# Patient Record
Sex: Female | Born: 1995
Health system: Southern US, Community
[De-identification: ages and names within clinical notes are randomized; demographics above are authoritative.]

## PROBLEM LIST (undated history)

## (undated) ENCOUNTER — Emergency Department: Admission: EM | Payer: BLUE CROSS/BLUE SHIELD | Source: Home / Self Care

## (undated) ENCOUNTER — Inpatient Hospital Stay: Payer: Self-pay

## (undated) DIAGNOSIS — A749 Chlamydial infection, unspecified: Secondary | ICD-10-CM

## (undated) DIAGNOSIS — F53 Postpartum depression: Secondary | ICD-10-CM

## (undated) DIAGNOSIS — R0600 Dyspnea, unspecified: Secondary | ICD-10-CM

## (undated) DIAGNOSIS — O98819 Other maternal infectious and parasitic diseases complicating pregnancy, unspecified trimester: Secondary | ICD-10-CM

## (undated) DIAGNOSIS — F419 Anxiety disorder, unspecified: Secondary | ICD-10-CM

## (undated) DIAGNOSIS — O99345 Other mental disorders complicating the puerperium: Secondary | ICD-10-CM

## (undated) DIAGNOSIS — Z7289 Other problems related to lifestyle: Secondary | ICD-10-CM

## (undated) HISTORY — DX: Other maternal infectious and parasitic diseases complicating pregnancy, unspecified trimester: O98.819

## (undated) HISTORY — PX: TONSILLECTOMY: SUR1361

## (undated) HISTORY — DX: Postpartum depression: F53.0

## (undated) HISTORY — DX: Other mental disorders complicating the puerperium: O99.345

## (undated) HISTORY — DX: Chlamydial infection, unspecified: A74.9

## (undated) HISTORY — DX: Other problems related to lifestyle: Z72.89

## (undated) HISTORY — DX: Anxiety disorder, unspecified: F41.9

---
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
## 2015-08-13 ENCOUNTER — Emergency Department (HOSPITAL_COMMUNITY)
Admission: EM | Admit: 2015-08-13 | Discharge: 2015-08-13 | Disposition: A | Discharge status: Home / Self Care | Payer: BLUE CROSS/BLUE SHIELD | Attending: Emergency Medicine | Admitting: Emergency Medicine

## 2015-08-13 ENCOUNTER — Encounter (HOSPITAL_COMMUNITY): Payer: Self-pay | Admitting: *Deleted

## 2015-08-13 DIAGNOSIS — R1013 Epigastric pain: Secondary | ICD-10-CM | POA: Diagnosis not present

## 2015-08-13 LAB — URINALYSIS, ROUTINE W REFLEX MICROSCOPIC
Bilirubin Urine: NEGATIVE
Glucose, UA: NEGATIVE mg/dL
Hgb urine dipstick: NEGATIVE
KETONES UR: NEGATIVE mg/dL
LEUKOCYTES UA: NEGATIVE
NITRITE: NEGATIVE
PH: 6 (ref 5.0–8.0)
Protein, ur: NEGATIVE mg/dL
SPECIFIC GRAVITY, URINE: 1.025 (ref 1.005–1.030)

## 2015-08-13 LAB — PREGNANCY, URINE: PREG TEST UR: NEGATIVE

## 2015-08-13 MED ORDER — OMEPRAZOLE 20 MG PO CPDR: 20.0000 mg | DELAYED_RELEASE_CAPSULE | Freq: Every day | ORAL | Status: DC

## 2015-08-13 MED ORDER — PANTOPRAZOLE SODIUM 40 MG PO TBEC
40.0000 mg | DELAYED_RELEASE_TABLET | Freq: Every day | ORAL | Status: DC
  Administered 2015-08-13: 40 mg via ORAL
  Filled 2015-08-13: qty 1

## 2015-08-13 MED ORDER — PANTOPRAZOLE SODIUM 20 MG PO TBEC
20.0000 mg | DELAYED_RELEASE_TABLET | Freq: Every day | ORAL | Status: DC
  Filled 2015-08-13: qty 1

## 2015-08-13 MED ORDER — GI COCKTAIL ~~LOC~~
30.0000 mL | Freq: Once | ORAL | Status: AC
  Administered 2015-08-13: 30 mL via ORAL
  Filled 2015-08-13: qty 30

## 2015-08-13 NOTE — Discharge Instructions (Signed)
You were seen today for abdominal pain. Your exam is reassuring. You did not have any persistent pain while in the emergency room. Given where your pain is, he may have some mild gastritis or reflux. This also could be the beginning of a viral illness. Placed on an acid reducer. If you develop any new or worsening symptoms she needs to be reevaluated.  Abdominal Pain, Adult Many things can cause abdominal pain. Usually, abdominal pain is not caused by a disease and will improve without treatment. It can often be observed and treated at home. Your health care provider will do a physical exam and possibly order blood tests and X-rays to help determine the seriousness of your pain. However, in many cases, more time must pass before a clear cause of the pain can be found. Before that point, your health care provider may not know if you need more testing or further treatment. HOME CARE INSTRUCTIONS Monitor your abdominal pain for any changes. The following actions may help to alleviate any discomfort you are experiencing:  Only take over-the-counter or prescription medicines as directed by your health care provider.  Do not take laxatives unless directed to do so by your health care provider.  Try a clear liquid diet (broth, tea, or water) as directed by your health care provider. Slowly move to a bland diet as tolerated. SEEK MEDICAL CARE IF:  You have unexplained abdominal pain.  You have abdominal pain associated with nausea or diarrhea.  You have pain when you urinate or have a bowel movement.  You experience abdominal pain that wakes you in the night.  You have abdominal pain that is worsened or improved by eating food.  You have abdominal pain that is worsened with eating fatty foods.  You have a fever. SEEK IMMEDIATE MEDICAL CARE IF:  Your pain does not go away within 2 hours.  You keep throwing up (vomiting).  Your pain is felt only in portions of the abdomen, such as the right  side or the left lower portion of the abdomen.  You pass bloody or black tarry stools. MAKE SURE YOU:  Understand these instructions.  Will watch your condition.  Will get help right away if you are not doing well or get worse.   This information is not intended to replace advice given to you by your health care provider. Make sure you discuss any questions you have with your health care provider.   Document Released: 01/13/2005 Document Revised: 12/25/2014 Document Reviewed: 12/13/2012 Elsevier Interactive Patient Education [employer]2016 Elsevier Inc.

## 2015-08-13 NOTE — ED Provider Notes (Signed)
CSN: [id number]     Arrival date & time 08/13/15  0130 History   First MD Initiated Contact with Patient 08/13/15 0146     Chief Complaint  Patient presents with  . Abdominal Pain     (Consider location/radiation/quality/duration/timing/severity/associated sxs/prior Treatment) HPI  This is a 20 year old female who presents with epigastric pain. Patient reports onset of symptoms 2-3 hours prior to arrival. She reports crampy upper abdominal pain that is in the midline. It is not associated with eating. It comes and goes. It lasts minutes. She has not taken anything at home for pain. Currently she is without pain. She denies any nausea, vomiting, diarrhea. She denies any fevers. Denies similar symptoms in the past.  History reviewed. No pertinent past medical history. Past Surgical History  Procedure Laterality Date  . Tonsillectomy     History reviewed. No pertinent family history. Social History  Substance Use Topics  . Smoking status: Never Smoker   . Smokeless tobacco: None  . Alcohol Use: No   OB History    No data available     Review of Systems  Constitutional: Negative for fever.  Respiratory: Negative for shortness of breath.   Cardiovascular: Negative for chest pain.  Gastrointestinal: Positive for abdominal pain. Negative for nausea, vomiting and diarrhea.  Genitourinary: Negative for dysuria.  All other systems reviewed and are negative.     Allergies  Amoxicillin  Home Medications   Prior to Admission medications   Medication Sig Start Date End Date Taking? Authorizing Provider  omeprazole (PRILOSEC) 20 MG capsule Take 1 capsule (20 mg total) by mouth daily. 08/13/15   Shon Batonourtney F Lamaj Metoyer, MD   BP 104/64 mmHg  Pulse 69  Temp(Src) 97.7 F (36.5 C)  Resp 18  Ht 5\' 1"  (1.549 m)  Wt 200 lb (90.719 kg)  BMI 37.81 kg/m2  SpO2 99%  LMP 07/09/2015 Physical Exam  Constitutional: She is oriented to person, place, and time. No distress.  Overweight  HENT:   Head: Normocephalic and atraumatic.  Cardiovascular: Normal rate, regular rhythm and normal heart sounds.   No murmur heard. Pulmonary/Chest: Effort normal and breath sounds normal. No respiratory distress. She has no wheezes.  Abdominal: Soft. Bowel sounds are normal. There is no tenderness. There is no rebound and no guarding.  Neurological: She is alert and oriented to person, place, and time.  Skin: Skin is warm and dry.  Psychiatric: She has a normal mood and affect.  Nursing note and vitals reviewed.   ED Course  Procedures (including critical care time) Labs Review Labs Reviewed  URINALYSIS, ROUTINE W REFLEX MICROSCOPIC (NOT [facility])  PREGNANCY, URINE    Imaging Review No results found. I have personally reviewed and evaluated these images and lab results as part of my medical decision-making.   EKG Interpretation None      MDM   Final diagnoses:  Epigastric pain    Patient presents with crampy upper abdominal pain. She is nontoxic on exam. Exam is benign. No reproducible abdominal tenderness. Vital signs are reassuring. Considerations include gastritis, GERD, or less likely biliary colic or pancreatitis. Discussed with patient possible plans of workup including screening urinalysis and urine pregnancy and a by mouth challenge versus screening lab work. Patient would like to defer lab work at this time. Urinalysis without evidence of dehydration. Urine pregnancy is negative.  Patient was given protonic and a GI cocktail. She states that while she has been in the emergency department she has had one episode of  pain that came and went. She continues to have a nontender abdomen. Discussed with patient and her mother considerations. They were given follow-up information and strict return precautions.  After history, exam, and medical workup I feel the patient has been appropriately medically screened and is safe for discharge home. Pertinent diagnoses were discussed with the  patient. Patient was given return precautions.     Shon Baton, MD 08/13/15 [phone]

## 2015-08-13 NOTE — ED Notes (Signed)
Pt c/o epigastric abdominal pain that started a couple of hours ago; pt denies any n/v/d or fevers

## 2016-08-27 ENCOUNTER — Emergency Department: Payer: BLUE CROSS/BLUE SHIELD

## 2016-08-27 ENCOUNTER — Encounter: Payer: Self-pay | Admitting: Emergency Medicine

## 2016-08-27 ENCOUNTER — Emergency Department
Admission: EM | Admit: 2016-08-27 | Discharge: 2016-08-27 | Disposition: A | Discharge status: Home / Self Care | Payer: BLUE CROSS/BLUE SHIELD | Attending: Emergency Medicine | Admitting: Emergency Medicine

## 2016-08-27 DIAGNOSIS — R0789 Other chest pain: Secondary | ICD-10-CM

## 2016-08-27 DIAGNOSIS — F419 Anxiety disorder, unspecified: Secondary | ICD-10-CM

## 2016-08-27 DIAGNOSIS — R079 Chest pain, unspecified: Secondary | ICD-10-CM | POA: Diagnosis not present

## 2016-08-27 LAB — URINALYSIS, COMPLETE (UACMP) WITH MICROSCOPIC
BILIRUBIN URINE: NEGATIVE
Bacteria, UA: NONE SEEN
Glucose, UA: NEGATIVE mg/dL
HGB URINE DIPSTICK: NEGATIVE
KETONES UR: NEGATIVE mg/dL
Leukocytes, UA: NEGATIVE
Nitrite: NEGATIVE
PROTEIN: NEGATIVE mg/dL
RBC / HPF: NONE SEEN RBC/hpf (ref 0–5)
Specific Gravity, Urine: 1.016 (ref 1.005–1.030)
pH: 6 (ref 5.0–8.0)

## 2016-08-27 LAB — CBC
HEMATOCRIT: 43.4 % (ref 35.0–47.0)
HEMOGLOBIN: 15 g/dL (ref 12.0–16.0)
MCH: 30.4 pg (ref 26.0–34.0)
MCHC: 34.5 g/dL (ref 32.0–36.0)
MCV: 88 fL (ref 80.0–100.0)
Platelets: 259 10*3/uL (ref 150–440)
RBC: 4.93 MIL/uL (ref 3.80–5.20)
RDW: 12.9 % (ref 11.5–14.5)
WBC: 8.9 10*3/uL (ref 3.6–11.0)

## 2016-08-27 LAB — BASIC METABOLIC PANEL
ANION GAP: 5 (ref 5–15)
BUN: 11 mg/dL (ref 6–20)
CO2: 21 mmol/L — AB (ref 22–32)
Calcium: 8.6 mg/dL — ABNORMAL LOW (ref 8.9–10.3)
Chloride: 109 mmol/L (ref 101–111)
Creatinine, Ser: 0.71 mg/dL (ref 0.44–1.00)
GFR calc Af Amer: >60 mL/min (ref >60)
GLUCOSE: 91 mg/dL (ref 65–99)
POTASSIUM: 3.7 mmol/L (ref 3.5–5.1)
Sodium: 135 mmol/L (ref 135–145)

## 2016-08-27 LAB — TROPONIN I: Troponin I: <0.03 ng/mL (ref <0.03)

## 2016-08-27 LAB — POCT PREGNANCY, URINE: PREG TEST UR: NEGATIVE

## 2016-08-27 MED ORDER — ALUMINUM-MAGNESIUM-SIMETHICONE 200-200-20 MG/5ML PO SUSP: 30.0000 mL | Freq: Three times a day (TID) | ORAL | 0 refills | Status: DC

## 2016-08-27 MED ORDER — RANITIDINE HCL 150 MG PO CAPS: 150.0000 mg | ORAL_CAPSULE | Freq: Two times a day (BID) | ORAL | 0 refills | Status: DC

## 2016-08-27 NOTE — ED Triage Notes (Signed)
Pt states that she has had chest pain x2 days with dizziness since this AM. Pt also states that she has had episodes of dizziness x1 year. Pt denies LOC, N/V, or any other cardiac or neuro symptoms. Pt does report that she has been increasingly thirsty and having to urinate more frequently. Pt is ambulatory to triage with NAD at this time.

## 2016-08-27 NOTE — ED Provider Notes (Signed)
[facility] Emergency Department Provider Note  ____________________________________________  Time seen: Approximately 8:32 PM  I have reviewed the triage vital signs and the nursing notes.   HISTORY  Chief Complaint Chest Pain    HPI Casey PetrinKaitlyn M Goldston is a 21 y.o. female who complains of chest pain for the past 2 days. It is central, described as sharp, intermittent lasting a minute or 2 at a time. Also radiating to the right shoulder. No shortness of breath diaphoresis or vomiting. Not exertional nor pleuritic. Worse when she doesn't eat. No other aggravating or alleviating factors. Does feel very stressed related to work because of a conflict with her supervisor.     History reviewed. No pertinent past medical history.   There are no active problems to display for this patient.    Past Surgical History:  Procedure Laterality Date  . TONSILLECTOMY       Prior to Admission medications   Medication Sig Start Date End Date Taking? Authorizing Provider  aluminum-magnesium hydroxide-simethicone (MAALOX) 200-200-20 MG/5ML SUSP Take 30 mLs by mouth 4 (four) times daily -  before meals and at bedtime. 08/27/16   Sharman CheekStafford, Rosser Collington, MD  omeprazole (PRILOSEC) 20 MG capsule Take 1 capsule (20 mg total) by mouth daily. 08/13/15   Horton, Mayer Maskerourtney F, MD  ranitidine (ZANTAC) 150 MG capsule Take 1 capsule (150 mg total) by mouth 2 (two) times daily. 08/27/16   Sharman CheekStafford, Kimbley Sprague, MD     Allergies Amoxicillin   No family history on file.  Social History Social History  Substance Use Topics  . Smoking status: Never Smoker  . Smokeless tobacco: Never Used  . Alcohol use No    Review of Systems  Constitutional:   No fever or chills.  ENT:   No sore throat. No rhinorrhea. Lymphatic: No swollen glands, No extremity swelling Endocrine: No hot/cold flashes. No significant weight change. No neck swelling. Cardiovascular:   Positive as above for chest  pain. Respiratory:   No dyspnea or cough. Gastrointestinal:   Negative for abdominal pain, vomiting and diarrhea.  Genitourinary:   Negative for dysuria or difficulty urinating. Musculoskeletal:   Negative for focal pain or swelling Neurological:   Negative for headaches or weakness. All other systems reviewed and are negative except as documented above in ROS and HPI.  ____________________________________________   PHYSICAL EXAM:  VITAL SIGNS: ED Triage Vitals  Enc Vitals Group     BP 08/27/16 1908 (!) 99/58     Pulse Rate 08/27/16 1908 67     Resp 08/27/16 1908 18     Temp 08/27/16 1908 98.4 F (36.9 C)     Temp Source 08/27/16 1908 Oral     SpO2 08/27/16 1908 97 %     Weight 08/27/16 1912 200 lb (90.7 kg)     Height 08/27/16 1912 5\' 1"  (1.549 m)     Head Circumference --      Peak Flow --      Pain Score 08/27/16 1908 6     Pain Loc --      Pain Edu? --      Excl. in GC? --     Vital signs reviewed, nursing assessments reviewed.   Constitutional:   Alert and oriented. Well appearing and in no distress. Eyes:   No scleral icterus. No conjunctival pallor. PERRL. EOMI.  No nystagmus. ENT   Head:   Normocephalic and atraumatic.   Nose:   No congestion/rhinnorhea. No septal hematoma   Mouth/Throat:  MMM, no pharyngeal erythema. No peritonsillar mass.    Neck:   No stridor. No SubQ emphysema. No meningismus. Hematological/Lymphatic/Immunilogical:   No cervical lymphadenopathy. Cardiovascular:   RRR. Symmetric bilateral radial and DP pulses.  No murmurs.  Respiratory:   Normal respiratory effort without tachypnea nor retractions. Breath sounds are clear and equal bilaterally. No wheezes/rales/rhonchi.Chest wall nontender Gastrointestinal:   Soft and nontender. Non distended. There is no CVA tenderness.  No rebound, rigidity, or guarding. Genitourinary:   deferred Musculoskeletal:   Normal range of motion in all extremities. No joint effusions.  No lower  extremity tenderness.  No edema. Neurologic:   Normal speech and language.  CN 2-10 normal. Motor grossly intact. No gross focal neurologic deficits are appreciated.  Skin:    Skin is warm, dry and intact. No rash noted.  No petechiae, purpura, or bullae.  ____________________________________________    LABS (pertinent positives/negatives) (all labs ordered are listed, but only abnormal results are displayed) Labs Reviewed  BASIC METABOLIC PANEL - Abnormal; Notable for the following:       Result Value   CO2 21 (*)    Calcium 8.6 (*)    All other components within normal limits  URINALYSIS, COMPLETE (UACMP) WITH MICROSCOPIC - Abnormal; Notable for the following:    Color, Urine YELLOW (*)    APPearance CLEAR (*)    Squamous Epithelial / LPF 0-5 (*)    All other components within normal limits  CBC  TROPONIN I  POCT PREGNANCY, URINE   ____________________________________________   EKG  Interpreted by me Sinus rhythm rate of 70, normal axis and intervals. Normal QRS ST segments and T waves.  ____________________________________________    RADIOLOGY  Dg Chest 2 View  Result Date: 08/27/2016 CLINICAL DATA:  Chest pain EXAM: CHEST  2 VIEW COMPARISON:  None. FINDINGS: Normal heart size and mediastinal contours. No acute infiltrate or edema. No effusion or pneumothorax. No acute osseous findings. IMPRESSION: Negative chest. Electronically Signed   By: Marnee Spring M.D.   On: 08/27/2016 19:45    ____________________________________________   PROCEDURES Procedures  ____________________________________________   INITIAL IMPRESSION / ASSESSMENT AND PLAN / ED COURSE  Pertinent labs & imaging results that were available during my care of the patient were reviewed by me and considered in my medical decision making (see chart for details).  Patient well appearing no acute distress presents with noncardiac atypical chest pain.Considering the patient's symptoms, medical  history, and physical examination today, I have low suspicion for ACS, PE, TAD, pneumothorax, carditis, mediastinitis, pneumonia, CHF, or sepsis.  She does feel like she was anxious at the time, and it may be anxiety related versus acid reflux. Trial of antacids, follow up with primary care doctor.         ____________________________________________   FINAL CLINICAL IMPRESSION(S) / ED DIAGNOSES  Final diagnoses:  Atypical chest pain  Anxiety      New Prescriptions   ALUMINUM-MAGNESIUM HYDROXIDE-SIMETHICONE (MAALOX) 200-200-20 MG/5ML SUSP    Take 30 mLs by mouth 4 (four) times daily -  before meals and at bedtime.   RANITIDINE (ZANTAC) 150 MG CAPSULE    Take 1 capsule (150 mg total) by mouth 2 (two) times daily.     Portions of this note were generated with dragon dictation software. Dictation errors may occur despite best attempts at proofreading.    Sharman Cheek, MD 08/27/16 2034

## 2016-12-15 ENCOUNTER — Ambulatory Visit (INDEPENDENT_AMBULATORY_CARE_PROVIDER_SITE_OTHER): Payer: BLUE CROSS/BLUE SHIELD | Admitting: Obstetrics and Gynecology

## 2016-12-15 ENCOUNTER — Encounter: Payer: Self-pay | Admitting: Obstetrics and Gynecology

## 2016-12-15 VITALS — BP 98/64 | HR 72 | Ht 61.0 in | Wt 232.1 lb

## 2016-12-15 DIAGNOSIS — N912 Amenorrhea, unspecified: Secondary | ICD-10-CM | POA: Diagnosis not present

## 2016-12-15 LAB — POCT URINALYSIS DIPSTICK
BILIRUBIN UA: NEGATIVE
Glucose, UA: NEGATIVE
KETONES UA: NEGATIVE
Leukocytes, UA: NEGATIVE
NITRITE UA: NEGATIVE
PH UA: 5 (ref 5.0–8.0)
PROTEIN UA: NEGATIVE
RBC UA: NEGATIVE
Spec Grav, UA: 1.01 (ref 1.010–1.025)
Urobilinogen, UA: 0.2 E.U./dL

## 2016-12-15 LAB — POCT URINE PREGNANCY: PREG TEST UR: POSITIVE — AB

## 2016-12-15 NOTE — Progress Notes (Signed)
HPI:      Casey Fuentes is a 21 y.o. No obstetric history on file. who LMP was Patient's last menstrual period was 10/17/2016 (approximate).  Subjective:   She presents today After having a positive pregnant test this weekend. She complains of some deep pelvic cramping she denies vaginal bleeding. She is currently not taking prenatal vitamins. This was an unplanned pregnancy.    Hx: The following portions of the patient's history were reviewed and updated as appropriate:             She  has no past medical history on file. She  does not have a problem list on file. She  has a past surgical history that includes Tonsillectomy. Her family history includes Diabetes in her mother; Thyroid disease in her mother. She  reports that she has never smoked. She has never used smokeless tobacco. She reports that she does not drink alcohol or use drugs. She is allergic to amoxicillin.       Review of Systems:  Review of Systems  Constitutional: Denied constitutional symptoms, night sweats, recent illness, fatigue, fever, insomnia and weight loss.  Eyes: Denied eye symptoms, eye pain, photophobia, vision change and visual disturbance.  Ears/Nose/Throat/Neck: Denied ear, nose, throat or neck symptoms, hearing loss, nasal discharge, sinus congestion and sore throat.  Cardiovascular: Denied cardiovascular symptoms, arrhythmia, chest pain/pressure, edema, exercise intolerance, orthopnea and palpitations.  Respiratory: Denied pulmonary symptoms, asthma, pleuritic pain, productive sputum, cough, dyspnea and wheezing.  Gastrointestinal: Denied, gastro-esophageal reflux, melena, nausea and vomiting.  Genitourinary: Denied genitourinary symptoms including symptomatic vaginal discharge, pelvic relaxation issues, and urinary complaints.  Musculoskeletal: Denied musculoskeletal symptoms, stiffness, swelling, muscle weakness and myalgia.  Dermatologic: Denied dermatology symptoms, rash and scar.   Neurologic: Denied neurology symptoms, dizziness, headache, neck pain and syncope.  Psychiatric: Denied psychiatric symptoms, anxiety and depression.  Endocrine: Denied endocrine symptoms including hot flashes and night sweats.   Meds:   Current Outpatient Prescriptions on File Prior to Visit  Medication Sig Dispense Refill  . aluminum-magnesium hydroxide-simethicone (MAALOX) 200-200-20 MG/5ML SUSP Take 30 mLs by mouth 4 (four) times daily -  before meals and at bedtime. (Patient not taking: Reported on 12/15/2016) 355 mL 0  . omeprazole (PRILOSEC) 20 MG capsule Take 1 capsule (20 mg total) by mouth daily. (Patient not taking: Reported on 12/15/2016) 30 capsule 0  . ranitidine (ZANTAC) 150 MG capsule Take 1 capsule (150 mg total) by mouth 2 (two) times daily. (Patient not taking: Reported on 12/15/2016) 28 capsule 0   No current facility-administered medications on file prior to visit.     Objective:     Vitals:   12/15/16 1458  BP: 98/64  Pulse: 72              Urinary beta hCG positive.  Assessment:    No obstetric history on file. There are no active problems to display for this patient.    1. Amenorrhea     Patient is pregnant with unknown menstrual dating.  Some pelvic cramping without bleeding (mild) likely uterine enlargement.   Plan:            Prenatal Plan 1.  The patient was given prenatal literature. 2.  She was begun on prenatal vitamins. 3.  A prenatal lab panel was ordered or drawn. 4.  An ultrasound was ordered to better determine an EDC. 5.  A nurse visit was scheduled. 6.  Miscarriage warnings given.   Orders Orders Placed This Encounter  Procedures  . US OB Comp Less 14 Wks  . POCT urine pregnancy    No orders of the defined types were placed in this encounter.       F/U  Return in about 4 weeks (around 01/12/2017).  Elonda Husky, M.D. 12/15/2016 3:49 PM

## 2016-12-30 ENCOUNTER — Encounter: Payer: Self-pay | Admitting: Emergency Medicine

## 2016-12-30 ENCOUNTER — Emergency Department
Admission: EM | Admit: 2016-12-30 | Discharge: 2016-12-30 | Disposition: A | Discharge status: Home / Self Care | Payer: BLUE CROSS/BLUE SHIELD | Attending: Emergency Medicine | Admitting: Emergency Medicine

## 2016-12-30 DIAGNOSIS — O26891 Other specified pregnancy related conditions, first trimester: Secondary | ICD-10-CM | POA: Diagnosis not present

## 2016-12-30 DIAGNOSIS — Z3A10 10 weeks gestation of pregnancy: Secondary | ICD-10-CM | POA: Insufficient documentation

## 2016-12-30 DIAGNOSIS — R109 Unspecified abdominal pain: Secondary | ICD-10-CM | POA: Insufficient documentation

## 2016-12-30 LAB — URINALYSIS, COMPLETE (UACMP) WITH MICROSCOPIC
Bacteria, UA: NONE SEEN
Bilirubin Urine: NEGATIVE
Glucose, UA: NEGATIVE mg/dL
Hgb urine dipstick: NEGATIVE
Ketones, ur: NEGATIVE mg/dL
Leukocytes, UA: NEGATIVE
Nitrite: NEGATIVE
Protein, ur: NEGATIVE mg/dL
SPECIFIC GRAVITY, URINE: 1.016 (ref 1.005–1.030)
pH: 6 (ref 5.0–8.0)

## 2016-12-30 LAB — COMPREHENSIVE METABOLIC PANEL
ALT: 13 U/L — AB (ref 14–54)
AST: 17 U/L (ref 15–41)
Albumin: 3.4 g/dL — ABNORMAL LOW (ref 3.5–5.0)
Alkaline Phosphatase: 57 U/L (ref 38–126)
Anion gap: 6 (ref 5–15)
BUN: 9 mg/dL (ref 6–20)
CHLORIDE: 106 mmol/L (ref 101–111)
CO2: 22 mmol/L (ref 22–32)
Calcium: 8.7 mg/dL — ABNORMAL LOW (ref 8.9–10.3)
Creatinine, Ser: 0.47 mg/dL (ref 0.44–1.00)
GFR calc Af Amer: >60 mL/min (ref >60)
GFR calc non Af Amer: >60 mL/min (ref >60)
GLUCOSE: 89 mg/dL (ref 65–99)
Potassium: 3.6 mmol/L (ref 3.5–5.1)
Sodium: 134 mmol/L — ABNORMAL LOW (ref 135–145)
Total Bilirubin: 0.5 mg/dL (ref 0.3–1.2)
Total Protein: 7.9 g/dL (ref 6.5–8.1)

## 2016-12-30 LAB — CBC
HCT: 41.4 % (ref 35.0–47.0)
Hemoglobin: 14.6 g/dL (ref 12.0–16.0)
MCH: 30.5 pg (ref 26.0–34.0)
MCHC: 35.3 g/dL (ref 32.0–36.0)
MCV: 86.3 fL (ref 80.0–100.0)
PLATELETS: 251 10*3/uL (ref 150–440)
RBC: 4.79 MIL/uL (ref 3.80–5.20)
RDW: 13.6 % (ref 11.5–14.5)
WBC: 10.1 10*3/uL (ref 3.6–11.0)

## 2016-12-30 LAB — LIPASE, BLOOD: LIPASE: 19 U/L (ref 11–51)

## 2016-12-30 NOTE — ED Provider Notes (Signed)
[facility] Emergency Department Provider Note   ____________________________________________   I have reviewed the triage vital signs and the nursing notes.   HISTORY  Chief Complaint Abdominal Cramping   History limited by: Not Limited   HPI Casey Fuentes is a 21 y.o. female who presents to the emergency department today because of concerns for abdominal cramping. Patient states she is about [redacted] weeks pregnant by dates. The cramping has been going on for a little while though it is gotten worse past 3 days. It is located primarily on the left side. She describes it as a sensation of someone squeezing her on the inside. She denies any change with urination. She denies any vaginal bleeding or abnormal discharge. No vomiting or diarrhea. No fevers.    History reviewed. No pertinent past medical history.  There are no active problems to display for this patient.   Past Surgical History:  Procedure Laterality Date  . TONSILLECTOMY      Prior to Admission medications   Medication Sig Start Date End Date Taking? Authorizing Provider  aluminum-magnesium hydroxide-simethicone (MAALOX) 200-200-20 MG/5ML SUSP Take 30 mLs by mouth 4 (four) times daily -  before meals and at bedtime. Patient not taking: Reported on 12/15/2016 08/27/16   Sharman Cheek, MD  omeprazole (PRILOSEC) 20 MG capsule Take 1 capsule (20 mg total) by mouth daily. Patient not taking: Reported on 12/15/2016 08/13/15   Horton, Mayer Masker, MD  ranitidine (ZANTAC) 150 MG capsule Take 1 capsule (150 mg total) by mouth 2 (two) times daily. Patient not taking: Reported on 12/15/2016 08/27/16   Sharman Cheek, MD    Allergies Amoxicillin  Family History  Problem Relation Age of Onset  . Diabetes Mother   . Thyroid disease Mother     Social History Social History  Substance Use Topics  . Smoking status: Never Smoker  . Smokeless tobacco: Never Used  . Alcohol use No    Review of  Systems Constitutional: No fever/chills Eyes: No visual changes. ENT: No sore throat. Cardiovascular: Denies chest pain. Respiratory: Denies shortness of breath. Gastrointestinal: Positive for abdominal cramping.  Genitourinary: Negative for dysuria. Musculoskeletal: Negative for back pain. Skin: Negative for rash. Neurological: Negative for headaches, focal weakness or numbness.  ____________________________________________   PHYSICAL EXAM:  VITAL SIGNS: ED Triage Vitals  Enc Vitals Group     BP 12/30/16 1906 94/67     Pulse Rate 12/30/16 1906 75     Resp 12/30/16 1906 18     Temp 12/30/16 1906 98.8 F (37.1 C)     Temp Source 12/30/16 1906 Oral     SpO2 12/30/16 1906 100 %     Weight 12/30/16 1904 230 lb (104.3 kg)     Height 12/30/16 1904  (1.549 m)     Head Circumference --      Peak Flow --      Pain Score 12/30/16 1903 6   Constitutional: Alert and oriented. Well appearing and in no distress. Eyes: Conjunctivae are normal.  ENT   Head: Normocephalic and atraumatic.   Nose: No congestion/rhinnorhea.   Mouth/Throat: Mucous membranes are moist.   Neck: No stridor. Hematological/Lymphatic/Immunilogical: No cervical lymphadenopathy. Cardiovascular: Normal rate, regular rhythm.  No murmurs, rubs, or gallops. Respiratory: Normal respiratory effort without tachypnea nor retractions. Breath sounds are clear and equal bilaterally. No wheezes/rales/rhonchi. Gastrointestinal: Soft and non tender. No rebound. No guarding.  Genitourinary: Deferred Musculoskeletal: Normal range of motion in all extremities. No lower extremity edema. Neurologic:  Normal speech and language. No gross focal neurologic deficits are appreciated.  Skin:  Skin is warm, dry and intact. No rash noted. Psychiatric: Mood and affect are normal. Speech and behavior are normal. Patient exhibits appropriate insight and judgment.  ____________________________________________    LABS  (pertinent positives/negatives)  Labs Reviewed  COMPREHENSIVE METABOLIC PANEL - Abnormal; Notable for the following:       Result Value   Sodium 134 (*)    Calcium 8.7 (*)    Albumin 3.4 (*)    ALT 13 (*)    All other components within normal limits  URINALYSIS, COMPLETE (UACMP) WITH MICROSCOPIC - Abnormal; Notable for the following:    Color, Urine YELLOW (*)    APPearance CLEAR (*)    Squamous Epithelial / LPF 0-5 (*)    All other components within normal limits  LIPASE, BLOOD  CBC     ____________________________________________   EKG  None  ____________________________________________    RADIOLOGY  None  ____________________________________________   PROCEDURES  Procedures  ____________________________________________   INITIAL IMPRESSION / ASSESSMENT AND PLAN / ED COURSE  Pertinent labs & imaging results that were available during my care of the patient were reviewed by me and considered in my medical decision making (see chart for details).  patient presented to the emergency department today because of concerns for abdominal pain. Patient is roughly [redacted] weeks pregnant. Bedside ultrasound shows a live single intrauterine pregnancy. Blood work and urine without concerning findings. At this point I think benign etiology of the pain. Do not feel any further emergent workup is necessary. While patient follow-up with her OB/GYN doctor.  ____________________________________________   FINAL CLINICAL IMPRESSION(S) / ED DIAGNOSES  Final diagnoses:  Abdominal pain during pregnancy in first trimester     Note: This dictation was prepared with Dragon dictation. Any transcriptional errors that result from this process are unintentional     Phineas SemenGoodman, Paiden Cavell, MD 12/30/16 2055

## 2016-12-30 NOTE — ED Triage Notes (Signed)
Patient states that she is about [redacted] weeks pregnant. Patient states that she has had lower abdominal cramping throughout her pregnancy but that the cramping has become worse over the last 2 days. Patient denies any vaginal bleeding.

## 2016-12-30 NOTE — Discharge Instructions (Signed)
Please seek medical attention for any high fevers, chest pain, shortness of breath, change in behavior, persistent vomiting, bloody stool or any other new or concerning symptoms.  

## 2016-12-30 NOTE — ED Notes (Signed)
Pt reports recently found out she is pregnant will have first apt with OBGYN Alanson Aly([facility]) 9/28

## 2016-12-30 NOTE — ED Notes (Signed)
Pt verbalizes understanding of discharge instructions.

## 2016-12-30 NOTE — ED Notes (Signed)
MD Derrill KayGoodman and this RN at bedside

## 2016-12-30 NOTE — ED Notes (Signed)
Dr. Goodman at bedside.  

## 2017-01-03 ENCOUNTER — Other Ambulatory Visit: Payer: BLUE CROSS/BLUE SHIELD

## 2017-01-12 ENCOUNTER — Encounter: Payer: BLUE CROSS/BLUE SHIELD | Admitting: Obstetrics and Gynecology

## 2017-01-14 ENCOUNTER — Ambulatory Visit: Payer: BLUE CROSS/BLUE SHIELD

## 2017-01-14 ENCOUNTER — Ambulatory Visit (INDEPENDENT_AMBULATORY_CARE_PROVIDER_SITE_OTHER): Payer: BLUE CROSS/BLUE SHIELD | Admitting: Obstetrics and Gynecology

## 2017-01-14 VITALS — BP 101/68 | HR 72 | Ht 61.0 in | Wt 236.9 lb

## 2017-01-14 DIAGNOSIS — Z1389 Encounter for screening for other disorder: Secondary | ICD-10-CM | POA: Diagnosis not present

## 2017-01-14 DIAGNOSIS — Z113 Encounter for screening for infections with a predominantly sexual mode of transmission: Secondary | ICD-10-CM | POA: Diagnosis not present

## 2017-01-14 DIAGNOSIS — N912 Amenorrhea, unspecified: Secondary | ICD-10-CM

## 2017-01-14 DIAGNOSIS — Z3401 Encounter for supervision of normal first pregnancy, first trimester: Secondary | ICD-10-CM

## 2017-01-14 DIAGNOSIS — E669 Obesity, unspecified: Secondary | ICD-10-CM | POA: Diagnosis not present

## 2017-01-14 NOTE — Progress Notes (Signed)
Casey Fuentes presents for NOB nurse interview visit. Pregnancy confirmation done on 12/15/16, seen by Dr. Logan Bores, UPT: positive.  Pt had ED visit on 12/30/2016 due to abdominal cramping but has no complaints of abdominal cramping or vaginal bleeding today  And none since ED visit. Ultrasound at ED visit estimated 10wk live single intrauterine pregnancy. G-1.  P-0. Pregnancy education material explained and given. No cats in the home. NOB labs ordered. TSH/HbgA1c due to Increased BMI-37.8. HIV labs and Drug screen were explained optional and she did not decline. Drug screen ordered. PNV encouraged. Genetic screening options discussed. Genetic testing: Unsure. Pt to contact insurance for genetic test cost to pt.  Pt may discuss with provider further. Pt. To follow up with provider in 1 week with Dr. Logan Bores for NOB physical.  All questions answered. Ultrasound for dating and viability due to unsure of LMP today.   I agree with the above report.  Elonda Husky, M.D. 04/26/2017 10:25 AM

## 2017-01-14 NOTE — Patient Instructions (Signed)
Braxton Hicks Contractions Contractions of the uterus can occur throughout pregnancy, but they are not always a sign that you are in labor. You may have practice contractions called Braxton Hicks contractions. These false labor contractions are sometimes confused with true labor. What are Braxton Hicks contractions? Braxton Hicks contractions are tightening movements that occur in the muscles of the uterus before labor. Unlike true labor contractions, these contractions do not result in opening (dilation) and thinning of the cervix. Toward the end of pregnancy (32-34 weeks), Braxton Hicks contractions can happen more often and may become stronger. These contractions are sometimes difficult to tell apart from true labor because they can be very uncomfortable. You should not feel embarrassed if you go to the hospital with false labor. Sometimes, the only way to tell if you are in true labor is for your health care provider to look for changes in the cervix. The health care provider will do a physical exam and may monitor your contractions. If you are not in true labor, the exam should show that your cervix is not dilating and your water has not broken. If there are no prenatal problems or other health problems associated with your pregnancy, it is completely safe for you to be sent home with false labor. You may continue to have Braxton Hicks contractions until you go into true labor. How can I tell the difference between true labor and false labor?  Differences ? False labor ? Contractions last 30-70 seconds.: Contractions are usually shorter and not as strong as true labor contractions. ? Contractions become very regular.: Contractions are usually irregular. ? Discomfort is usually felt in the top of the uterus, and it spreads to the lower abdomen and low back.: Contractions are often felt in the front of the lower abdomen and in the groin. ? Contractions do not go away with walking.: Contractions may  go away when you walk around or change positions while lying down. ? Contractions usually become more intense and increase in frequency.: Contractions get weaker and are shorter-lasting as time goes on. ? The cervix dilates and gets thinner.: The cervix usually does not dilate or become thin. Follow these instructions at home:  Take over-the-counter and prescription medicines only as told by your health care provider.  Keep up with your usual exercises and follow other instructions from your health care provider.  Eat and drink lightly if you think you are going into labor.  If Braxton Hicks contractions are making you uncomfortable: ? Change your position from lying down or resting to walking, or change from walking to resting. ? Sit and rest in a tub of warm water. ? Drink enough fluid to keep your urine clear or pale yellow. Dehydration may cause these contractions. ? Do slow and deep breathing several times an hour.  Keep all follow-up prenatal visits as told by your health care provider. This is important. Contact a health care provider if:  You have a fever.  You have continuous pain in your abdomen. Get help right away if:  Your contractions become stronger, more regular, and closer together.  You have fluid leaking or gushing from your vagina.  You pass blood-tinged mucus (bloody show).  You have bleeding from your vagina.  You have low back pain that you never had before.  You feel your baby's head pushing down and causing pelvic pressure.  Your baby is not moving inside you as much as it used to. Summary  Contractions that occur before labor are   called Braxton Hicks contractions, false labor, or practice contractions.  Braxton Hicks contractions are usually shorter, weaker, farther apart, and less regular than true labor contractions. True labor contractions usually become progressively stronger and regular and they become more frequent.  Manage discomfort from  [facility] contractions by changing position, resting in a warm bath, drinking plenty of water, or practicing deep breathing. This information is not intended to replace advice given to you by your health care provider. Make sure you discuss any questions you have with your health care provider. Document Released: 04/05/2005 Document Revised: 02/23/2016 Document Reviewed: 02/23/2016 Elsevier Interactive Patient Education  2017 [employer] Pregnancy and Zika Virus Disease Zika virus disease, or Zika, is an illness that can spread to people from mosquitoes that carry the virus. It may also spread from person to person through infected body fluids. Zika first occurred in [place], but recently it has spread to new areas. The virus occurs in tropical climates. The location of Zika continues to change. Most people who become infected with Zika virus do not develop serious illness. However, Zika may cause birth defects in an unborn baby whose mother is infected with the virus. It may also increase the risk of miscarriage. What are the symptoms of Zika virus disease? In many cases, people who have been infected with Zika virus do not develop any symptoms. If symptoms appear, they usually start about a week after the person is infected. Symptoms are usually mild. They may include:  Fever.  Rash.  Red eyes.  Joint pain.  How does Zika virus disease spread? The main way that Zika virus spreads is through the bite of a certain type of mosquito. Unlike most types of mosquitos, which bite only at night, the type of mosquito that carries Zika virus bites both at night and during the day. Zika virus can also spread through sexual contact, through a blood transfusion, and from a mother to her baby before or during birth. Once you have had Zika virus disease, it is unlikely that you will get it again. Can I pass Zika to my baby during pregnancy? Yes, Zika can pass from a mother to her baby before or during  birth. What problems can Zika cause for my baby? A woman who is infected with Zika virus while pregnant is at risk of having her baby born with a condition in which the brain or head is smaller than expected (microcephaly). Babies who have microcephaly can have developmental delays, seizures, hearing problems, and vision problems. Having Zika virus disease during pregnancy can also increase the risk of miscarriage. How can Zika virus disease be prevented? There is no vaccine to prevent Zika. The best way to prevent the disease is to avoid infected mosquitoes and avoid exposure to body fluids that can spread the virus. Avoid any possible exposure to Zika by taking the following precautions. For women and their sex partners:  Avoid traveling to high-risk areas. The locations where [place] is being reported change often. To identify high-risk areas, check the CDC travel website: http://davidson-gomez.com/  If you or your sex partner must travel to a high-risk area, talk with a health care provider before and after traveling.  Take all precautions to avoid mosquito bites if you live in, or travel to, any of the high-risk areas. Insect repellents are safe to use during pregnancy.  Ask your health care provider when it is safe to have sexual contact.  For women:  If you are pregnant or trying to  become pregnant, avoid sexual contact with persons who may have been exposed to [place] virus, persons who have possible symptoms of Zika, or persons whose history you are unsure about. If you choose to have sexual contact with someone who may have been exposed to [place] virus, use condoms correctly during the entire duration of sexual activity, every time. Do not share sexual devices, as you may be exposed to body fluids.  Ask your health care provider about when it is safe to attempt pregnancy after a possible exposure to Zika virus.  What steps should I take to avoid mosquito bites? Take these steps to avoid  mosquito bites when you are in a high-risk area:  Wear loose clothing that covers your arms and legs.  Limit your outdoor activities.  Do not open windows unless they have window screens.  Sleep under mosquito nets.  Use insect repellent. The best insect repellents have:  DEET, picaridin, oil of lemon eucalyptus (OLE), or IR3535 in them.  Higher amounts of an active ingredient in them.  Remember that insect repellents are safe to use during pregnancy.  Do not use OLE on children who are younger than 63 years of age. Do not use insect repellent on babies who are younger than 24 months of age.  Cover your child's stroller with mosquito netting. Make sure the netting fits snugly and that any loose netting does not cover your child's mouth or nose. Do not use a blanket as a mosquito-protection cover.  Do not apply insect repellent underneath clothing.  If you are using sunscreen, apply the sunscreen before applying the insect repellent.  Treat clothing with permethrin. Do not apply permethrin directly to your skin. Follow label directions for safe use.  Get rid of standing water, where mosquitoes may reproduce. Standing water is often found in items such as buckets, bowls, animal food dishes, and flowerpots.  When you return from traveling to any high-risk area, continue taking actions to protect yourself against mosquito bites for 3 weeks, even if you show no signs of illness. This will prevent spreading Zika virus to uninfected mosquitoes. What should I know about the sexual transmission of Zika? People can spread Zika to their sexual partners during vaginal, anal, or oral sex, or by sharing sexual devices. Many people with [place] do not develop symptoms, so a person could spread the disease without knowing that they are infected. The greatest risk is to women who are pregnant or who may become pregnant. Zika virus can live longer in semen than it can live in blood. Couples can prevent  sexual transmission of the virus by:  Using condoms correctly during the entire duration of sexual activity, every time. This includes vaginal, anal, and oral sex.  Not sharing sexual devices. Sharing increases your risk of being exposed to body fluid from another person.  Avoiding all sexual activity until your health care provider says it is safe.  Should I be tested for Zika virus? A sample of your blood can be tested for Zika virus. A pregnant woman should be tested if she may have been exposed to the virus or if she has symptoms of Zika. She may also have additional tests done during her pregnancy, such ultrasound testing. Talk with your health care provider about which tests are recommended. This information is not intended to replace advice given to you by your health care provider. Make sure you discuss any questions you have with your health care provider. Document Released: 12/25/2014 Document Revised: 09/11/2015  Document Reviewed: 12/18/2014 Elsevier Interactive Patient Education  2018 [employer]. Hyperemesis Gravidarum Hyperemesis gravidarum is a severe form of nausea and vomiting that happens during pregnancy. Hyperemesis is worse than morning sickness. It may cause you to have nausea or vomiting all day for many days. It may keep you from eating and drinking enough food and liquids. Hyperemesis usually occurs during the first half (the first 20 weeks) of pregnancy. It often goes away once a woman is in her second half of pregnancy. However, sometimes hyperemesis continues through an entire pregnancy. What are the causes? The cause of this condition is not known. It may be related to changes in chemicals (hormones) in the body during pregnancy, such as the high level of pregnancy hormone (human chorionic gonadotropin) or the increase in the female sex hormone (estrogen). What are the signs or symptoms? Symptoms of this condition include:  Severe nausea and vomiting.  Nausea that  does not go away.  Vomiting that does not allow you to keep any food down.  Weight loss.  Body fluid loss (dehydration).  Having no desire to eat, or not liking food that you have previously enjoyed.  How is this diagnosed? This condition may be diagnosed based on:  A physical exam.  Your medical history.  Your symptoms.  Blood tests.  Urine tests.  How is this treated? This condition may be managed with medicine. If medicines to do not help relieve nausea and vomiting, you may need to receive fluids through an IV tube at the hospital. Follow these instructions at home:  Take over-the-counter and prescription medicines only as told by your health care provider.  Avoid iron pills and multivitamins that contain iron for the first 3-4 months of pregnancy. If you take prescription iron pills, do not stop taking them unless your health care provider approves.  Take the following actions to help prevent nausea and vomiting: ? In the morning, before getting out of bed, try eating a couple of dry crackers or a piece of toast. ? Avoid foods and smells that upset your stomach. Fatty and spicy foods may make nausea worse. ? Eat 5-6 small meals a day. ? Do not drink fluids while eating meals. Drink between meals. ? Eat or suck on things that have ginger in them. Ginger can help relieve nausea. ? Avoid food preparation. The smell of food can spoil your appetite or trigger nausea.  Follow instructions from your health care provider about eating or drinking restrictions.  For snacks, eat high-protein foods, such as cheese.  Keep all follow-up and pre-birth (prenatal) visits as told by your health care provider. This is important. Contact a health care provider if:  You have pain in your abdomen.  You have a severe headache.  You have vision problems.  You are losing weight. Get help right away if:  You cannot drink fluids without vomiting.  You vomit blood.  You have  constant nausea and vomiting.  You are very weak.  You are very thirsty.  You feel dizzy.  You faint.  You have a fever or other symptoms that last for more than 2-3 days.  You have a fever and your symptoms suddenly get worse. Summary  Hyperemesis gravidarum is a severe form of nausea and vomiting that happens during pregnancy.  Making some changes to your eating habits may help relieve nausea and vomiting.  This condition may be managed with medicine.  If medicines to do not help relieve nausea and vomiting, you may  need to receive fluids through an IV tube at the hospital. This information is not intended to replace advice given to you by your health care provider. Make sure you discuss any questions you have with your health care provider. Document Released: 04/05/2005 Document Revised: 12/03/2015 Document Reviewed: 12/03/2015 Elsevier Interactive Patient Education  2017 [employer]. Commonly Asked Questions During Pregnancy  Cats: A parasite can be excreted in cat feces.  To avoid exposure you need to have another person empty the little box.  If you must empty the litter box you will need to wear gloves.  Wash your hands after handling your cat.  This parasite can also be found in raw or undercooked meat so this should also be avoided.  Colds, Sore Throats, Flu: Please check your medication sheet to see what you can take for symptoms.  If your symptoms are unrelieved by these medications please call the office.  Dental Work: Most any dental work Agricultural consultant recommends is permitted.  X-rays should only be taken during the first trimester if absolutely necessary.  Your abdomen should be shielded with a lead apron during all x-rays.  Please notify your provider prior to receiving any x-rays.  Novocaine is fine; gas is not recommended.  If your dentist requires a note from [place] prior to dental work please call the office and we will provide one for you.  Exercise: Exercise is an  important part of staying healthy during your pregnancy.  You may continue most exercises you were accustomed to prior to pregnancy.  Later in your pregnancy you will most likely notice you have difficulty with activities requiring balance like riding a bicycle.  It is important that you listen to your body and avoid activities that put you at a higher risk of falling.  Adequate rest and staying well hydrated are a must!  If you have questions about the safety of specific activities ask your provider.    Exposure to Children with illness: Try to avoid obvious exposure; report any symptoms to [place] when noted,  If you have chicken pos, red measles or mumps, you should be immune to these diseases.   Please do not take any vaccines while pregnant unless you have checked with your OB provider.  Fetal Movement: After 28 weeks we recommend you do "kick counts" twice daily.  Lie or sit down in a calm quiet environment and count your baby movements "kicks".  You should feel your baby at least 10 times per hour.  If you have not felt 10 kicks within the first hour get up, walk around and have something sweet to eat or drink then repeat for an additional hour.  If count remains less than 10 per hour notify your provider.  Fumigating: Follow your pest control agent's advice as to how long to stay out of your home.  Ventilate the area well before re-entering.  Hemorrhoids:   Most over-the-counter preparations can be used during pregnancy.  Check your medication to see what is safe to use.  It is important to use a stool softener or fiber in your diet and to drink lots of liquids.  If hemorrhoids seem to be getting worse please call the office.   Hot Tubs:  Hot tubs Jacuzzis and saunas are not recommended while pregnant.  These increase your internal body temperature and should be avoided.  Intercourse:  Sexual intercourse is safe during pregnancy as long as you are comfortable, unless otherwise advised by your  provider.  Spotting  may occur after intercourse; report any bright red bleeding that is heavier than spotting.  Labor:  If you know that you are in labor, please go to the hospital.  If you are unsure, please call the office and let us help you decide what to do.  Lifting, straining, etc:  If your job requires heavy lifting or straining please check with your provider for any limitations.  Generally, you should not lift items heavier than that you can lift simply with your hands and arms (no back muscles)  Painting:  Paint fumes do not harm your pregnancy, but may make you ill and should be avoided if possible.  Latex or water based paints have less odor than oils.  Use adequate ventilation while painting.  Permanents & Hair Color:  Chemicals in hair dyes are not recommended as they cause increase hair dryness which can increase hair loss during pregnancy.  " Highlighting" and permanents are allowed.  Dye may be absorbed differently and permanents may not hold as well during pregnancy.  Sunbathing:  Use a sunscreen, as skin burns easily during pregnancy.  Drink plenty of fluids; avoid over heating.  Tanning Beds:  Because their possible side effects are still unknown, tanning beds are not recommended.  Ultrasound Scans:  Routine ultrasounds are performed at approximately 20 weeks.  You will be able to see your baby's general anatomy an if you would like to know the gender this can usually be determined as well.  If it is questionable when you conceived you may also receive an ultrasound early in your pregnancy for dating purposes.  Otherwise ultrasound exams are not routinely performed unless there is a medical necessity.  Although you can request a scan we ask that you pay for it when conducted because insurance does not cover " patient request" scans.  Work: If your pregnancy proceeds without complications you may work until your due date, unless your physician or employer advises  otherwise.  Round Ligament Pain/Pelvic Discomfort:  Sharp, shooting pains not associated with bleeding are fairly common, usually occurring in the second trimester of pregnancy.  They tend to be worse when standing up or when you remain standing for long periods of time.  These are the result of pressure of certain pelvic ligaments called "round ligaments".  Rest, Tylenol and heat seem to be the most effective relief.  As the womb and fetus grow, they rise out of the pelvis and the discomfort improves.  Please notify the office if your pain seems different than that described.  It may represent a more serious condition.

## 2017-01-15 LAB — HEMOGLOBIN A1C
Est. average glucose Bld gHb Est-mCnc: 82 mg/dL
Hgb A1c MFr Bld: 4.5 % — ABNORMAL LOW (ref 4.8–5.6)

## 2017-01-15 LAB — CBC WITH DIFFERENTIAL/PLATELET
BASOS: 0 %
Basophils Absolute: 0 10*3/uL (ref 0.0–0.2)
EOS (ABSOLUTE): 0.2 10*3/uL (ref 0.0–0.4)
EOS: 2 %
HEMATOCRIT: 40.9 % (ref 34.0–46.6)
Hemoglobin: 13.9 g/dL (ref 11.1–15.9)
IMMATURE GRANULOCYTES: 0 %
Immature Grans (Abs): 0 10*3/uL (ref 0.0–0.1)
LYMPHS ABS: 1.9 10*3/uL (ref 0.7–3.1)
Lymphs: 21 %
MCH: 30.2 pg (ref 26.6–33.0)
MCHC: 34 g/dL (ref 31.5–35.7)
MCV: 89 fL (ref 79–97)
MONOS ABS: 0.8 10*3/uL (ref 0.1–0.9)
Monocytes: 9 %
NEUTROS ABS: 6.1 10*3/uL (ref 1.4–7.0)
Neutrophils: 68 %
Platelets: 219 10*3/uL (ref 150–379)
RBC: 4.6 x10E6/uL (ref 3.77–5.28)
RDW: 13.9 % (ref 12.3–15.4)
WBC: 8.9 10*3/uL (ref 3.4–10.8)

## 2017-01-15 LAB — RPR: RPR: NONREACTIVE

## 2017-01-15 LAB — TSH: TSH: 1.53 u[IU]/mL (ref 0.450–4.500)

## 2017-01-15 LAB — RH TYPE: Rh Factor: POSITIVE

## 2017-01-15 LAB — HIV ANTIBODY (ROUTINE TESTING W REFLEX): HIV Screen 4th Generation wRfx: NONREACTIVE

## 2017-01-15 LAB — HEPATITIS B SURFACE ANTIGEN: HEP B S AG: NEGATIVE

## 2017-01-15 LAB — ABO

## 2017-01-15 LAB — RUBELLA SCREEN: RUBELLA: 1.53 {index} (ref >0.99)

## 2017-01-15 LAB — VARICELLA ZOSTER ANTIBODY, IGG: Varicella zoster IgG: 453 index (ref >165)

## 2017-01-15 LAB — ANTIBODY SCREEN: Antibody Screen: NEGATIVE

## 2017-01-17 LAB — URINALYSIS, ROUTINE W REFLEX MICROSCOPIC
BILIRUBIN UA: NEGATIVE
Glucose, UA: NEGATIVE
Ketones, UA: NEGATIVE
LEUKOCYTES UA: NEGATIVE
NITRITE UA: NEGATIVE
Protein, UA: NEGATIVE
RBC UA: NEGATIVE
Specific Gravity, UA: 1.023 (ref 1.005–1.030)
UUROB: 0.2 mg/dL (ref 0.2–1.0)
pH, UA: 5.5 (ref 5.0–7.5)

## 2017-01-17 LAB — NICOTINE SCREEN, URINE: COTININE UR QL SCN: NEGATIVE ng/mL

## 2017-01-17 LAB — URINE CULTURE, OB REFLEX

## 2017-01-17 LAB — MONITOR DRUG PROFILE 14(MW)
AMPHETAMINE SCREEN URINE: NEGATIVE ng/mL
BARBITURATE SCREEN URINE: NEGATIVE ng/mL
BENZODIAZEPINE SCREEN, URINE: NEGATIVE ng/mL
Buprenorphine, Urine: NEGATIVE ng/mL
CANNABINOIDS UR QL SCN: NEGATIVE ng/mL
Cocaine (Metab) Scrn, Ur: NEGATIVE ng/mL
Creatinine(Crt), U: 120.5 mg/dL (ref 20.0–300.0)
Fentanyl, Urine: NEGATIVE pg/mL
Meperidine Screen, Urine: NEGATIVE ng/mL
Methadone Screen, Urine: NEGATIVE ng/mL
OXYCODONE+OXYMORPHONE UR QL SCN: NEGATIVE ng/mL
Opiate Scrn, Ur: NEGATIVE ng/mL
PH UR, DRUG SCRN: 5.6 (ref 4.5–8.9)
PROPOXYPHENE SCREEN URINE: NEGATIVE ng/mL
Phencyclidine Qn, Ur: NEGATIVE ng/mL
SPECIFIC GRAVITY: 1.024
Tramadol Screen, Urine: NEGATIVE ng/mL

## 2017-01-17 LAB — CULTURE, OB URINE

## 2017-01-18 LAB — GC/CHLAMYDIA PROBE AMP
CHLAMYDIA, DNA PROBE: POSITIVE — AB
NEISSERIA GONORRHOEAE BY PCR: NEGATIVE

## 2017-01-20 ENCOUNTER — Ambulatory Visit (INDEPENDENT_AMBULATORY_CARE_PROVIDER_SITE_OTHER): Payer: BLUE CROSS/BLUE SHIELD | Admitting: Obstetrics and Gynecology

## 2017-01-20 VITALS — BP 106/69 | HR 78 | Wt 233.4 lb

## 2017-01-20 DIAGNOSIS — Z3401 Encounter for supervision of normal first pregnancy, first trimester: Secondary | ICD-10-CM

## 2017-01-20 LAB — POCT URINALYSIS DIPSTICK
Bilirubin, UA: NEGATIVE
Blood, UA: NEGATIVE
GLUCOSE UA: NEGATIVE
LEUKOCYTES UA: NEGATIVE
Nitrite, UA: NEGATIVE
PROTEIN UA: NEGATIVE
SPEC GRAV UA: 1.015 (ref 1.010–1.025)
UROBILINOGEN UA: 0.2 U/dL
pH, UA: 6.5 (ref 5.0–8.0)

## 2017-01-20 NOTE — Progress Notes (Signed)
NOB:  She presents today for her new OB physical. She has morning nausea and vomiting but eats lunch and dinner every day. Ketones in urine and weight loss noted. We have discussed strategies for this in detail.  We discussed genetic testing as well as quad screen testing. Patient will inform us of her decision at next prenatal visit.  Pap GC/CT performed today.  Physical examination General NAD, Conversant  HEENT Atraumatic; Op clear with mmm.  Normo-cephalic. Pupils reactive. Anicteric sclerae  Thyroid/Neck Smooth without nodularity or enlargement. Normal ROM.  Neck Supple.  Skin No rashes, lesions or ulceration. Normal palpated skin turgor. No nodularity.  Breasts: No masses or discharge.  Symmetric.  No axillary adenopathy.  Lungs: Clear to auscultation.No rales or wheezes. Normal Respiratory effort, no retractions.  Heart: NSR.  No murmurs or rubs appreciated. No periferal edema  Abdomen: Soft.  Non-tender.  No masses.  No HSM. No hernia POS Fhts  Extremities: Moves all appropriately.  Normal ROM for age. No lymphadenopathy.  Neuro: Oriented to PPT.  Normal mood. Normal affect.     Pelvic:   Vulva: Normal appearance.  No lesions.  Vagina: No lesions or abnormalities noted.  Support: Normal pelvic support.  Urethra No masses tenderness or scarring.  Meatus Normal size without lesions or prolapse.  Cervix: Normal appearance.  No lesions.  Anus: Normal exam.  No lesions.  Perineum: Normal exam.  No lesions.        Bimanual   Adnexae: No masses.  Non-tender to palpation.  Uterus: Enlarged. 13 wks  Non-tender.  Mobile.  AV.  Adnexae: No masses.  Non-tender to palpation.  Cul-de-sac: Negative for abnormality.  Adnexae: No masses.  Non-tender to palpation.         Pelvimetry   Diagonal: Reached.  Spines: Average.  Sacrum: Concave.  Pubic Arch: Normal.

## 2017-01-20 NOTE — Addendum Note (Signed)
Addended by: Brooke Dare on: 01/20/2017 04:59 PM   Modules accepted: Orders

## 2017-01-25 ENCOUNTER — Telehealth: Payer: Self-pay

## 2017-01-25 LAB — PAP IG, CT-NG, RFX HPV ASCU
CHLAMYDIA, NUC. ACID AMP: POSITIVE — AB
Gonococcus by Nucleic Acid Amp: NEGATIVE
PAP Smear Comment: 0

## 2017-01-25 NOTE — Telephone Encounter (Signed)
Attempted to contact pt- no answer- message left on pts voicemail to please call office to make an appointment to review test results.

## 2017-01-25 NOTE — Telephone Encounter (Signed)
-----   Message from David James Evans, MD sent at 01/25/2017 12:20 PM EDT ----- Based on these results, please have her schedule a visit with me. 

## 2017-01-27 ENCOUNTER — Telehealth: Payer: Self-pay

## 2017-01-27 NOTE — Telephone Encounter (Signed)
Left another message on pts voicemail to please call office to make another appointment for test results.

## 2017-01-27 NOTE — Telephone Encounter (Signed)
-----   Message from David James Evans, MD sent at 01/25/2017 12:20 PM EDT ----- Based on these results, please have her schedule a visit with me. 

## 2017-01-31 ENCOUNTER — Telehealth: Payer: Self-pay

## 2017-01-31 NOTE — Telephone Encounter (Signed)
-----   Message from Linzie Collin, MD sent at 01/25/2017 12:20 PM EDT ----- Based on these results, please have her schedule a visit with me.

## 2017-01-31 NOTE — Telephone Encounter (Signed)
On 01/28/17 sent an unable to reach letter.

## 2017-01-31 NOTE — Telephone Encounter (Signed)
-----   Message from David James Evans, MD sent at 01/25/2017 12:20 PM EDT ----- Based on these results, please have her schedule a visit with me. 

## 2017-02-03 ENCOUNTER — Telehealth: Payer: Self-pay | Admitting: Obstetrics and Gynecology

## 2017-02-03 DIAGNOSIS — A749 Chlamydial infection, unspecified: Secondary | ICD-10-CM

## 2017-02-03 DIAGNOSIS — O98819 Other maternal infectious and parasitic diseases complicating pregnancy, unspecified trimester: Secondary | ICD-10-CM

## 2017-02-03 HISTORY — DX: Chlamydial infection, unspecified: A74.9

## 2017-02-03 HISTORY — DX: Other maternal infectious and parasitic diseases complicating pregnancy, unspecified trimester: O98.819

## 2017-02-03 NOTE — Telephone Encounter (Signed)
Pts mom aware of pos chlamydia. Appt for 10/24.

## 2017-02-03 NOTE — Telephone Encounter (Signed)
Please call mom concerning results of test- patient recv'd letter in mail to call to schedule an appointment.

## 2017-02-09 ENCOUNTER — Ambulatory Visit (INDEPENDENT_AMBULATORY_CARE_PROVIDER_SITE_OTHER): Payer: BLUE CROSS/BLUE SHIELD | Admitting: Obstetrics and Gynecology

## 2017-02-09 VITALS — BP 100/56 | HR 82 | Wt 237.4 lb

## 2017-02-09 DIAGNOSIS — A749 Chlamydial infection, unspecified: Secondary | ICD-10-CM

## 2017-02-09 LAB — POCT URINALYSIS DIPSTICK
Bilirubin, UA: NEGATIVE
Blood, UA: NEGATIVE
GLUCOSE UA: NEGATIVE
Ketones, UA: NEGATIVE
Leukocytes, UA: NEGATIVE
NITRITE UA: NEGATIVE
PH UA: 7.5 (ref 5.0–8.0)
Protein, UA: NEGATIVE
Spec Grav, UA: 1.01 (ref 1.010–1.025)
UROBILINOGEN UA: 0.2 U/dL

## 2017-02-09 MED ORDER — AZITHROMYCIN 500 MG PO TABS: 1000.0000 mg | ORAL_TABLET | Freq: Once | ORAL | 0 refills | Status: AC

## 2017-02-09 NOTE — Progress Notes (Signed)
Problem visit: Patient has positive chlamydia. -Zithromax.  We discussed genetic testing again and patient declined.  She wants quad screen with next prenatal visit.  Vomiting resolved-weight gain improved.

## 2017-02-09 NOTE — Progress Notes (Signed)
HPI:      [name] is a 21 y.o. G1P0 who LMP was Patient's last menstrual period was 10/17/2016 (approximate).  Subjective:   She presents today because she was found to have a positive chlamydia.  She reports that she was asymptomatic.    Hx: The following portions of the patient's history were reviewed and updated as appropriate:             She  has a past medical history of Missed menses. She  does not have a problem list on file. She  has a past surgical history that includes Tonsillectomy. Her family history includes Cirrhosis in her mother; Diabetes in her mother; Thyroid disease in her mother. She  reports that she has never smoked. She has never used smokeless tobacco. She reports that she does not drink alcohol or use drugs. She is allergic to amoxicillin.       Review of Systems:  Review of Systems  Constitutional: Denied constitutional symptoms, night sweats, recent illness, fatigue, fever, insomnia and weight loss.  Eyes: Denied eye symptoms, eye pain, photophobia, vision change and visual disturbance.  Ears/Nose/Throat/Neck: Denied ear, nose, throat or neck symptoms, hearing loss, nasal discharge, sinus congestion and sore throat.  Cardiovascular: Denied cardiovascular symptoms, arrhythmia, chest pain/pressure, edema, exercise intolerance, orthopnea and palpitations.  Respiratory: Denied pulmonary symptoms, asthma, pleuritic pain, productive sputum, cough, dyspnea and wheezing.  Gastrointestinal: Denied, gastro-esophageal reflux, melena, nausea and vomiting.  Genitourinary: Denied genitourinary symptoms including symptomatic vaginal discharge, pelvic relaxation issues, and urinary complaints.  Musculoskeletal: Denied musculoskeletal symptoms, stiffness, swelling, muscle weakness and myalgia.  Dermatologic: Denied dermatology symptoms, rash and scar.  Neurologic: Denied neurology symptoms, dizziness, headache, neck pain and syncope.  Psychiatric: Denied  psychiatric symptoms, anxiety and depression.  Endocrine: Denied endocrine symptoms including hot flashes and night sweats.   Meds:   Current Outpatient Prescriptions on File Prior to Visit  Medication Sig Dispense Refill  . Prenatal Vit-Fe Fumarate-FA (PRENATAL MULTIVITAMIN) TABS tablet Take 1 tablet by mouth daily at 12 noon.     No current facility-administered medications on file prior to visit.     Objective:     Vitals:   02/09/17 0952  BP: (!) 100/56  Pulse: 82                Assessment:    G1P0 There are no active problems to display for this patient.    1. Chlamydia infection        Plan:            1.  We will treat with Zithromax.  Discussed treatment of sexual partners in detail.  Discussed test of cure.  2.   Materniti 21 and AFP and quad screen discussed in detail.  Patient has declined maternity 21 and would like the quad screen at her next visit. Orders Orders Placed This Encounter  Procedures  . POCT urinalysis dipstick    No orders of the defined types were placed in this encounter.     F/U  No Follow-up on file. I spent 15 minutes with this patient of which greater than 50% was spent discussing chlamydia, treatment and follow-up, sexual partners and necessary treatment, resumption of intercourse.  We have also discussed genetic testing and testing for spina bifida in detail.  Elonda Huskyavid J. Ariya Bohannon, M.D. 02/09/2017 10:15 AM

## 2017-02-18 ENCOUNTER — Encounter: Payer: Self-pay | Admitting: Obstetrics and Gynecology

## 2017-02-18 ENCOUNTER — Encounter: Payer: BLUE CROSS/BLUE SHIELD | Admitting: Obstetrics and Gynecology

## 2017-02-18 ENCOUNTER — Ambulatory Visit (INDEPENDENT_AMBULATORY_CARE_PROVIDER_SITE_OTHER): Payer: BLUE CROSS/BLUE SHIELD | Admitting: Obstetrics and Gynecology

## 2017-02-18 VITALS — BP 96/64 | HR 80 | Wt 238.3 lb

## 2017-02-18 DIAGNOSIS — Z1379 Encounter for other screening for genetic and chromosomal anomalies: Secondary | ICD-10-CM

## 2017-02-18 DIAGNOSIS — A749 Chlamydial infection, unspecified: Secondary | ICD-10-CM

## 2017-02-18 DIAGNOSIS — O98812 Other maternal infectious and parasitic diseases complicating pregnancy, second trimester: Secondary | ICD-10-CM

## 2017-02-18 DIAGNOSIS — Z3402 Encounter for supervision of normal first pregnancy, second trimester: Secondary | ICD-10-CM

## 2017-02-18 DIAGNOSIS — O98312 Other infections with a predominantly sexual mode of transmission complicating pregnancy, second trimester: Secondary | ICD-10-CM

## 2017-02-18 DIAGNOSIS — A568 Sexually transmitted chlamydial infection of other sites: Secondary | ICD-10-CM

## 2017-02-18 LAB — POCT URINALYSIS DIPSTICK
BILIRUBIN UA: NEGATIVE
Blood, UA: NEGATIVE
GLUCOSE UA: NEGATIVE
Ketones, UA: NEGATIVE
LEUKOCYTES UA: NEGATIVE
NITRITE UA: NEGATIVE
Protein, UA: NEGATIVE
Spec Grav, UA: 1.025 (ref 1.010–1.025)
Urobilinogen, UA: 0.2 E.U./dL
pH, UA: 7 (ref 5.0–8.0)

## 2017-02-18 NOTE — Progress Notes (Signed)
ROB- Pt has not complaints, she states she is feeling well, declines-flu vaccine

## 2017-02-18 NOTE — Progress Notes (Signed)
ROB: Patient without complaints. Declines flu vaccine.  Will need TOC for chlamydia next visit.  For quad screen today. RTC in 4 weeks. For anatomy scan by next visit.

## 2017-02-22 LAB — AFP TETRA
DIA MOM VALUE: 1.35
DIA VALUE (EIA): 176.9 pg/mL
DSR (By Age)    1 IN: 1124
DSR (Second Trimester) 1 IN: 10000
Gestational Age: 16.4 WEEKS
MATERNAL AGE AT EDD: 22.2 a
MSAFP Mom: 1.47
MSAFP: 39.5 ng/mL
MSHCG MOM: 2.43
MSHCG: 65455 m[IU]/mL
Osb Risk: 2965
TEST RESULTS AFP: NEGATIVE
UE3 MOM: 1.38
Weight: 230 [lb_av]
uE3 Value: 1.05 ng/mL

## 2017-03-17 ENCOUNTER — Ambulatory Visit (INDEPENDENT_AMBULATORY_CARE_PROVIDER_SITE_OTHER): Payer: BLUE CROSS/BLUE SHIELD | Admitting: Obstetrics and Gynecology

## 2017-03-17 ENCOUNTER — Encounter: Payer: Self-pay | Admitting: Obstetrics and Gynecology

## 2017-03-17 ENCOUNTER — Ambulatory Visit (INDEPENDENT_AMBULATORY_CARE_PROVIDER_SITE_OTHER): Payer: BLUE CROSS/BLUE SHIELD

## 2017-03-17 VITALS — BP 102/69 | HR 77 | Wt 240.1 lb

## 2017-03-17 DIAGNOSIS — A568 Sexually transmitted chlamydial infection of other sites: Secondary | ICD-10-CM

## 2017-03-17 DIAGNOSIS — Z3402 Encounter for supervision of normal first pregnancy, second trimester: Secondary | ICD-10-CM

## 2017-03-17 DIAGNOSIS — A749 Chlamydial infection, unspecified: Secondary | ICD-10-CM

## 2017-03-17 DIAGNOSIS — O98312 Other infections with a predominantly sexual mode of transmission complicating pregnancy, second trimester: Principal | ICD-10-CM

## 2017-03-17 DIAGNOSIS — O98812 Other maternal infectious and parasitic diseases complicating pregnancy, second trimester: Secondary | ICD-10-CM | POA: Diagnosis not present

## 2017-03-17 LAB — POCT URINALYSIS DIPSTICK
Bilirubin, UA: NEGATIVE
GLUCOSE UA: NEGATIVE
Ketones, UA: NEGATIVE
Leukocytes, UA: NEGATIVE
Nitrite, UA: NEGATIVE
PH UA: 6 (ref 5.0–8.0)
Protein, UA: NEGATIVE
RBC UA: NEGATIVE
SPEC GRAV UA: 1.02 (ref 1.010–1.025)
UROBILINOGEN UA: 0.2 U/dL

## 2017-03-17 NOTE — Progress Notes (Signed)
ROB: Patient without complaint.  Anatomic survey today completed.  Weight gain discussed.  Relationship to BMI and anesthesia reviewed.  Patient feeling fetal movement.  TOC CT done.

## 2017-03-18 LAB — GC/CHLAMYDIA PROBE AMP
CHLAMYDIA, DNA PROBE: NEGATIVE
NEISSERIA GONORRHOEAE BY PCR: NEGATIVE

## 2017-03-21 ENCOUNTER — Telehealth: Payer: Self-pay | Admitting: Obstetrics and Gynecology

## 2017-03-21 NOTE — Telephone Encounter (Signed)
The patient's mother called and stated that the patient is experiencing sever cramping and would like to speak with a nurse as soon as possible. No other information was disclosed. Please advise.

## 2017-03-22 NOTE — Telephone Encounter (Signed)
Pts mother called office- Velna HatchetSheila- She states the pt has had right sided cramps. Pt is at work. Working 12 hour days. No VB or VD. Pos fm. No uti sx. Pos eating an drinking. Pt has not had any h20 today per Velna HatchetSheila. Advised tylenol es. 64oz plus water a day. May want to try a belly band. If any changes to contact office.

## 2017-03-22 NOTE — Telephone Encounter (Signed)
lmtrc

## 2017-04-19 NOTE — L&D Delivery Note (Signed)
Delivery Summary for Casey Fuentes  Labor Events:   Preterm labor:   Rupture date: 08/02/2017  Rupture time: 2:51 PM  Rupture type: Artificial  Fluid Color:   Induction:   Augmentation:   Complications:   Cervical ripening:          Delivery:   Episiotomy:   Lacerations:   Repair suture:   Repair # of packets:   Blood loss (ml): 600   Information for the patient's newborn:  Casey Fuentes, Casey Fuentes [[id number]][[id number]]    Delivery 08/02/2017 7:40 PM by  C-Section, Low Transverse Sex:  female Gestational Age: 2475w1d Delivery Clinician:   Living?:         APGARS  One minute Five minutes Ten minutes  Skin color:        Heart rate:        Grimace:        Muscle tone:        Breathing:        Totals: 3  7  9     Presentation/position:      Resuscitation:   Cord information:    Disposition of cord blood:     Blood gases sent?  Complications:   Placenta: Delivered:       appearance Newborn Measurements: Weight: 7 lb 10.8 oz (3480 g)  Height: 21.26"  Head circumference:    Chest circumference:    Other providers:    Additional  information: Forceps:   Vacuum:   Breech:   Observed anomalies       See Dr. Oretha Milchherry's operative note for details of procedure.    Casey Fuentes, Casey Loewe, MD [facility]

## 2017-04-20 ENCOUNTER — Encounter: Payer: Self-pay | Admitting: Obstetrics and Gynecology

## 2017-04-20 ENCOUNTER — Ambulatory Visit (INDEPENDENT_AMBULATORY_CARE_PROVIDER_SITE_OTHER): Payer: BLUE CROSS/BLUE SHIELD | Admitting: Obstetrics and Gynecology

## 2017-04-20 VITALS — BP 118/69 | HR 80 | Wt 250.2 lb

## 2017-04-20 DIAGNOSIS — Z3402 Encounter for supervision of normal first pregnancy, second trimester: Secondary | ICD-10-CM

## 2017-04-20 LAB — POCT URINALYSIS DIPSTICK
Bilirubin, UA: NEGATIVE
Blood, UA: NEGATIVE
Glucose, UA: NEGATIVE
KETONES UA: NEGATIVE
Leukocytes, UA: NEGATIVE
NITRITE UA: NEGATIVE
PROTEIN UA: NEGATIVE
SPEC GRAV UA: 1.01 (ref 1.010–1.025)
Urobilinogen, UA: 0.2 E.U./dL
pH, UA: 7.5 (ref 5.0–8.0)

## 2017-04-20 NOTE — Progress Notes (Signed)
ROB:  Pt reducing work to 8 hours per day.

## 2017-04-26 ENCOUNTER — Encounter: Payer: Self-pay | Admitting: Obstetrics and Gynecology

## 2017-05-05 ENCOUNTER — Telehealth: Payer: Self-pay

## 2017-05-05 ENCOUNTER — Observation Stay
Admission: EM | Admit: 2017-05-05 | Discharge: 2017-05-05 | Disposition: A | Discharge status: Home / Self Care | Payer: BLUE CROSS/BLUE SHIELD | Attending: Obstetrics and Gynecology | Admitting: Obstetrics and Gynecology

## 2017-05-05 ENCOUNTER — Telehealth: Payer: Self-pay | Admitting: Obstetrics and Gynecology

## 2017-05-05 DIAGNOSIS — O368120 Decreased fetal movements, second trimester, not applicable or unspecified: Secondary | ICD-10-CM | POA: Diagnosis not present

## 2017-05-05 DIAGNOSIS — O26893 Other specified pregnancy related conditions, third trimester: Secondary | ICD-10-CM

## 2017-05-05 DIAGNOSIS — O0993 Supervision of high risk pregnancy, unspecified, third trimester: Secondary | ICD-10-CM

## 2017-05-05 DIAGNOSIS — Z3A27 27 weeks gestation of pregnancy: Secondary | ICD-10-CM | POA: Diagnosis not present

## 2017-05-05 NOTE — OB Triage Note (Signed)
Patient came in for observation for decreased fetal movement since last night around 2100. Patient denies uterine contractions at this time. Patient denies leaking of fluid, denies vaginal bleeding and spotting. Vital signs stable and patient afebrile. Mother at bedside. Will continue to monitor.

## 2017-05-05 NOTE — Telephone Encounter (Signed)
The patient's mother called and stated that the patient has not felt the baby move for a complete day. The patient would like to speak with a nurse as soon as possible. Please advise.

## 2017-05-05 NOTE — Telephone Encounter (Signed)
Spoke with pt- she states she is "not feeling him move as much". Reports she is eating and drinking. Protocal reviewed with pt and instructed if no improvement after 45 min to 1 hr to call triage nurse or L&D. Pt repeated instructions perfectly.

## 2017-05-06 NOTE — Discharge Summary (Signed)
    L&D OB Triage Note  SUBJECTIVE Casey PetrinKaitlyn M Fuentes is a 22 y.o. G1P0 female at 6348w4d, EDD Estimated Date of Delivery: 08/01/17 who presented to triage with complaints of decreased fetal movement.   Obstetric History   G1   P0   T0   P0   A0   L0    SAB0   TAB0   Ectopic0   Multiple0   Live Births0     # Outcome Date GA Lbr Len/2nd Weight Sex Delivery Anes PTL Lv  1 Current               No medications prior to admission.     OBJECTIVE  Nursing Evaluation:   BP 117/64 (BP Location: Right Arm)   Pulse 78   Temp 98.3 F (36.8 C) (Oral)   Resp 19   Ht 5\' 1"  (1.549 m)   Wt 250 lb (113.4 kg)   LMP 10/17/2016 (Approximate)   BMI 47.24 kg/m    Findings:  Active movement  NST was performed and has been reviewed by me.  NST INTERPRETATION: Category I  Mode: External Baseline Rate (A): 135 bpm Variability: Moderate Accelerations: 15 x 15, 10 x 10 Decelerations: None     Contraction Frequency (min): none  ASSESSMENT Impression:  1.  Pregnancy:  G1P0 at 8848w4d , EDD Estimated Date of Delivery: 08/01/17 2.  NST:  reactive  PLAN 1. Reassurance given 2. Discharge home with standard labor precautions given to return to L&D or call the office for problems. 3. Continue routine prenatal care.

## 2017-05-11 ENCOUNTER — Encounter: Payer: BLUE CROSS/BLUE SHIELD | Admitting: Obstetrics and Gynecology

## 2017-05-11 ENCOUNTER — Other Ambulatory Visit: Payer: BLUE CROSS/BLUE SHIELD

## 2017-05-18 ENCOUNTER — Other Ambulatory Visit: Payer: Self-pay

## 2017-05-18 ENCOUNTER — Ambulatory Visit (INDEPENDENT_AMBULATORY_CARE_PROVIDER_SITE_OTHER): Payer: BLUE CROSS/BLUE SHIELD | Admitting: Obstetrics and Gynecology

## 2017-05-18 VITALS — BP 102/66 | HR 76 | Wt 253.8 lb

## 2017-05-18 DIAGNOSIS — Z131 Encounter for screening for diabetes mellitus: Secondary | ICD-10-CM | POA: Diagnosis not present

## 2017-05-18 DIAGNOSIS — Z130 Encounter for screening for diseases of the blood and blood-forming organs and certain disorders involving the immune mechanism: Secondary | ICD-10-CM | POA: Diagnosis not present

## 2017-05-18 DIAGNOSIS — Z3493 Encounter for supervision of normal pregnancy, unspecified, third trimester: Secondary | ICD-10-CM

## 2017-05-18 DIAGNOSIS — O2600 Excessive weight gain in pregnancy, unspecified trimester: Secondary | ICD-10-CM

## 2017-05-18 LAB — POCT URINALYSIS DIPSTICK
BILIRUBIN UA: NEGATIVE
Glucose, UA: NEGATIVE
KETONES UA: NEGATIVE
Leukocytes, UA: NEGATIVE
Nitrite, UA: NEGATIVE
RBC UA: NEGATIVE
Spec Grav, UA: 1.01 (ref 1.010–1.025)
UROBILINOGEN UA: 0.2 U/dL
pH, UA: 7 (ref 5.0–8.0)

## 2017-05-18 NOTE — Progress Notes (Signed)
ROB: Doing well, denies complaints. Declines Tdap (reports fear of needles). Further discussion had on BMI and delivery.  Discussed that BMI over 45 does require an anesthesia consultation, will refer after 32 weeks. Also cautioned on avoiding any further weight gain.  For 28 week labs today.  Desires to breastfeed,  desires unsure method for contraception, given info to review. Signed blood consent, discussed cord blood banking. Recommended dietary changes, exercise, can refer to Nutritionist. RTC in 2 weeks.

## 2017-05-18 NOTE — Progress Notes (Signed)
ROB- glucola done.blood consent, declined tdap given, pt is doing well

## 2017-05-19 LAB — CBC
Hematocrit: 36.5 % (ref 34.0–46.6)
Hemoglobin: 12.3 g/dL (ref 11.1–15.9)
MCH: 30.7 pg (ref 26.6–33.0)
MCHC: 33.7 g/dL (ref 31.5–35.7)
MCV: 91 fL (ref 79–97)
PLATELETS: 233 10*3/uL (ref 150–379)
RBC: 4.01 x10E6/uL (ref 3.77–5.28)
RDW: 13.9 % (ref 12.3–15.4)
WBC: 9.5 10*3/uL (ref 3.4–10.8)

## 2017-05-19 LAB — GLUCOSE, 1 HOUR GESTATIONAL: GESTATIONAL DIABETES SCREEN: 98 mg/dL (ref 65–139)

## 2017-05-19 LAB — RPR: RPR: NONREACTIVE

## 2017-05-26 ENCOUNTER — Telehealth: Payer: Self-pay | Admitting: Obstetrics and Gynecology

## 2017-05-26 NOTE — Telephone Encounter (Signed)
The patient called and stated that she is feeling quite a bit of pressure that is making her uncomfortable. The patient stated that when she walks it feels like the baby could possibly come out. No other information was disclosed. Please advise.

## 2017-05-26 NOTE — Telephone Encounter (Signed)
OB 30 weeks c/o of back pressure and leg pain x 2 days. When she walks she feels more pressure. NO uti sx, lof, or vb. No fever. BM- wnl. Pos fm. NO n/v/d. Pos for eating and drinking. Only 1 bottle of water today. NO tylenol tried. Encouraged pt to push fluids- 64 oz a day. Tylenol as needed. Belly band for support. Lie on left side with pillow between her legs. If develops vb, ctx or lof to contact office.

## 2017-06-01 ENCOUNTER — Ambulatory Visit (INDEPENDENT_AMBULATORY_CARE_PROVIDER_SITE_OTHER): Payer: BLUE CROSS/BLUE SHIELD | Admitting: Obstetrics and Gynecology

## 2017-06-01 ENCOUNTER — Encounter: Payer: Self-pay | Admitting: Obstetrics and Gynecology

## 2017-06-01 VITALS — BP 96/60 | HR 83 | Wt 258.0 lb

## 2017-06-01 DIAGNOSIS — O0993 Supervision of high risk pregnancy, unspecified, third trimester: Secondary | ICD-10-CM

## 2017-06-01 DIAGNOSIS — O2603 Excessive weight gain in pregnancy, third trimester: Secondary | ICD-10-CM

## 2017-06-01 HISTORY — DX: Morbid (severe) obesity due to excess calories: E66.01

## 2017-06-01 LAB — POCT URINALYSIS DIPSTICK
BILIRUBIN UA: NEGATIVE
Blood, UA: NEGATIVE
GLUCOSE UA: NEGATIVE
Ketones, UA: NEGATIVE
Leukocytes, UA: NEGATIVE
Nitrite, UA: NEGATIVE
Protein, UA: NEGATIVE
Spec Grav, UA: 1.015 (ref 1.010–1.025)
Urobilinogen, UA: 0.2 E.U./dL
pH, UA: 6 (ref 5.0–8.0)

## 2017-06-01 NOTE — Progress Notes (Signed)
Pt is doing well. Back pain and feet swelling.

## 2017-06-01 NOTE — Progress Notes (Signed)
ROB: Patient overall doing well, no complaints. Discussed weight gain in pregnancy again, patient notes she is trying to monitor portions, but still having difficulty with choosing healthier options. Will refer to Nutrition. Also needs referral to Anesthesiology. Will place.  For growth scan next week due to morbid obesity.  RTC in 2 weeks for visit.

## 2017-06-06 ENCOUNTER — Encounter: Payer: Self-pay | Admitting: Dietician

## 2017-06-06 ENCOUNTER — Other Ambulatory Visit: Payer: Self-pay | Admitting: Obstetrics and Gynecology

## 2017-06-06 ENCOUNTER — Encounter: Payer: BLUE CROSS/BLUE SHIELD | Attending: Obstetrics and Gynecology | Admitting: Dietician

## 2017-06-06 VITALS — Ht 61.0 in | Wt 255.2 lb

## 2017-06-06 DIAGNOSIS — O2600 Excessive weight gain in pregnancy, unspecified trimester: Secondary | ICD-10-CM | POA: Diagnosis not present

## 2017-06-06 DIAGNOSIS — O99213 Obesity complicating pregnancy, third trimester: Principal | ICD-10-CM

## 2017-06-06 NOTE — Patient Instructions (Addendum)
   Control portions of starches to fist-size (1 cup) or less with each meal.   Add some light exercise such as a walk on lighter work weeks.   Keep sugar in drinks and foods low; ideally under 10grams per serving.   Use menus for balanced meal options.

## 2017-06-06 NOTE — Progress Notes (Signed)
Medical Nutrition Therapy: Visit start time: 1100  end time: 1200  Assessment:  Diagnosis: excess weight gain during pregnancy Past medical history: GERD symptoms Psychosocial issues/ stress concerns: none Preferred learning method:  . Auditory   Current weight: 255.2lbs  Height: 5'1" Medications, supplements: prenatal vitamins  Progress and evaluation: Patient reports gain of about 60lbs thus far during pregnancy. She states she had been eating larger food portions since the start of her pregnancy, especially starchy foods like potatoes, pasta, as well as sodas. She works 12-hour shifts, 7a-7p 5 days one week, then 2 days the next week. She is typically very hungry when leaving work, so stops at [employer] restaurants to pick up her dinner. She then goes to sleep soon after.   Physical activity: no structured exercise; walking on the job at work.  Dietary Intake:  Usual eating pattern includes 2-3 meals and 1 snacks per day. Dining out frequency: 5+ meals per week.  Breakfast: pop tarts, muffins, cereal, coffee Snack: none Lunch: leftovers or canned pasta Snack: sandwich from vending machine pimento cheese Supper: often out-- panera bread, mcdonalds; Cong[place]inese Snack: none Beverages: water, sometimes vitamin water, 1 regular soda daily  Nutrition Care Education: Topics covered: weight control, pregnancy nutrition Basic nutrition: basic food groups, appropriate nutrient balance, appropriate meal and snack schedule, general nutrition guidelines    Weight control: Estimated caloric needs at 1500-1800kcal daily and provided guidance for appropriate carbohydrate, protein and fat intake. Discussed some fast food options and advised limiting calories to 500-600kcal for one meal. Discussed importance of limiting added sugars and fats in foods and beverages; advised keeping to no more than 1 sugar-sweetened beverage daily. Instructed on role of physical activity in weight control and advised adding  some exercise on days off during shorter work weeks. Encouraged measuring food portions and reducing portions of starches while increasing portions of low-carb veggies.  Advanced nutrition: dining out   Nutritional Diagnosis:  [place]-3.3 Overweight/obesity As related to excessive weight gain during pregnancy due to excess calories and inactivity.  As evidenced by 60lb weight gain with current BMI of 48.22.  Intervention: Instruction as noted above.   Set goals with input from patient.    She has made some positive changes to decrease calories, and weight gain has stopped.   Education Materials given:  . Food lists/ Planning A Balanced Meal . Sample menus . Goals/ instructions  Learner/ who was taught:  . Patient  . Family member: mother Terrial RhodesSheila Sizemore   Level of understanding: Marland Kitchen. Verbalizes/ demonstrates competency  Demonstrated degree of understanding via:   Teach back Learning barriers: . None  Willingness to learn/ readiness for change: . Eager, change in progress  Monitoring and Evaluation:  Dietary intake, exercise, and body weight      follow up: 06/23/17

## 2017-06-09 ENCOUNTER — Other Ambulatory Visit: Payer: Self-pay

## 2017-06-09 ENCOUNTER — Telehealth: Payer: Self-pay

## 2017-06-09 ENCOUNTER — Observation Stay
Admission: EM | Admit: 2017-06-09 | Discharge: 2017-06-09 | Disposition: A | Discharge status: Home / Self Care | Payer: BLUE CROSS/BLUE SHIELD | Attending: Obstetrics and Gynecology | Admitting: Obstetrics and Gynecology

## 2017-06-09 DIAGNOSIS — O26893 Other specified pregnancy related conditions, third trimester: Secondary | ICD-10-CM | POA: Diagnosis not present

## 2017-06-09 DIAGNOSIS — O26853 Spotting complicating pregnancy, third trimester: Principal | ICD-10-CM | POA: Insufficient documentation

## 2017-06-09 DIAGNOSIS — Z3A32 32 weeks gestation of pregnancy: Secondary | ICD-10-CM | POA: Diagnosis not present

## 2017-06-09 DIAGNOSIS — N939 Abnormal uterine and vaginal bleeding, unspecified: Secondary | ICD-10-CM | POA: Diagnosis present

## 2017-06-09 NOTE — Discharge Summary (Signed)
L&D OB Triage Note  Casey Fuentes M Fuentes is a 22 y.o. G1P0 female at 3368w3d, EDD Estimated Date of Delivery: 08/01/17 who presented to triage for complaints of vaginal spotting x 1 day with passage of a few small clots.  She noted that she was at work yesterday and hit her stomach with a cart. Today she woke up with spotting, and it occurs mostly with wiping. She also c/o feeling a pinch in her vaginal area. Denies abdominal pain, contractions, LOF and notes active fetal movement. She was evaluated by the nurses with no significant findings for any further vaginal bleeding while in triage. Vital signs stable. An NST was performed and has been reviewed by MD.    NST INTERPRETATION: Indications: vaginal bleeding  Mode: External Baseline Rate (A): 125 bpm Variability: Moderate Accelerations: 10 x 10 Decelerations: None     Contraction Frequency (min): None  Impression: reactive   Plan: NST performed was reviewed and was found to be reactive. She was discharged home with bleeding/labor precautions.  Advised on pelvic rest for the next several days. Continue routine prenatal care. Follow up with OB/GYN as previously scheduled.     Hildred LaserAnika Shanay Woolman, MD  [facility]

## 2017-06-09 NOTE — OB Triage Note (Signed)
Pt states at work yesterday a cart hit her in the stomach and she woke up this am with pink tinge spotting in her underwear. It is mainly there when she wipes after using the restroom. She feels a pinch in her vaginal area. The bleeding isnt heavy enough to wear a pad.

## 2017-06-09 NOTE — Discharge Instructions (Signed)
Refrain from any strenuous activity the next couple of days.  If bleeding increases or becomes bright red, please go to the nearest Emergency Department.

## 2017-06-10 ENCOUNTER — Ambulatory Visit (INDEPENDENT_AMBULATORY_CARE_PROVIDER_SITE_OTHER): Payer: BLUE CROSS/BLUE SHIELD

## 2017-06-10 DIAGNOSIS — O99213 Obesity complicating pregnancy, third trimester: Secondary | ICD-10-CM | POA: Diagnosis not present

## 2017-06-14 ENCOUNTER — Telehealth: Payer: Self-pay | Admitting: Obstetrics and Gynecology

## 2017-06-14 NOTE — Telephone Encounter (Signed)
Left message informing pt that her work restr was faxed to her job.

## 2017-06-14 NOTE — Telephone Encounter (Signed)
The patient's mother called and stated that her daughter lost her note for work, and needs another copy faxed to her at the following fax number : [phone].

## 2017-06-15 ENCOUNTER — Encounter
Admission: RE | Admit: 2017-06-15 | Discharge: 2017-06-15 | Disposition: A | Discharge status: Home / Self Care | Payer: BLUE CROSS/BLUE SHIELD | Source: Ambulatory Visit | Attending: Anesthesiology | Admitting: Anesthesiology

## 2017-06-15 ENCOUNTER — Ambulatory Visit (INDEPENDENT_AMBULATORY_CARE_PROVIDER_SITE_OTHER): Payer: BLUE CROSS/BLUE SHIELD | Admitting: Obstetrics and Gynecology

## 2017-06-15 VITALS — BP 106/73 | HR 111 | Wt 257.3 lb

## 2017-06-15 DIAGNOSIS — Z3493 Encounter for supervision of normal pregnancy, unspecified, third trimester: Secondary | ICD-10-CM

## 2017-06-15 DIAGNOSIS — O2603 Excessive weight gain in pregnancy, third trimester: Secondary | ICD-10-CM

## 2017-06-15 LAB — POCT URINALYSIS DIPSTICK
Bilirubin, UA: NEGATIVE
Blood, UA: NEGATIVE
Glucose, UA: NEGATIVE
KETONES UA: NEGATIVE
Leukocytes, UA: NEGATIVE
NITRITE UA: NEGATIVE
PH UA: 7 (ref 5.0–8.0)
SPEC GRAV UA: 1.015 (ref 1.010–1.025)
UROBILINOGEN UA: 0.2 U/dL

## 2017-06-15 NOTE — Progress Notes (Signed)
ROB: No complaints today.  No weight gain since last visit!!!!!  Patient has anesthesia consult today.

## 2017-06-15 NOTE — Telephone Encounter (Signed)
Pt was informed that her work restriction note had be faxed to the number she asked it to be faxed to.

## 2017-06-15 NOTE — Progress Notes (Signed)
ROB- pt is doing well 

## 2017-06-20 NOTE — Anesthesia Pain Management Evaluation Note (Signed)
  Anesthesia Pain Consult Note  Patient: Edsel PetrinKaitlyn M Luallen, 22 y.o., female  Consult Requested by: No att. providers found  Reason for Consult: Obesity  Level of Consciousness: alert  Pain: none   Last Vitals: There were no vitals filed for this visit.  Plan: Epidural infusion for pain control. Consent:Risks of procedure as well as the alternatives and risks of each were explained to the (patient/caregiver).  Consent for procedure obtained.  Allergies  Allergen Reactions  . Amoxicillin     Physical exam: PULM normal  CARDIO Heart sounds are normal.  Regular rate and rhythm without murmur, gallop or rub.  OTHER    I have reviewed the patient's medications listed below.  I have spoken with the patient regarding her risk profile and current BMI.  She has no pulm or cardiac risk factors but is morbidly obese.  Airway eval shows grade 3 with adequate hmd.  [facility] but I have encouraged her to avoid further weight gain and about the policy for potential transfer to a high risk center in the event her weight exceeds the 50 BMI threshold.  JA    Past Medical History:  Diagnosis Date  . Missed menses    Past Surgical History:  Procedure Laterality Date  . TONSILLECTOMY    Yevette EdwardsJames G Amylee Lodato 06/20/2017

## 2017-06-23 ENCOUNTER — Encounter: Payer: Self-pay | Admitting: Dietician

## 2017-06-23 ENCOUNTER — Encounter: Payer: BLUE CROSS/BLUE SHIELD | Attending: Obstetrics and Gynecology | Admitting: Dietician

## 2017-06-23 VITALS — Ht 61.0 in | Wt 258.2 lb

## 2017-06-23 DIAGNOSIS — O2600 Excessive weight gain in pregnancy, unspecified trimester: Secondary | ICD-10-CM

## 2017-06-23 NOTE — Progress Notes (Signed)
Medical Nutrition Therapy: Visit start time: 1110  end time: 1145  Assessment:  Diagnosis: excessive weight gain during pregnancy Medical history changes: no changes Psychosocial issues/ stress concerns: none  Current weight: 258.2lbs  Height: 5'1" Medications, supplement changes: no changes  Progress and evaluation: Weight has increased 3lbs since previous visit on 06/06/17. Patient reports some progress with portions of starchy foods; some eating due to boredom as she has stopped working. Has decreased intake of sodas and some sweets, is drinking sweet tea daily.   Physical activity: none yet.   Dietary Intake:  Usual eating pattern includes 2 meals and 0-1 snacks per day. Dining out frequency: 5 meals per week.  Breakfast: none recently Snack: none (sleeps until lunchtime) Lunch: 12-12:30pm--grilled chicken stir-fried veg and rice; canned pasta, leftovers Snack: usually none; drinks sweet tea Supper: 8-9pm usually out-- recently [place]-- rice and beans, salad, japanese-- noodles chicken, veg.  Snack: none Goes to sleep about 12am Beverages: water, sweet tea, occasional soda  Nutrition Care Education: Topics covered: weight control during pregnancy Basic nutrition: pregnancy nutrition including need for adequate protein, vegetables   Weight control: reviewed strategies for decreasing sugar intake; discussed options for improving nutrient balance in meals; reviewed role of physical activity in weight control and health during pregancy   Nutritional Diagnosis:  [place]-3.3 Overweight/obesity As related to excessive weight gain during pregnancy due to excess caloric intake .  As evidenced by 60lb weight gain since beginning of pregancy.  Intervention: Discussion as noted above.   Updated goals with input from patient; she states she will be able to work on goals.    No further follow-up scheduled; encouraged patient to call with any questions or concerns.  Education Materials given:   Marland Kitchen. Goals/ instructions   Learner/ who was taught:  . Patient   Level of understanding: Marland Kitchen. Verbalizes/ demonstrates competency   Demonstrated degree of understanding via:   Teach back Learning barriers: . None  Willingness to learn/ readiness for change: . Eager, change in progress  Monitoring and Evaluation:  Dietary intake, exercise, and body weight      follow up: prn

## 2017-06-23 NOTE — Patient Instructions (Signed)
   Have a healthy snack about 4pm -- 1/2-1 sandwich with peanut butter, cheese, or (hot) lunch meat like [place]aw[place]urkey or ham; fruit and cheese or nuts; crackers and cheese or peanut butter; yogurt and fruit or granola.  If you eat canned pasta for lunch, add some fresh or frozen cut up vegetables, and a small amount of cheese for a healthier balance.   Decrease sugar in tea -- use some Splenda or sucralose or Stevia sweetener instead or do 1/2 sugar 1/2 splenda.   Increase physical activity such as walking -- walk at least 20 minutes most days of the week, 1 or more times a day.

## 2017-06-29 ENCOUNTER — Encounter: Payer: Self-pay | Admitting: Obstetrics and Gynecology

## 2017-06-29 ENCOUNTER — Ambulatory Visit (INDEPENDENT_AMBULATORY_CARE_PROVIDER_SITE_OTHER): Payer: BLUE CROSS/BLUE SHIELD | Admitting: Obstetrics and Gynecology

## 2017-06-29 VITALS — BP 98/66 | HR 80 | Wt 262.3 lb

## 2017-06-29 DIAGNOSIS — O926 Galactorrhea: Secondary | ICD-10-CM

## 2017-06-29 DIAGNOSIS — O0993 Supervision of high risk pregnancy, unspecified, third trimester: Secondary | ICD-10-CM | POA: Diagnosis not present

## 2017-06-29 DIAGNOSIS — O409XX0 Polyhydramnios, unspecified trimester, not applicable or unspecified: Secondary | ICD-10-CM

## 2017-06-29 DIAGNOSIS — O2600 Excessive weight gain in pregnancy, unspecified trimester: Secondary | ICD-10-CM

## 2017-06-29 DIAGNOSIS — O289 Unspecified abnormal findings on antenatal screening of mother: Secondary | ICD-10-CM

## 2017-06-29 LAB — POCT URINALYSIS DIPSTICK
BILIRUBIN UA: NEGATIVE
Glucose, UA: NEGATIVE
KETONES UA: NEGATIVE
Leukocytes, UA: NEGATIVE
NITRITE UA: NEGATIVE
PH UA: 7 (ref 5.0–8.0)
PROTEIN UA: NEGATIVE
RBC UA: NEGATIVE
Spec Grav, UA: 1.01 (ref 1.010–1.025)
UROBILINOGEN UA: 0.2 U/dL

## 2017-06-29 NOTE — Progress Notes (Signed)
ROB-pt doing well.

## 2017-06-29 NOTE — Progress Notes (Signed)
ROB: Patient notes milky discharge leakage from nipples, assured that it was normal. Also noting more mucoid vaginal discharge. 36 week labs done today.  Discussed that due to her obesity, she would be delivered by [redacted] weeks gestation. Will repeat ultrasound for growth at 36 weeks and f/u AFI (was borderline polyhydramnios, 24.7 cm on 2/22).  To begin weekly NSTs at 36 weeks for morbid obesity. Continued to encourage no further weight gain in pregnancy.

## 2017-06-29 NOTE — Addendum Note (Signed)
Addended by: Silvano BilisHAMPTON, Tashea Othman L on: 06/29/2017 12:13 PM   Modules accepted: Orders

## 2017-07-01 LAB — GC/CHLAMYDIA PROBE AMP
CHLAMYDIA, DNA PROBE: NEGATIVE
Neisseria gonorrhoeae by PCR: NEGATIVE

## 2017-07-01 LAB — STREP GP B NAA+RFLX: Strep Gp B NAA+Rflx: NEGATIVE

## 2017-07-06 ENCOUNTER — Ambulatory Visit (INDEPENDENT_AMBULATORY_CARE_PROVIDER_SITE_OTHER): Payer: BLUE CROSS/BLUE SHIELD | Admitting: Obstetrics and Gynecology

## 2017-07-06 ENCOUNTER — Ambulatory Visit (INDEPENDENT_AMBULATORY_CARE_PROVIDER_SITE_OTHER): Payer: BLUE CROSS/BLUE SHIELD

## 2017-07-06 ENCOUNTER — Encounter: Payer: Self-pay | Admitting: Obstetrics and Gynecology

## 2017-07-06 ENCOUNTER — Other Ambulatory Visit: Payer: Self-pay | Admitting: Obstetrics and Gynecology

## 2017-07-06 ENCOUNTER — Ambulatory Visit: Payer: BLUE CROSS/BLUE SHIELD

## 2017-07-06 VITALS — BP 102/61 | HR 77 | Wt 262.2 lb

## 2017-07-06 DIAGNOSIS — O289 Unspecified abnormal findings on antenatal screening of mother: Secondary | ICD-10-CM

## 2017-07-06 DIAGNOSIS — O0993 Supervision of high risk pregnancy, unspecified, third trimester: Secondary | ICD-10-CM

## 2017-07-06 DIAGNOSIS — O409XX0 Polyhydramnios, unspecified trimester, not applicable or unspecified: Secondary | ICD-10-CM

## 2017-07-06 DIAGNOSIS — O403XX0 Polyhydramnios, third trimester, not applicable or unspecified: Secondary | ICD-10-CM

## 2017-07-06 LAB — POCT URINALYSIS DIPSTICK
BILIRUBIN UA: NEGATIVE
GLUCOSE UA: NEGATIVE
KETONES UA: NEGATIVE
Leukocytes, UA: NEGATIVE
Nitrite, UA: NEGATIVE
Protein, UA: NEGATIVE
RBC UA: NEGATIVE
Spec Grav, UA: 1.01 (ref 1.010–1.025)
Urobilinogen, UA: 0.2 E.U./dL
pH, UA: 6.5 (ref 5.0–8.0)

## 2017-07-06 NOTE — Progress Notes (Signed)
ROB, nst, and growth scan.  NONSTRESS TEST INTERPRETATION  INDICATIONS: obesity  FHR baseline: 130 RESULTS:Reactive COMMENTS:    PLAN: 1. Continue fetal kick counts twice a day. 2. Continue antepartum testing as scheduled-Biweekly

## 2017-07-06 NOTE — Progress Notes (Signed)
ROB: Patient without complaint.  Had ultrasound today 68th percentile growth and increased AFI (26).  Leopold's maneuvers reveal vertex baby confirmed by ultrasound.  NST today reactive.  Plan biweekly NSTs with weekly AFI-scheduled for the next 2 weeks.  Discussed in detail with patient and partner.

## 2017-07-12 ENCOUNTER — Ambulatory Visit: Payer: BLUE CROSS/BLUE SHIELD

## 2017-07-12 ENCOUNTER — Ambulatory Visit (INDEPENDENT_AMBULATORY_CARE_PROVIDER_SITE_OTHER): Payer: BLUE CROSS/BLUE SHIELD | Admitting: Obstetrics and Gynecology

## 2017-07-12 ENCOUNTER — Encounter: Payer: Self-pay | Admitting: Obstetrics and Gynecology

## 2017-07-12 VITALS — BP 116/64 | HR 77 | Wt 264.7 lb

## 2017-07-12 DIAGNOSIS — O0993 Supervision of high risk pregnancy, unspecified, third trimester: Secondary | ICD-10-CM | POA: Diagnosis not present

## 2017-07-12 DIAGNOSIS — O403XX0 Polyhydramnios, third trimester, not applicable or unspecified: Secondary | ICD-10-CM

## 2017-07-12 LAB — POCT URINALYSIS DIPSTICK
BILIRUBIN UA: NEGATIVE
Blood, UA: NEGATIVE
GLUCOSE UA: NEGATIVE
KETONES UA: NEGATIVE
Leukocytes, UA: NEGATIVE
Nitrite, UA: NEGATIVE
ODOR: NEGATIVE
Protein, UA: NEGATIVE
Spec Grav, UA: 1.015 (ref 1.010–1.025)
Urobilinogen, UA: 0.2 E.U./dL
pH, UA: 7 (ref 5.0–8.0)

## 2017-07-12 NOTE — Progress Notes (Signed)
ROB:  NST - Reactive.  No complaints.  U/s on Friday.  S&S labor discusses.  Occ heartburn - discussed.  Pt will try prilosec.

## 2017-07-12 NOTE — Progress Notes (Signed)
NONSTRESS TEST INTERPRETATION  INDICATIONS: obestiy - Hydramnios  FHR baseline: 120-130 RESULTS:Reactive COMMENTS:    PLAN: 1. Continue fetal kick counts twice a day. 2. Continue antepartum testing as scheduled-Biweekly

## 2017-07-15 ENCOUNTER — Other Ambulatory Visit: Payer: Self-pay | Admitting: Obstetrics and Gynecology

## 2017-07-15 ENCOUNTER — Ambulatory Visit (INDEPENDENT_AMBULATORY_CARE_PROVIDER_SITE_OTHER): Payer: BLUE CROSS/BLUE SHIELD

## 2017-07-15 ENCOUNTER — Ambulatory Visit: Payer: BLUE CROSS/BLUE SHIELD

## 2017-07-15 DIAGNOSIS — O403XX0 Polyhydramnios, third trimester, not applicable or unspecified: Secondary | ICD-10-CM

## 2017-07-18 ENCOUNTER — Other Ambulatory Visit: Payer: Self-pay | Admitting: Obstetrics and Gynecology

## 2017-07-18 DIAGNOSIS — O403XX0 Polyhydramnios, third trimester, not applicable or unspecified: Secondary | ICD-10-CM

## 2017-07-19 ENCOUNTER — Ambulatory Visit: Payer: BLUE CROSS/BLUE SHIELD

## 2017-07-19 ENCOUNTER — Ambulatory Visit (INDEPENDENT_AMBULATORY_CARE_PROVIDER_SITE_OTHER): Payer: BLUE CROSS/BLUE SHIELD | Admitting: Obstetrics and Gynecology

## 2017-07-19 VITALS — BP 115/60 | HR 89 | Wt 262.6 lb

## 2017-07-19 DIAGNOSIS — O403XX0 Polyhydramnios, third trimester, not applicable or unspecified: Secondary | ICD-10-CM

## 2017-07-19 DIAGNOSIS — O0993 Supervision of high risk pregnancy, unspecified, third trimester: Secondary | ICD-10-CM

## 2017-07-19 LAB — POCT URINALYSIS DIPSTICK
Bilirubin, UA: NEGATIVE
Blood, UA: NEGATIVE
GLUCOSE UA: NEGATIVE
Ketones, UA: NEGATIVE
LEUKOCYTES UA: NEGATIVE
Nitrite, UA: NEGATIVE
Protein, UA: NEGATIVE
Spec Grav, UA: <=1.005 — AB (ref 1.010–1.025)
Urobilinogen, UA: 0.2 E.U./dL
pH, UA: 7 (ref 5.0–8.0)

## 2017-07-19 NOTE — Progress Notes (Signed)
ROB: Patient c/o pelvic cramping over the past several days, but denies strong contractions. Also thinks she may be leaking fluid. Patient ruled out for ROM, advised on Tylenol, warm baths for cramping. Given labor precautions. Discussed plans for IOL at 40 weeks due to morbid obesity (BMI >45). Last growth scan wnl (EFW 65%ile), and AFI no longer polyhydramnios (was 18.7 cm) lScheduled for IOL on 08/02/17 (to come around midnight). Continue antenatal testing for morbid obesity. RTC in 1 week.    NONSTRESS TEST INTERPRETATION  INDICATIONS: Obesity  FHR baseline: 125 bpm RESULTS:Reactive after monitoring for 45 minutes, administered juice.  COMMENTS: No contractions   PLAN: 1. Continue fetal kick counts twice a day. 2. Continue antepartum testing as scheduled-Biweekly

## 2017-07-19 NOTE — Patient Instructions (Signed)
Labor Induction Labor induction is when steps are taken to cause a pregnant woman to begin the labor process. Most women go into labor on their own between 37 weeks and 42 weeks of the pregnancy. When this does not happen or when there is a medical need, methods may be used to induce labor. Labor induction causes a pregnant woman's uterus to contract. It also causes the cervix to soften (ripen), open (dilate), and thin out (efface). Usually, labor is not induced before 39 weeks of the pregnancy unless there is a problem with the baby or mother. Before inducing labor, your health care provider will consider a number of factors, including the following:  The medical condition of you and the baby.  How many weeks along you are.  The status of the baby's lung maturity.  The condition of the cervix.  The position of the baby.  What are the reasons for labor induction? Labor may be induced for the following reasons:  The health of the baby or mother is at risk.  The pregnancy is overdue by 1 week or more.  The water breaks but labor does not start on its own.  The mother has a health condition or serious illness, such as high blood pressure, infection, placental abruption, or diabetes.  The amniotic fluid amounts are low around the baby.  The baby is distressed.  Convenience or wanting the baby to be born on a certain date is not a reason for inducing labor. What methods are used for labor induction? Several methods of labor induction may be used, such as:  Prostaglandin medicine. This medicine causes the cervix to dilate and ripen. The medicine will also start contractions. It can be taken by mouth or by inserting a suppository into the vagina.  Inserting a thin tube (catheter) with a balloon on the end into the vagina to dilate the cervix. Once inserted, the balloon is expanded with water, which causes the cervix to open.  Stripping the membranes. Your health care provider separates  amniotic sac tissue from the cervix, causing the cervix to be stretched and causing the release of a hormone called progesterone. This may cause the uterus to contract. It is often done during an office visit. You will be sent home to wait for the contractions to begin. You will then come in for an induction.  Breaking the water. Your health care provider makes a hole in the amniotic sac using a small instrument. Once the amniotic sac breaks, contractions should begin. This may still take hours to see an effect.  Medicine to trigger or strengthen contractions. This medicine is given through an IV access tube inserted into a vein in your arm.  All of the methods of induction, besides stripping the membranes, will be done in the hospital. Induction is done in the hospital so that you and the baby can be carefully monitored. How long does it take for labor to be induced? Some inductions can take up to 2-3 days. Depending on the cervix, it usually takes less time. It takes longer when you are induced early in the pregnancy or if this is your first pregnancy. If a mother is still pregnant and the induction has been going on for 2-3 days, either the mother will be sent home or a cesarean delivery will be needed. What are the risks associated with labor induction? Some of the risks of induction include:  Changes in fetal heart rate, such as too high, too low, or erratic.    Fetal distress.  Chance of infection for the mother and baby.  Increased chance of having a cesarean delivery.  Breaking off (abruption) of the placenta from the uterus (rare).  Uterine rupture (very rare).  When induction is needed for medical reasons, the benefits of induction may outweigh the risks. What are some reasons for not inducing labor? Labor induction should not be done if:  It is shown that your baby does not tolerate labor.  You have had previous surgeries on your uterus, such as a myomectomy or the removal of  fibroids.  Your placenta lies very low in the uterus and blocks the opening of the cervix (placenta previa).  Your baby is not in a head-down position.  The umbilical cord drops down into the birth canal in front of the baby. This could cut off the baby's blood and oxygen supply.  You have had a previous cesarean delivery.  There are unusual circumstances, such as the baby being extremely premature.  This information is not intended to replace advice given to you by your health care provider. Make sure you discuss any questions you have with your health care provider. Document Released: 08/25/2006 Document Revised: 09/11/2015 Document Reviewed: 11/02/2012 Elsevier Interactive Patient Education  2017 Elsevier Inc.    Pain Relief During Labor and Delivery Many things can cause pain during labor and delivery, including:  Pressure on bones and ligaments due to the baby moving through the pelvis.  Stretching of tissues due to the baby moving through the birth canal.  Muscle tension due to anxiety or nervousness.  The uterus tightening (contracting) and relaxing to help move the baby.  There are many ways to deal with the pain of labor and delivery. They include:  Taking prenatal classes. Taking these classes helps you know what to expect during your baby's birth. What you learn will increase your confidence and decrease your anxiety.  Practicing relaxation techniques or doing relaxing activities, such as: ? Focused breathing. ? Meditation. ? Visualization. ? Aroma therapy. ? Listening to your favorite music. ? Hypnosis.  Taking a warm shower or bath (hydrotherapy). This may: ? Provide comfort and relaxation. ? Lessen your perception of pain. ? Decrease the amount of pain medicine needed. ? Decrease the length of labor.  Getting a massage or counterpressure on your back.  Applying warm packs or ice packs.  Changing positions often, moving around, or using a birthing  ball.  Getting: ? Pain medicine through an IV or injection into a muscle. ? Pain medicine inserted into your spinal column. ? Injections of sterile water just under the skin on your lower back (intradermal injections). ? Laughing gas (nitrous oxide).  Discuss your pain control options with your health care provider during your prenatal visits. Explore the options offered by your hospital or birth center. What kinds of medicine are available? There are two kinds of medicines that can be used to relieve pain during labor and delivery:  Analgesics. These medicines decrease pain without causing you to lose feeling or the ability to move your muscles.  Anesthetics. These medicines block feeling in the body and can decrease your ability to move freely.  Both of these kinds of medicine can cause minor side effects, such as nausea, trouble concentrating, and sleepiness. They can also decrease the baby's heart rate before birth and affect the baby's breathing rate after birth. For this reason, health care providers are careful about when and how much medicine is given. What are specific medicines and procedures  that provide pain relief? Local Anesthetics Local anesthetics are used to numb a small area of the body. They may be used along with another kind of anesthetic or used to numb the nerves of the vagina, cervix, and perineum during the second stage of labor. General Anesthetics General anesthetics cause you to lose consciousness so you do not feel pain. They are usually only used for an emergency cesarean delivery. General anesthetics are given through an IV tube and a mask. Pudendal Block A pudendal block is a form of local anesthetic. It may be used to relieve the pain associated with pushing or stretching of the perineum at the time of delivery or to further numb the perineum. A pudendal block is done by injecting numbing medicine through the vaginal wall into a nerve in the pelvis. Epidural  Analgesia Epidural analgesia is given through a flexible IV catheter that is inserted into the lower back. Numbing medicine is delivered continuously to the area near your spinal column nerves (epidural space). After having this type of analgesia, you may be able to move your legs but you most likely will not be able to walk. Depending on the amount of medicine given, you may lose all feeling in the lower half of your body, or you may retain some level of sensation, including the urge to push. Epidural analgesia can be used to provide pain relief for a vaginal birth. Spinal Block A spinal block is similar to epidural analgesia, but the medicine is injected into the spinal fluid instead of the epidural space. A spinal block is only given once. It starts to relieve pain quickly, but the pain relief lasts only 1-6 hours. Spinal blocks can be used for cesarean deliveries. Combined Spinal-Epidural (CSE) Block A CSE block combines the effects of a spinal block and epidural analgesia. The spinal block works quickly to block all pain. The epidural analgesia provides continuous pain relief, even after the effects of the spinal block have worn off. This information is not intended to replace advice given to you by your health care provider. Make sure you discuss any questions you have with your health care provider. Document Released: 07/22/2008 Document Revised: 09/12/2015 Document Reviewed: 08/27/2015 Elsevier Interactive Patient Education  [employer]2018 Elsevier Inc.

## 2017-07-19 NOTE — Progress Notes (Signed)
ROB-pt is having a lot of cramps and pains in her stomach area x 5 days. Felt pain during NST. Also having pressure when she sits down. Throughout the day yesterday pt felt like she had fluid leaking.

## 2017-07-22 ENCOUNTER — Ambulatory Visit (INDEPENDENT_AMBULATORY_CARE_PROVIDER_SITE_OTHER): Payer: BLUE CROSS/BLUE SHIELD | Admitting: Obstetrics and Gynecology

## 2017-07-22 ENCOUNTER — Ambulatory Visit (INDEPENDENT_AMBULATORY_CARE_PROVIDER_SITE_OTHER): Payer: BLUE CROSS/BLUE SHIELD

## 2017-07-22 DIAGNOSIS — O0993 Supervision of high risk pregnancy, unspecified, third trimester: Secondary | ICD-10-CM | POA: Diagnosis not present

## 2017-07-22 DIAGNOSIS — O403XX0 Polyhydramnios, third trimester, not applicable or unspecified: Secondary | ICD-10-CM

## 2017-07-22 NOTE — Progress Notes (Signed)
NONSTRESS TEST INTERPRETATION  INDICATIONS: morbid obesity FHR baseline: 130 RESULTS:  reactive COMMENTS:   PLAN: 1. Continue fetal kick counts as directed. 2. Continue antepartum testing as scheduled.  Elonda Huskyavid J. Niranjan Rufener, M.D. 07/22/2017 11:37 AM

## 2017-07-26 ENCOUNTER — Ambulatory Visit (INDEPENDENT_AMBULATORY_CARE_PROVIDER_SITE_OTHER): Payer: BLUE CROSS/BLUE SHIELD | Admitting: Obstetrics and Gynecology

## 2017-07-26 ENCOUNTER — Other Ambulatory Visit: Payer: BLUE CROSS/BLUE SHIELD

## 2017-07-26 VITALS — BP 118/63 | HR 78 | Wt 264.4 lb

## 2017-07-26 DIAGNOSIS — Z3483 Encounter for supervision of other normal pregnancy, third trimester: Secondary | ICD-10-CM

## 2017-07-26 DIAGNOSIS — O0993 Supervision of high risk pregnancy, unspecified, third trimester: Secondary | ICD-10-CM

## 2017-07-26 LAB — POCT URINALYSIS DIPSTICK
Bilirubin, UA: NEGATIVE
Blood, UA: NEGATIVE
GLUCOSE UA: NEGATIVE
KETONES UA: NEGATIVE
Leukocytes, UA: NEGATIVE
Nitrite, UA: NEGATIVE
PH UA: 7.5 (ref 5.0–8.0)
Spec Grav, UA: 1.01 (ref 1.010–1.025)
UROBILINOGEN UA: 0.2 U/dL

## 2017-07-26 NOTE — Progress Notes (Signed)
NONSTRESS TEST INTERPRETATION  INDICATIONS: morbid obesity  RESULTS:  reactive COMMENTS:   PLAN: 1. Continue fetal kick counts as directed. 2. Continue antepartum testing as scheduled.  Elonda Huskyavid J. Caral Whan, M.D. 07/26/2017 11:07 AM

## 2017-07-28 ENCOUNTER — Telehealth: Payer: Self-pay | Admitting: Obstetrics and Gynecology

## 2017-07-28 NOTE — Telephone Encounter (Signed)
The patients mother called and stated that the patient needs a letter stating that the patient has been taken out of work. The fax number is [phone]. Please advise.

## 2017-07-28 NOTE — Telephone Encounter (Signed)
lmtc

## 2017-07-29 ENCOUNTER — Other Ambulatory Visit: Payer: BLUE CROSS/BLUE SHIELD

## 2017-07-29 ENCOUNTER — Ambulatory Visit (INDEPENDENT_AMBULATORY_CARE_PROVIDER_SITE_OTHER): Payer: BLUE CROSS/BLUE SHIELD | Admitting: Obstetrics and Gynecology

## 2017-07-29 ENCOUNTER — Encounter: Payer: BLUE CROSS/BLUE SHIELD | Admitting: Certified Nurse Midwife

## 2017-07-29 ENCOUNTER — Encounter: Payer: Self-pay | Admitting: Obstetrics and Gynecology

## 2017-07-29 VITALS — BP 100/67 | HR 89 | Wt 266.3 lb

## 2017-07-29 DIAGNOSIS — O0993 Supervision of high risk pregnancy, unspecified, third trimester: Secondary | ICD-10-CM | POA: Diagnosis not present

## 2017-07-29 LAB — POCT URINALYSIS DIPSTICK
Bilirubin, UA: NEGATIVE
GLUCOSE UA: NEGATIVE
KETONES UA: NEGATIVE
Nitrite, UA: NEGATIVE
Odor: NEGATIVE
Protein, UA: NEGATIVE
RBC UA: NEGATIVE
SPEC GRAV UA: 1.01 (ref 1.010–1.025)
Urobilinogen, UA: 0.2 E.U./dL
pH, UA: 7.5 (ref 5.0–8.0)

## 2017-07-29 NOTE — Telephone Encounter (Signed)
lmtrc

## 2017-07-29 NOTE — Patient Instructions (Signed)
1.  Go to labor delivery at midnight on Sunday for induction of labor

## 2017-07-29 NOTE — Progress Notes (Signed)
ROB and nst.  BP 100/67   Pulse 89   Wt 266 lb 4.8 oz (120.8 kg)   LMP 10/17/2016 (Approximate)   BMI 50.32 kg/m  Good fetal movement.  No regular contractions.  No preeclampsia symptoms.  No vaginal bleeding or vaginal discharge.  NONSTRESS TEST INTERPRETATION  INDICATIONS: morbid obesity  FHR baseline: 130s RESULTS:Reactive COMMENTS: Fundal height 41 cm; estimated fetal weight 7 pounds 8 ounces; cervix-50%/closed/-1/soft/bag of water intact   PLAN: 1. Continue fetal kick counts twice a day. 2.  Induction of labor is scheduled for 1 AM on 08/01/2017 3.  Cytotec 50 mcg every 4 hours  Darol Destinerystal Miller, CMA  Herold HarmsMartin A Jane Birkel, MD

## 2017-08-01 NOTE — Telephone Encounter (Signed)
LMTRC x2 - no response.  Pt in clinic on 4/13- no mention of needing a note.   Chart filed

## 2017-08-02 ENCOUNTER — Inpatient Hospital Stay: Payer: BLUE CROSS/BLUE SHIELD | Admitting: Anesthesiology

## 2017-08-02 ENCOUNTER — Encounter
Admission: EM | Disposition: A | Discharge status: Home / Self Care | Payer: Self-pay | Source: Home / Self Care | Attending: Obstetrics and Gynecology

## 2017-08-02 ENCOUNTER — Encounter: Payer: Self-pay | Admitting: Obstetrics and Gynecology

## 2017-08-02 ENCOUNTER — Inpatient Hospital Stay
Admission: EM | Admit: 2017-08-02 | Discharge: 2017-08-05 | DRG: 788 | Disposition: A | Discharge status: Home / Self Care | Payer: BLUE CROSS/BLUE SHIELD | Attending: Obstetrics and Gynecology | Admitting: Obstetrics and Gynecology

## 2017-08-02 ENCOUNTER — Other Ambulatory Visit: Payer: Self-pay

## 2017-08-02 DIAGNOSIS — Z8759 Personal history of other complications of pregnancy, childbirth and the puerperium: Secondary | ICD-10-CM

## 2017-08-02 DIAGNOSIS — Z3A40 40 weeks gestation of pregnancy: Secondary | ICD-10-CM

## 2017-08-02 DIAGNOSIS — O99214 Obesity complicating childbirth: Secondary | ICD-10-CM | POA: Diagnosis not present

## 2017-08-02 DIAGNOSIS — O480 Post-term pregnancy: Secondary | ICD-10-CM | POA: Diagnosis not present

## 2017-08-02 DIAGNOSIS — O98819 Other maternal infectious and parasitic diseases complicating pregnancy, unspecified trimester: Secondary | ICD-10-CM

## 2017-08-02 DIAGNOSIS — A749 Chlamydial infection, unspecified: Secondary | ICD-10-CM

## 2017-08-02 HISTORY — DX: Morbid (severe) obesity due to excess calories: E66.01

## 2017-08-02 LAB — TYPE AND SCREEN
ABO/RH(D): O POS
Antibody Screen: NEGATIVE

## 2017-08-02 LAB — CBC
HCT: 33.3 % — ABNORMAL LOW (ref 35.0–47.0)
Hemoglobin: 11.6 g/dL — ABNORMAL LOW (ref 12.0–16.0)
MCH: 30.2 pg (ref 26.0–34.0)
MCHC: 34.7 g/dL (ref 32.0–36.0)
MCV: 86.9 fL (ref 80.0–100.0)
PLATELETS: 247 10*3/uL (ref 150–440)
RBC: 3.83 MIL/uL (ref 3.80–5.20)
RDW: 14.2 % (ref 11.5–14.5)
WBC: 9.7 10*3/uL (ref 3.6–11.0)

## 2017-08-02 LAB — CHLAMYDIA/NGC RT PCR (ARMC ONLY)
Chlamydia Tr: NOT DETECTED
N gonorrhoeae: NOT DETECTED

## 2017-08-02 SURGERY — Surgical Case
Anesthesia: Epidural | Site: Abdomen | Wound class: Clean Contaminated

## 2017-08-02 MED ORDER — BUPIVACAINE LIPOSOME 1.3 % IJ SUSP: 20.0000 mL | Freq: Once | INTRAMUSCULAR | Status: DC

## 2017-08-02 MED ORDER — FENTANYL 2.5 MCG/ML W/ROPIVACAINE 0.15% IN NS 100 ML EPIDURAL (ARMC)
EPIDURAL | Status: AC
  Filled 2017-08-02: qty 100

## 2017-08-02 MED ORDER — OXYTOCIN 40 UNITS IN LACTATED RINGERS INFUSION - SIMPLE MED
2.5000 [IU]/h | INTRAVENOUS | Status: AC
  Administered 2017-08-03: 2.5 [IU]/h via INTRAVENOUS
  Filled 2017-08-02: qty 1000

## 2017-08-02 MED ORDER — FENTANYL 2.5 MCG/ML W/ROPIVACAINE 0.15% IN NS 100 ML EPIDURAL (ARMC)
EPIDURAL | Status: DC | PRN
  Administered 2017-08-02: 12 mL/h via EPIDURAL

## 2017-08-02 MED ORDER — FENTANYL CITRATE (PF) 100 MCG/2ML IJ SOLN
INTRAMUSCULAR | Status: AC
  Administered 2017-08-02: 25 ug via INTRAVENOUS
  Filled 2017-08-02: qty 2

## 2017-08-02 MED ORDER — PROPOFOL 500 MG/50ML IV EMUL
INTRAVENOUS | Status: AC
  Filled 2017-08-02: qty 50

## 2017-08-02 MED ORDER — FENTANYL CITRATE (PF) 100 MCG/2ML IJ SOLN
25.0000 ug | INTRAMUSCULAR | Status: DC | PRN
  Administered 2017-08-02 (×4): 25 ug via INTRAVENOUS

## 2017-08-02 MED ORDER — BUPIVACAINE LIPOSOME 1.3 % IJ SUSP
INTRAMUSCULAR | Status: AC
  Filled 2017-08-02: qty 20

## 2017-08-02 MED ORDER — SUCCINYLCHOLINE CHLORIDE 20 MG/ML IJ SOLN
INTRAMUSCULAR | Status: AC
  Filled 2017-08-02: qty 1

## 2017-08-02 MED ORDER — CEFAZOLIN SODIUM-DEXTROSE 2-3 GM-%(50ML) IV SOLR: INTRAVENOUS | Status: DC | PRN

## 2017-08-02 MED ORDER — PRENATAL MULTIVITAMIN CH
1.0000 | ORAL_TABLET | Freq: Every day | ORAL | Status: DC
  Administered 2017-08-03 – 2017-08-04 (×2): 1 via ORAL
  Filled 2017-08-02 (×2): qty 1

## 2017-08-02 MED ORDER — LIDOCAINE HCL (PF) 1 % IJ SOLN
INTRAMUSCULAR | Status: DC | PRN
  Administered 2017-08-02: 3 mL

## 2017-08-02 MED ORDER — SODIUM CHLORIDE 0.9 % IJ SOLN
INTRAMUSCULAR | Status: AC
  Filled 2017-08-02: qty 50

## 2017-08-02 MED ORDER — IBUPROFEN 800 MG PO TABS
800.0000 mg | ORAL_TABLET | Freq: Four times a day (QID) | ORAL | Status: DC
  Administered 2017-08-03: 800 mg via ORAL
  Filled 2017-08-02: qty 1

## 2017-08-02 MED ORDER — MISOPROSTOL 200 MCG PO TABS
ORAL_TABLET | ORAL | Status: AC
  Filled 2017-08-02: qty 4

## 2017-08-02 MED ORDER — COCONUT OIL OIL
1.0000 "application " | TOPICAL_OIL | Status: DC | PRN
  Administered 2017-08-04: 1 via TOPICAL
  Filled 2017-08-02: qty 120

## 2017-08-02 MED ORDER — MIDAZOLAM HCL 2 MG/2ML IJ SOLN
INTRAMUSCULAR | Status: DC | PRN
  Administered 2017-08-02: 2 mg via INTRAVENOUS

## 2017-08-02 MED ORDER — PROPOFOL 10 MG/ML IV BOLUS
INTRAVENOUS | Status: DC | PRN
  Administered 2017-08-02: 150 mg via INTRAVENOUS

## 2017-08-02 MED ORDER — LACTATED RINGERS IV SOLN: INTRAVENOUS | Status: DC

## 2017-08-02 MED ORDER — FERROUS SULFATE 325 (65 FE) MG PO TABS
325.0000 mg | ORAL_TABLET | Freq: Two times a day (BID) | ORAL | Status: DC
  Administered 2017-08-03 – 2017-08-05 (×5): 325 mg via ORAL
  Filled 2017-08-02 (×5): qty 1

## 2017-08-02 MED ORDER — MORPHINE SULFATE (PF) 0.5 MG/ML IJ SOLN
INTRAMUSCULAR | Status: DC | PRN
  Administered 2017-08-02: 3 mg via EPIDURAL

## 2017-08-02 MED ORDER — OXYCODONE-ACETAMINOPHEN 5-325 MG PO TABS
2.0000 | ORAL_TABLET | ORAL | Status: DC | PRN
  Filled 2017-08-02: qty 2

## 2017-08-02 MED ORDER — BUPIVACAINE HCL (PF) 0.5 % IJ SOLN
INTRAMUSCULAR | Status: AC
  Filled 2017-08-02: qty 30

## 2017-08-02 MED ORDER — OXYTOCIN 40 UNITS IN LACTATED RINGERS INFUSION - SIMPLE MED
1.0000 m[IU]/min | INTRAVENOUS | Status: DC
  Administered 2017-08-02: 2 m[IU]/min via INTRAVENOUS

## 2017-08-02 MED ORDER — SODIUM CHLORIDE 0.9 % IV SOLN
INTRAVENOUS | Status: DC | PRN
  Administered 2017-08-02: 50 mL

## 2017-08-02 MED ORDER — SIMETHICONE 80 MG PO CHEW
80.0000 mg | CHEWABLE_TABLET | ORAL | Status: DC
  Administered 2017-08-04 – 2017-08-05 (×2): 80 mg via ORAL
  Filled 2017-08-02 (×2): qty 1

## 2017-08-02 MED ORDER — SODIUM CHLORIDE 0.9 % IV SOLN
INTRAVENOUS | Status: DC | PRN
  Administered 2017-08-02: 20 ug/min via INTRAVENOUS

## 2017-08-02 MED ORDER — LACTATED RINGERS IV SOLN
INTRAVENOUS | Status: DC
  Administered 2017-08-02 (×3): via INTRAVENOUS

## 2017-08-02 MED ORDER — MISOPROSTOL 50MCG HALF TABLET
50.0000 ug | ORAL_TABLET | ORAL | Status: DC | PRN
  Administered 2017-08-02: 50 ug via VAGINAL
  Filled 2017-08-02: qty 1

## 2017-08-02 MED ORDER — MENTHOL 3 MG MT LOZG
1.0000 | LOZENGE | OROMUCOSAL | Status: DC | PRN
  Filled 2017-08-02 (×2): qty 9

## 2017-08-02 MED ORDER — MISOPROSTOL 25 MCG QUARTER TABLET
ORAL_TABLET | ORAL | Status: AC
  Filled 2017-08-02: qty 1

## 2017-08-02 MED ORDER — SIMETHICONE 80 MG PO CHEW
80.0000 mg | CHEWABLE_TABLET | ORAL | Status: DC | PRN
  Filled 2017-08-02: qty 1

## 2017-08-02 MED ORDER — ONDANSETRON HCL 4 MG/2ML IJ SOLN
INTRAMUSCULAR | Status: DC | PRN
  Administered 2017-08-02: 4 mg via INTRAVENOUS

## 2017-08-02 MED ORDER — OXYTOCIN 40 UNITS IN LACTATED RINGERS INFUSION - SIMPLE MED
INTRAVENOUS | Status: AC
  Filled 2017-08-02: qty 1000

## 2017-08-02 MED ORDER — ZOLPIDEM TARTRATE 5 MG PO TABS: 5.0000 mg | ORAL_TABLET | Freq: Every evening | ORAL | Status: DC | PRN

## 2017-08-02 MED ORDER — ACETAMINOPHEN 325 MG PO TABS: 650.0000 mg | ORAL_TABLET | ORAL | Status: DC | PRN

## 2017-08-02 MED ORDER — LIDOCAINE HCL (PF) 1 % IJ SOLN: 30.0000 mL | INTRAMUSCULAR | Status: DC | PRN

## 2017-08-02 MED ORDER — SUCCINYLCHOLINE CHLORIDE 20 MG/ML IJ SOLN
INTRAMUSCULAR | Status: DC | PRN
  Administered 2017-08-02: 100 mg via INTRAVENOUS

## 2017-08-02 MED ORDER — ONDANSETRON HCL 4 MG/2ML IJ SOLN: 4.0000 mg | Freq: Once | INTRAMUSCULAR | Status: DC | PRN

## 2017-08-02 MED ORDER — LACTATED RINGERS IV SOLN
500.0000 mL | INTRAVENOUS | Status: DC | PRN
  Administered 2017-08-02 (×5): 500 mL via INTRAVENOUS

## 2017-08-02 MED ORDER — PROPOFOL 10 MG/ML IV BOLUS
INTRAVENOUS | Status: AC
  Filled 2017-08-02: qty 20

## 2017-08-02 MED ORDER — MISOPROSTOL 25 MCG QUARTER TABLET
25.0000 ug | ORAL_TABLET | ORAL | Status: DC
  Administered 2017-08-02: 25 ug via VAGINAL

## 2017-08-02 MED ORDER — OXYTOCIN BOLUS FROM INFUSION: 500.0000 mL | Freq: Once | INTRAVENOUS | Status: DC

## 2017-08-02 MED ORDER — DEXTROSE 5 % IV SOLN
3.0000 g | INTRAVENOUS | Status: AC
  Administered 2017-08-02: 3 g via INTRAVENOUS
  Filled 2017-08-02: qty 2000

## 2017-08-02 MED ORDER — BUPIVACAINE HCL (PF) 0.25 % IJ SOLN
INTRAMUSCULAR | Status: DC | PRN
  Administered 2017-08-02 (×2): 4 mL via EPIDURAL

## 2017-08-02 MED ORDER — MAGNESIUM HYDROXIDE 400 MG/5ML PO SUSP: 30.0000 mL | ORAL | Status: DC | PRN

## 2017-08-02 MED ORDER — OXYTOCIN 40 UNITS IN LACTATED RINGERS INFUSION - SIMPLE MED
INTRAVENOUS | Status: DC | PRN
  Administered 2017-08-02: 1 mL via INTRAVENOUS
  Administered 2017-08-02: 299 mL via INTRAVENOUS

## 2017-08-02 MED ORDER — MORPHINE SULFATE (PF) 2 MG/ML IV SOLN: 1.0000 mg | INTRAVENOUS | Status: AC | PRN

## 2017-08-02 MED ORDER — ONDANSETRON HCL 4 MG/2ML IJ SOLN
INTRAMUSCULAR | Status: AC
  Filled 2017-08-02: qty 2

## 2017-08-02 MED ORDER — STERILE WATER FOR INJECTION IJ SOLN
INTRAMUSCULAR | Status: AC
  Filled 2017-08-02: qty 50

## 2017-08-02 MED ORDER — LIDOCAINE-EPINEPHRINE (PF) 1.5 %-1:200000 IJ SOLN
INTRAMUSCULAR | Status: DC | PRN
  Administered 2017-08-02: 3 mL via EPIDURAL

## 2017-08-02 MED ORDER — FENTANYL CITRATE (PF) 100 MCG/2ML IJ SOLN
INTRAMUSCULAR | Status: AC
  Filled 2017-08-02: qty 2

## 2017-08-02 MED ORDER — SENNOSIDES-DOCUSATE SODIUM 8.6-50 MG PO TABS
2.0000 | ORAL_TABLET | ORAL | Status: DC
  Administered 2017-08-04 – 2017-08-05 (×2): 2 via ORAL
  Filled 2017-08-02 (×2): qty 2

## 2017-08-02 MED ORDER — FENTANYL CITRATE (PF) 100 MCG/2ML IJ SOLN
INTRAMUSCULAR | Status: DC | PRN
  Administered 2017-08-02: 100 ug via INTRAVENOUS

## 2017-08-02 MED ORDER — LIDOCAINE HCL (PF) 1 % IJ SOLN
INTRAMUSCULAR | Status: AC
  Filled 2017-08-02: qty 30

## 2017-08-02 MED ORDER — BUTORPHANOL TARTRATE 2 MG/ML IJ SOLN
1.0000 mg | INTRAMUSCULAR | Status: DC | PRN
Start: 2017-08-02 — End: 2017-08-02

## 2017-08-02 MED ORDER — TERBUTALINE SULFATE 1 MG/ML IJ SOLN
0.2500 mg | Freq: Once | INTRAMUSCULAR | Status: AC | PRN
  Administered 2017-08-02: 0.25 mg via SUBCUTANEOUS
  Filled 2017-08-02: qty 1

## 2017-08-02 MED ORDER — DIPHENHYDRAMINE HCL 25 MG PO CAPS: 25.0000 mg | ORAL_CAPSULE | Freq: Four times a day (QID) | ORAL | Status: DC | PRN

## 2017-08-02 MED ORDER — AMMONIA AROMATIC IN INHA
RESPIRATORY_TRACT | Status: AC
  Filled 2017-08-02: qty 10

## 2017-08-02 MED ORDER — OXYTOCIN 40 UNITS IN LACTATED RINGERS INFUSION - SIMPLE MED
2.5000 [IU]/h | INTRAVENOUS | Status: DC
  Filled 2017-08-02: qty 1000

## 2017-08-02 MED ORDER — OXYTOCIN 10 UNIT/ML IJ SOLN
INTRAMUSCULAR | Status: AC
  Filled 2017-08-02: qty 2

## 2017-08-02 MED ORDER — OXYCODONE-ACETAMINOPHEN 5-325 MG PO TABS: 1.0000 | ORAL_TABLET | ORAL | Status: DC | PRN

## 2017-08-02 MED ORDER — CEFAZOLIN SODIUM-DEXTROSE 2-3 GM-%(50ML) IV SOLR
INTRAVENOUS | Status: DC | PRN
  Administered 2017-08-02: 3 g via INTRAVENOUS

## 2017-08-02 MED ORDER — OXYCODONE-ACETAMINOPHEN 5-325 MG PO TABS
1.0000 | ORAL_TABLET | ORAL | Status: DC | PRN
  Administered 2017-08-03 – 2017-08-05 (×12): 1 via ORAL
  Filled 2017-08-02 (×11): qty 1

## 2017-08-02 MED ORDER — WITCH HAZEL-GLYCERIN EX PADS: 1.0000 "application " | MEDICATED_PAD | CUTANEOUS | Status: DC | PRN

## 2017-08-02 MED ORDER — MIDAZOLAM HCL 2 MG/2ML IJ SOLN
INTRAMUSCULAR | Status: AC
  Filled 2017-08-02: qty 2

## 2017-08-02 MED ORDER — MORPHINE SULFATE (PF) 0.5 MG/ML IJ SOLN
INTRAMUSCULAR | Status: AC
  Filled 2017-08-02: qty 10

## 2017-08-02 MED ORDER — SOD CITRATE-CITRIC ACID 500-334 MG/5ML PO SOLN
30.0000 mL | ORAL | Status: DC | PRN
  Administered 2017-08-02: 30 mL via ORAL
  Filled 2017-08-02: qty 15

## 2017-08-02 MED ORDER — OXYCODONE-ACETAMINOPHEN 5-325 MG PO TABS: 2.0000 | ORAL_TABLET | ORAL | Status: DC | PRN

## 2017-08-02 MED ORDER — LIDOCAINE 5 % EX PTCH
1.0000 | MEDICATED_PATCH | CUTANEOUS | Status: DC
  Filled 2017-08-02: qty 1

## 2017-08-02 MED ORDER — ONDANSETRON HCL 4 MG/2ML IJ SOLN: 4.0000 mg | Freq: Four times a day (QID) | INTRAMUSCULAR | Status: DC | PRN

## 2017-08-02 MED ORDER — DIBUCAINE 1 % RE OINT: 1.0000 "application " | TOPICAL_OINTMENT | RECTAL | Status: DC | PRN

## 2017-08-02 MED ORDER — BUPIVACAINE HCL 0.5 % IJ SOLN
INTRAMUSCULAR | Status: DC | PRN
  Administered 2017-08-02: 20 mL

## 2017-08-02 SURGICAL SUPPLY — 24 items
BAG COUNTER SPONGE EZ (MISCELLANEOUS) ×2 IMPLANT
CANISTER SUCT 3000ML PPV (MISCELLANEOUS) ×2 IMPLANT
CHLORAPREP W/TINT 26ML (MISCELLANEOUS) ×4 IMPLANT
DRSG TELFA 3X8 NADH (GAUZE/BANDAGES/DRESSINGS) ×2 IMPLANT
ELECT REM PT RETURN 9FT ADLT (ELECTROSURGICAL) ×2
ELECTRODE REM PT RTRN 9FT ADLT (ELECTROSURGICAL) ×1 IMPLANT
GAUZE SPONGE 4X4 12PLY STRL (GAUZE/BANDAGES/DRESSINGS) ×2 IMPLANT
GLOVE BIO SURGEON STRL SZ 6.5 (GLOVE) ×8 IMPLANT
GLOVE INDICATOR 7.0 STRL GRN (GLOVE) ×8 IMPLANT
GOWN STRL REUS W/ TWL LRG LVL3 (GOWN DISPOSABLE) ×4 IMPLANT
GOWN STRL REUS W/TWL LRG LVL3 (GOWN DISPOSABLE) ×4
KIT TURNOVER KIT A (KITS) ×2 IMPLANT
NS IRRIG 1000ML POUR BTL (IV SOLUTION) ×2 IMPLANT
PACK C SECTION AR (MISCELLANEOUS) ×2 IMPLANT
PAD OB MATERNITY 4.3X12.25 (PERSONAL CARE ITEMS) ×2 IMPLANT
PAD PREP 24X41 OB/GYN DISP (PERSONAL CARE ITEMS) ×2 IMPLANT
RETRACTOR TRAXI PANNICULUS (MISCELLANEOUS) ×1 IMPLANT
SPONGE LAP 18X18 5 PK (GAUZE/BANDAGES/DRESSINGS) ×2 IMPLANT
SUT MNCRL AB 4-0 PS2 18 (SUTURE) ×2 IMPLANT
SUT PLAIN 2 0 XLH (SUTURE) IMPLANT
SUT VIC AB 0 CT1 36 (SUTURE) ×4 IMPLANT
SUT VIC AB 3-0 SH 27 (SUTURE) ×1
SUT VIC AB 3-0 SH 27X BRD (SUTURE) ×1 IMPLANT
TRAXI PANNICULUS RETRACTOR (MISCELLANEOUS) ×1

## 2017-08-02 NOTE — H&P (Signed)
Obstetric History and Physical  Casey Fuentes is a 22 y.o. G1P0 with IUP at 2058w1d presenting for induction of labor secondary to morbid obesity. Patient states she has been having  none contractions, none vaginal bleeding, intact membranes, with active fetal movement.    Prenatal Course Source of Care: [facility] with onset of care at 12 weeks Pregnancy complications or risks: Patient Active Problem List   Diagnosis Date Noted  . History of polyhydramnios 08/02/2017  . Morbid obesity (HCC) 06/01/2017  . Excessive weight gain affecting pregnancy 05/18/2017  . Supervision of high risk pregnancy in third trimester 05/05/2017  . History of maternal Chlamydia infection, currently pregnant in first trimester 02/03/2017   She plans to breastfeed She desires unsure method for postpartum contraception.   Prenatal labs and studies: ABO, Rh: --/--/O POS (04/16 16100332) Antibody: NEG (04/16 0332) Rubella: 1.53 (09/28 1118) RPR: Non Reactive (01/30 1046)  HBsAg: Negative (09/28 1118)  HIV: Non Reactive (09/28 1118)  GBS: Negative (06/29/2017) 1 hr Glucola: normal Genetic screening normal Anatomy US normal   Past Medical History:  Diagnosis Date  . Missed menses   . Morbid obesity (HCC) 06/01/2017    Past Surgical History:  Procedure Laterality Date  . TONSILLECTOMY      OB History  Gravida Para Term Preterm AB Living  1            SAB TAB Ectopic Multiple Live Births               # Outcome Date GA Lbr Len/2nd Weight Sex Delivery Anes PTL Lv  1 Current             Social History   Socioeconomic History  . Marital status: Single    Spouse name: Not on file  . Number of children: Not on file  . Years of education: Not on file  . Highest education level: Not on file  Occupational History  . Not on file  Social Needs  . Financial resource strain: Not on file  . Food insecurity:    Worry: Not on file    Inability: Not on file  . Transportation needs:   Medical: Not on file    Non-medical: Not on file  Tobacco Use  . Smoking status: Never Smoker  . Smokeless tobacco: Never Used  Substance and Sexual Activity  . Alcohol use: No  . Drug use: No  . Sexual activity: Yes    Partners: Male    Birth control/protection: Pill  Lifestyle  . Physical activity:    Days per week: Not on file    Minutes per session: Not on file  . Stress: Not on file  Relationships  . Social connections:    Talks on phone: Not on file    Gets together: Not on file    Attends religious service: Not on file    Active member of club or organization: Not on file    Attends meetings of clubs or organizations: Not on file    Relationship status: Not on file  Other Topics Concern  . Not on file  Social History Narrative  . Not on file    Family History  Problem Relation Age of Onset  . Diabetes Mother   . Thyroid disease Mother   . Cirrhosis Mother     Medications Prior to Admission  Medication Sig Dispense Refill Last Dose  . Prenatal Vit-Fe Fumarate-FA (PRENATAL MULTIVITAMIN) TABS tablet Take 1 tablet by mouth daily at  12 noon.   Past Month at Unknown time    Allergies  Allergen Reactions  . Amoxicillin     Review of Systems: Negative except for what is mentioned in HPI.  Physical Exam: BP (!) 89/52 (BP Location: Left Arm)   Pulse (!) 102   Temp 98.3 F (36.8 C) (Oral)   Resp 18   Ht 5\' 1"  (1.549 m)   Wt 264 lb (119.7 kg)   LMP 10/17/2016 (Approximate)   BMI 49.88 kg/m  CONSTITUTIONAL: Well-developed, well-nourished female in no acute distress. Morbidly obese HENT:  Normocephalic, atraumatic, External right and left ear normal. Oropharynx is clear and moist EYES: Conjunctivae and EOM are normal. Pupils are equal, round, and reactive to light. No scleral icterus.  NECK: Normal range of motion, supple, no masses SKIN: Skin is warm and dry. No rash noted. Not diaphoretic. No erythema. No pallor. NEUROLOGIC: Alert and oriented to person,  place, and time. Normal reflexes, muscle tone coordination. No cranial nerve deficit noted. PSYCHIATRIC: Normal mood and affect. Normal behavior. Normal judgment and thought content. CARDIOVASCULAR: Normal heart rate noted, regular rhythm RESPIRATORY: Effort and breath sounds normal, no problems with respiration noted ABDOMEN: Soft, nontender, nondistended, gravid. MUSCULOSKELETAL: Normal range of motion. No edema and no tenderness. 2+ distal pulses.  Cervical Exam: Dilatation 1 cm   Effacement 60%   Station -1   Presentation: cephalic FHT:  Baseline rate 145 bpm   Variability moderate  Accelerations present   Decelerations none.  Patient did have 2 late decelerations at approximately 5:30 a.m. and tachysystole, resolved with 1 dose of terbutaline.  Contractions: Every 2 mins   Pertinent Labs/Studies:   Results for orders placed or performed during the hospital encounter of 08/02/17 (from the past 24 hour(s))  CBC     Status: Abnormal   Collection Time: 08/02/17  1:44 AM  Result Value Ref Range   WBC 9.7 3.6 - 11.0 K/uL   RBC 3.83 3.80 - 5.20 MIL/uL   Hemoglobin 11.6 (L) 12.0 - 16.0 g/dL   HCT 40.9 (L) 81.1 - 91.4 %   MCV 86.9 80.0 - 100.0 fL   MCH 30.2 26.0 - 34.0 pg   MCHC 34.7 32.0 - 36.0 g/dL   RDW 78.2 95.6 - 21.3 %   Platelets 247 150 - 440 K/uL  Type and screen     Status: None   Collection Time: 08/02/17  3:32 AM  Result Value Ref Range   ABO/RH(D) O POS    Antibody Screen NEG    Sample Expiration      08/05/2017 Performed at [facility], [address] [place], [place] [address]     Assessment : Casey Fuentes is a 22 y.o. G1P0 at [redacted]w[redacted]d being admitted for induction of labor due to morbid obesity.  Plan: Labor:  Induction with Cytotec as ordered as per protocol. Decreased dosing from 50 mcg to 25 mcg in light of fetal tracing abnormalities with prior dose. Analgesia as needed. FWB: Reassuring fetal heart tracing.  GBS negative Delivery plan: Hopeful  for vaginal delivery   Hildred Laser, MD [facility]

## 2017-08-02 NOTE — Anesthesia Procedure Notes (Signed)
Epidural Patient location during procedure: OB Start time: 08/02/2017 1:55 PM End time: 08/02/2017 2:16 PM  Staffing Anesthesiologist: Lenard SimmerKarenz, Andrew, MD Resident/CRNA: Malva CoganBeane, Elin Seats, CRNA Performed: resident/CRNA   Preanesthetic Checklist Completed: patient identified, site marked, surgical consent, pre-op evaluation, timeout performed, IV checked, risks and benefits discussed and monitors and equipment checked  Epidural Patient position: sitting Prep: Betadine Patient monitoring: heart rate, continuous pulse ox and blood pressure Approach: midline Location: L3-L4 Injection technique: LOR saline  Needle:  Needle type: Tuohy  Needle gauge: 17 G Needle length: 9 cm and 9 Needle insertion depth: 9 cm Catheter type: closed end flexible Catheter size: 19 Gauge Catheter at skin depth: 14 cm Test dose: negative and 1.5% lidocaine with Epi 1:200 K  Assessment Sensory level: T10 Events: blood not aspirated, injection not painful, no injection resistance, negative IV test and no paresthesia  Additional Notes 1st attempt Pt. Evaluated and documentation done after procedure finished. Patient identified. Risks/Benefits/Options discussed with patient including but not limited to bleeding, infection, nerve damage, paralysis, failed block, incomplete pain control, headache, blood pressure changes, nausea, vomiting, reactions to medication both or allergic, itching and postpartum back pain. Confirmed with bedside nurse the patient's most recent platelet count. Confirmed with patient that they are not currently taking any anticoagulation, have any bleeding history or any family history of bleeding disorders. Patient expressed understanding and wished to proceed. All questions were answered. Sterile technique was used throughout the entire procedure. Please see nursing notes for vital signs. Test dose was given through epidural catheter and negative prior to continuing to dose epidural or start  infusion. Warning signs of high block given to the patient including shortness of breath, tingling/numbness in hands, complete motor block, or any concerning symptoms with instructions to call for help. Patient was given instructions on fall risk and not to get out of bed. All questions and concerns addressed with instructions to call with any issues or inadequate analgesia.   Patient tolerated the insertion well without immediate complications.Reason for block:procedure for pain

## 2017-08-02 NOTE — Anesthesia Preprocedure Evaluation (Addendum)
Anesthesia Evaluation  Patient identified by MRN, date of birth, ID band Patient awake    Reviewed: Allergy & Precautions, NPO status , Patient's Chart, lab work & pertinent test results, reviewed documented beta blocker date and time   Airway Mallampati: II  TM Distance: >3 FB Neck ROM: Full    Dental   Pulmonary           Cardiovascular      Neuro/Psych    GI/Hepatic GERD  ,  Endo/Other  Morbid obesity  Renal/GU      Musculoskeletal   Abdominal   Peds  Hematology   Anesthesia Other Findings   Reproductive/Obstetrics (+) Pregnancy                             Anesthesia Physical Anesthesia Plan  ASA: II  Anesthesia Plan: Epidural   Post-op Pain Management:    Induction:   PONV Risk Score and Plan:   Airway Management Planned:   Additional Equipment:   Intra-op Plan:   Post-operative Plan:   Informed Consent:   Plan Discussed with: Anesthesiologist and CRNA  Anesthesia Plan Comments: (Plan to use epidural for C-S .  Patient understands and agrees.  Labor nursing staff relayed that the epidural has been functioning well.)      Anesthesia Quick Evaluation

## 2017-08-02 NOTE — Op Note (Addendum)
Cesarean Section Procedure Note  Indications: non-reassuring fetal status (fetal intolerance to labor)  Pre-operative Diagnosis: 40 week 1 day pregnancy, fetal intolerance in labor, morbid obesity (BMI 49), thick meconium fluid.  Post-operative Diagnosis: Same  Surgeon: Hildred LaserAnika Caison Hearn, MD  Assistants: Surgical Scrub Tech  Procedure: Primary low transverse Cesarean Section  Anesthesia: General anesthesia  Procedure Details: The patient was seen in the Holding Room. The risks, benefits, complications, treatment options, and expected outcomes were discussed with the patient.  The patient concurred with the proposed plan, giving informed consent.  The site of surgery properly noted/marked. The patient was taken to the Operating Room, identified as Casey Fuentes and the procedure verified as C-Section Delivery. A Time Out was held and the above information confirmed.  Patient had an epidural in place, however after re-dosing, was found to be inadequate for surgical procedure (patient was still able to detect sharp sensations).  The decision was made to proceed to general anesthesia.  induction of anesthesia, the patient was draped and prepped in the usual sterile manner. Anesthesia was tested and noted to be adequate. A Pfannenstiel incision was made and carried down through the subcutaneous tissue to the fascia. Fascial incision was made and extended transversely. The fascia was separated from the underlying rectus tissue superiorly and inferiorly. The peritoneum was identified and entered. Peritoneal incision was extended longitudinally. The utero-vesical peritoneal reflection was incised transversely and the bladder flap was bluntly freed from the lower uterine segment. A low transverse uterine incision was made. Delivered from cephalic presentation was a 3480 gram Female with Apgar scores of 3 at one minute, 7 at five minutes, and 9 at ten minutes.  There was difficulty delivering the head as it was  deep in the pelvis, with OP position.  Vaginal assistance for delivery of the head was required, with upward pressure administered by the nurse on the fetal head. After the umbilical cord was clamped and cut, cord blood was obtained for evaluation. A cord gas was also collected.The placenta was removed intact and appeared normal. The uterus was exteriorized and cleared of all clots and debris. The uterine outline, tubes and ovaries appeared normal.  The uterine incision was closed with running locked sutures of 0-Vicryl.  A second suture of 0-Vicryl was used in an imbricating layer.  Hemostasis was observed. Lavage was carried out until clear. The fascia was then reapproximated with a running suture of 0-Vicryl. The fascia was then injected with approximately 20 ml of Exparel. The subcutaneous fat layer was reapproximated with 3-0 Vicryl. The skin was reapproximated with 4-0 Monocryl.  The skin and subcutaneous tissues were then injected with an additional 20 ml of Exparel.   Instrument, sponge, and needle counts were correct prior the abdominal closure and at the conclusion of the case.   Findings: Female infant, cephalic presentation, 3480 grams, with Apgar scores of 3 at one minute, 7 at five minutes, and 9 at 7 minutes. Intact placenta with 3 vessel cord.  Thick meconium after uterine incision.  The uterine outline, tubes and ovaries appeared normal.   Estimated Blood Loss:  600 ml  Drains: foley catheter to gravity drainage, 400 ml clear urine at end of the procedure         Total IV Fluids:  800 ml  Specimens: Cord blood          Implants: None         Complications:  None; patient tolerated the procedure well.  Disposition: PACU - hemodynamically stable.         Condition: stable   Hildred Laser, MD [facility]

## 2017-08-02 NOTE — Anesthesia Post-op Follow-up Note (Signed)
Anesthesia QCDR form completed.        

## 2017-08-02 NOTE — Anesthesia Procedure Notes (Signed)
Procedure Name: Intubation Date/Time: 08/02/2017 7:34 PM Performed by: Waldo LaineJustis, Cara Thaxton, CRNA Pre-anesthesia Checklist: Patient identified, Patient being monitored, Timeout performed, Emergency Drugs available and Suction available Patient Re-evaluated:Patient Re-evaluated prior to induction Oxygen Delivery Method: Circle system utilized Preoxygenation: Pre-oxygenation with 100% oxygen Induction Type: IV induction, Rapid sequence and Cricoid Pressure applied Laryngoscope Size: Miller and 2 Grade View: Grade II Tube type: Oral Tube size: 7.0 mm Number of attempts: 1 Airway Equipment and Method: Stylet Placement Confirmation: ETT inserted through vocal cords under direct vision,  positive ETCO2 and breath sounds checked- equal and bilateral Secured at: 20 cm Tube secured with: Tape Dental Injury: Teeth and Oropharynx as per pre-operative assessment

## 2017-08-02 NOTE — Progress Notes (Signed)
  Foley bulb expelled after ambulation to the restroom. Cervix now 3.5/80/-2.  Will continue with Pitocin augmentation. Patient desires epidural. Will notify Anesthesia.    Hildred Laserherry, Maryan Sivak, MD [facility]

## 2017-08-02 NOTE — Transfer of Care (Signed)
Immediate Anesthesia Transfer of Care Note  Patient: Edsel PetrinKaitlyn M Thome  Procedure(s) Performed: CESAREAN SECTION (N/A Abdomen)  Patient Location: PACU  Anesthesia Type:General  Level of Consciousness: awake and patient cooperative  Airway & Oxygen Therapy: Patient Spontanous Breathing and Patient connected to nasal cannula oxygen  Post-op Assessment: Report given to RN and Post -op Vital signs reviewed and stable  Post vital signs: Reviewed and stable  Last Vitals:  Vitals Value Taken Time  BP    Temp    Pulse 96 08/02/2017  8:42 PM  Resp 16 08/02/2017  8:37 PM  SpO2 100 % 08/02/2017  8:42 PM  Vitals shown include unvalidated device data.  Last Pain:  Vitals:   08/02/17 1639  TempSrc: Oral  PainSc:          Complications: No apparent anesthesia complications

## 2017-08-02 NOTE — Progress Notes (Signed)
Intrapartum Progress Note  S: Patient recently s/p epidural.  Still notes some feeling but is getting more numb.   O: Blood pressure 123/81, pulse 70, temperature 98.5 F (36.9 C), temperature source Oral, resp. rate 18, height 5\' 1"  (1.549 m), weight 264 lb (119.7 kg), last menstrual period 10/17/2016, SpO2 100 %. Gen App: NAD, comfortable Abdomen: soft, gravid FHT: baseline 120 bpm.  Accels present.  Decels present - late decelerations. moderate in degree variability.   Tocometer: contractions q 2 minutes Cervix: 3.5-4/80/-2 Extremities: Nontender, no edema.  Pitocin: 4 mIU  Labs:  No new labs   Assessment:  1: SIUP at 6657w1d 2. Category II tracing  Plan:  1. AROM'd with thick meconium.  IUPC and scalp electrode placed.  Decelerations noted from baseline to 50s with placement of internal monitors.  Patient placed back in right lateral tilt, which is when fetus is most tolerable. Decelerations resolved after approximately 2 minutes.   2.Pitocin held due to decelerations.  3. Discussion had with patient that if we were unable to augment her labor due to fetal intolerance to augmentation agents and she had no further progression on her own after AROM, she would likely need a C-section.  Patient and family note understanding.    Hildred Laserherry, Glenora Morocho, MD 08/02/2017 5:45 PM

## 2017-08-02 NOTE — Progress Notes (Signed)
Intrapartum Progress Note  S: Patient without major complaints.  O: Blood pressure 123/81, pulse 70, temperature 98.5 F (36.9 C), temperature source Oral, resp. rate 18, height 5\' 1"  (1.549 m), weight 264 lb (119.7 kg), last menstrual period 10/17/2016, SpO2 100 %. Gen App: NAD, comfortable Abdomen: soft, gravid FHT: baseline 125 bpm.  Accels present.  Decels present (late and variable decelerations present). Moderate in degree variability.   Tocometer: contractions q 1-2 minutes, irregular Cervix: 4/80/-2 Extremities: Nontender, no edema.  Pitocin: None   Labs:  No new labs   Assessment:  1: SIUP at 7044w1d 2. Category II tracing  Plan:  1. Patient continues with Category II tracing, contractions q 2 minutes, appears adequate with IUPC. No further cervical change noted.  Unable to attempt any other measures of augmentation secondary to fetal tracing. At this time a primary C-section is recommended.  The risks of cesarean section were discussed with the patient including but were not limited to: bleeding which may require transfusion or reoperation; infection which may require antibiotics; injury to bowel, bladder, ureters or other surrounding organs; injury to the fetus; need for additional procedures including hysterectomy in the event of a life-threatening hemorrhage; placental abnormalities wth subsequent pregnancies, incisional problems, thromboembolic phenomenon and other postoperative/anesthesia complications. Anesthesia and OR aware.  Preoperative prophylactic antibiotics and SCDs ordered on call to the OR.  To OR when ready.     Hildred Laserherry, Kaya Pottenger, MD 08/02/2017 5:41 PM

## 2017-08-02 NOTE — Plan of Care (Signed)
Reviewed admission and plan of care with patient. All questions answered. Will continue to monitor.

## 2017-08-02 NOTE — Progress Notes (Signed)
Intrapartum Progress Note  S: Patient without major complaints. Feels her contractions but not strong. Is s/p 2 doses of cytotec.   O: Blood pressure (!) 122/55, pulse 83, temperature 98.4 F (36.9 C), temperature source Oral, resp. rate 18, height 5\' 1"  (1.549 m), weight 264 lb (119.7 kg), last menstrual period 10/17/2016. Gen App: NAD, comfortable Abdomen: soft, gravid FHT: baseline 125 bpm.  Accels present.  Decels present (late decelerations present, 3 consecutive occurring at approximately 11:30 a.m.Marland Kitchen. Patient on right side with facemask O2. . moderate in degree variability.   Tocometer: contractions q 1-2 minutes, irregular Cervix: 1/60/-2 Extremities: Nontender, no edema.  Pitocin: None  Labs:  No new labs   Assessment:  1: SIUP at 2216w1d 2. Category II tracing  Plan:  1. Patient in left lateral, fetus has recovered.   2. Foley bulb placed.  Will discontinue Cytotec and change to low-dose pitocin as it is easily titratable.  3. Analgesia as desired.    Hildred Laserherry, Maddex Garlitz, MD 08/02/2017 12:46 PM

## 2017-08-03 ENCOUNTER — Encounter: Payer: Self-pay | Admitting: Obstetrics and Gynecology

## 2017-08-03 LAB — CBC
HEMATOCRIT: 30.4 % — AB (ref 35.0–47.0)
HEMOGLOBIN: 10.4 g/dL — AB (ref 12.0–16.0)
MCH: 29.9 pg (ref 26.0–34.0)
MCHC: 34.1 g/dL (ref 32.0–36.0)
MCV: 87.8 fL (ref 80.0–100.0)
Platelets: 190 10*3/uL (ref 150–440)
RBC: 3.47 MIL/uL — AB (ref 3.80–5.20)
RDW: 14 % (ref 11.5–14.5)
WBC: 13.2 10*3/uL — ABNORMAL HIGH (ref 3.6–11.0)

## 2017-08-03 LAB — RPR: RPR: NONREACTIVE

## 2017-08-03 MED ORDER — IBUPROFEN 800 MG PO TABS
800.0000 mg | ORAL_TABLET | Freq: Four times a day (QID) | ORAL | Status: DC
  Administered 2017-08-03 – 2017-08-05 (×9): 800 mg via ORAL
  Filled 2017-08-03 (×9): qty 1

## 2017-08-03 MED ORDER — NALBUPHINE HCL 10 MG/ML IJ SOLN
5.0000 mg | Freq: Once | INTRAMUSCULAR | Status: AC
  Administered 2017-08-03: 5 mg via INTRAVENOUS
  Filled 2017-08-03: qty 1

## 2017-08-03 NOTE — Plan of Care (Signed)
Patient reassessment completed. Patient denies discomfort or urge to void at this time. Nurse made aware. Will continue to monitor.

## 2017-08-03 NOTE — Progress Notes (Signed)
Postpartum Day # 1: Cesarean Delivery (primary)  Subjective: Patient reports tolerating PO, + flatus and no problems voiding.  Denies nausea/vomiting. Pain well controlled with pain meds.   Objective: Vital signs in last 24 hours: Temp:  [97.9 F (36.6 C)-99.5 F (37.5 C)] 98.9 F (37.2 C) (04/17 0512) Pulse Rate:  [64-184] 96 (04/17 0512) Resp:  [14-20] 18 (04/16 2248) BP: (92-132)/(50-93) 104/60 (04/17 0512) SpO2:  [96 %-100 %] 96 % (04/17 0512)  Physical Exam:  General: alert and no distress Lungs: clear to auscultation bilaterally Breasts: normal appearance, no masses or tenderness Heart: regular rate and rhythm, S1, S2 normal, no murmur, click, rub or gallop Abdomen: soft, non-tender; bowel sounds normal; no masses,  no organomegaly Pelvis: Lochia appropriate, Uterine Fundus firm, Incision: healing well, no significant drainage, no dehiscence, no significant erythema Extremities: DVT Evaluation: No evidence of DVT seen on physical exam. Negative Homan's sign. No cords or calf tenderness. No significant calf/ankle edema.  Recent Labs    08/02/17 0144  HGB 11.6*  HCT 33.3*    Assessment/Plan: Status post Cesarean section. Doing well postoperatively.  Breastfeeding, Lactation consult.  Circumcision after discharge Contraception undecided Advance diet Continue PO pain management Remove foley catheter Discontinue IVF when tolerating regular diet Postpartum CBC pending.  Likely d/c home in 1-2 days.   Hildred LaserAnika Martie Fulgham, MD [facility]

## 2017-08-03 NOTE — Anesthesia Postprocedure Evaluation (Signed)
Anesthesia Post Note  Patient: Casey Fuentes  Procedure(s) Performed: CESAREAN SECTION (N/A Abdomen)  Anesthesia Type: Epidural and General Level of consciousness: awake and alert and sedated Pain management: pain level controlled Vital Signs Assessment: post-procedure vital signs reviewed and stable Respiratory status: spontaneous breathing Cardiovascular status: stable Postop Assessment: no backache, patient able to bend at knees, no apparent nausea or vomiting and adequate PO intake Anesthetic complications: no     Last Vitals:  Vitals:   08/02/17 2248 08/03/17 0512  BP: 132/75 104/60  Pulse: 92 96  Resp: 18   Temp: 37.5 C 37.2 C  SpO2: 99% 96%    Last Pain:  Vitals:   08/03/17 0512  TempSrc: Oral  PainSc:                  Zachary GeorgeWeatherly,  Jaxin Fulfer F

## 2017-08-03 NOTE — Lactation Note (Signed)
This note was copied from a baby's chart. Lactation Consultation Note  Patient Name: Casey Lyn HollingsheadKaitlyn Winkels [id number]'s Date: 08/03/2017 Reason for consult: Follow-up assessment   Maternal Data    Feeding Feeding Type: Breast Fed Length of feed: 10 min  LATCH Score                   Interventions    Lactation Tools Discussed/Used     Consult Status   Spoke mostly with mother of baby's mom about the feedings. Mom says she doesn't have questions at this time and will reach out if assistance is needed. LC let family know about breastfeeding support after d/c.   [name] 08/03/2017, 9:53 PM

## 2017-08-03 NOTE — Progress Notes (Signed)
Pt decided to change to "No" in the directory.  Security notified and password entered.

## 2017-08-03 NOTE — Progress Notes (Addendum)
Per patient-08/03/17  Request for no visitors except her brother Milinda AntisCody Belford at this time.  All other visitors in the waiting area notified of this change in visitation and management was able to gather all visitors belongings and return them to the appropriate visitors at the waiting area. This included the FOB who does not have an armband and his visitors. Patient was offered the opportunity to become a "No" in the directory.  Pt verbalized that this did not have to happen at this point.  Encouraged patient to notify staff of any changes to her decision.

## 2017-08-04 LAB — VARICELLA ZOSTER ANTIBODY, IGG: Varicella IgG: 245 index (ref >165)

## 2017-08-04 NOTE — Plan of Care (Signed)
Vs stable; up ad lib; taking motrin and percocet for pain control; LC and RN assisted with a feed this shift; voiding and stooling; RN encouraged pt to stimulate baby at the breast to try to encourage baby to stay awake at breast

## 2017-08-04 NOTE — Progress Notes (Signed)
Patient ID: Casey Fuentes, female   DOB: 04-Jan-1996, 22 y.o.   MRN: [id number]    Progress Note - Cesarean Delivery  Casey Fuentes is a 22 y.o. G1P1001 now PP day 2 s/p C-Section, Low Transverse .   Subjective:  Patient reports no problems with eating, bowel movements, voiding, or their wound.  She is breast-feeding.  She is "nervous" about going home and says that she needs constant help with "everything" including breast-feeding.  Objective:  Vital signs in last 24 hours: Temp:  [98.3 F (36.8 C)-98.8 F (37.1 C)] 98.7 F (37.1 C) (04/18 0752) Pulse Rate:  [77-93] 91 (04/18 0752) Resp:  [18-20] 18 (04/18 0752) BP: (94-108)/(62-75) 108/64 (04/18 0752) SpO2:  [97 %-100 %] 100 % (04/18 0752)  Physical Exam:  General: alert and cooperative Lochia: appropriate Uterine Fundus: firm Incision: Dressing clean and dry DVT Evaluation: No evidence of DVT seen on physical exam.    Data Review Recent Labs    08/02/17 0144 08/03/17 0909  HGB 11.6* 10.4*  HCT 33.3* 30.4*    Assessment:  Active Problems:   Morbid obesity (HCC)   History of maternal Chlamydia infection, currently pregnant in first trimester   History of polyhydramnios     Overview: Resolved by 36-[redacted] weeks gestation   Status post Cesarean section. Doing well postoperatively.  No medical issues-possible social issues.    Plan:       Continue current care patient will spend the day in the hospital breast-feeding and getting more comfortable with care of her baby.  She will decide later this afternoon if she would like to go home today or stay another night.    Elonda Huskyavid J. Maxxwell Edgett, M.D. 08/04/2017 9:10 AM

## 2017-08-05 MED ORDER — DOCUSATE SODIUM 100 MG PO CAPS: 100.0000 mg | ORAL_CAPSULE | Freq: Two times a day (BID) | ORAL | 2 refills | Status: DC | PRN

## 2017-08-05 MED ORDER — IBUPROFEN 800 MG PO TABS: 800.0000 mg | ORAL_TABLET | Freq: Three times a day (TID) | ORAL | 1 refills | Status: DC | PRN

## 2017-08-05 MED ORDER — OXYCODONE-ACETAMINOPHEN 5-325 MG PO TABS: 1.0000 | ORAL_TABLET | Freq: Four times a day (QID) | ORAL | 0 refills | Status: DC | PRN

## 2017-08-05 NOTE — Progress Notes (Signed)
Patient discharged home with infant. Discharge instructions and prescriptions given and reviewed with patient. Patient verbalized understanding. Escorted out by auxillary.  

## 2017-08-05 NOTE — Progress Notes (Signed)
Postpartum Day # 2: Cesarean Delivery (primary)  Subjective: Patient reports tolerating PO, + flatus and no problems voiding. Pain well controlled with pain meds. Ambulating without difficulty.    Objective: Vitals:   08/04/17 0752 08/04/17 1627 08/05/17 0100 08/05/17 0805  BP: 108/64 115/69 90/61 108/64  Pulse: 91 (!) 102 86 88  Resp: 18 17 20 20   Temp: 98.7 F (37.1 C) 98.3 F (36.8 C) 97.9 F (36.6 C) 98 F (36.7 C)  TempSrc: Oral Oral Oral Oral  SpO2: 100% 100% 100% 100%  Weight:      Height:        Physical Exam:  General: alert and no distress Lungs: clear to auscultation bilaterally Breasts: normal appearance, no masses or tenderness Heart: regular rate and rhythm, S1, S2 normal, no murmur, click, rub or gallop Abdomen: soft, non-tender; bowel sounds normal; no masses,  no organomegaly Pelvis: Lochia appropriate, Uterine Fundus firm, Incision: healing well, no significant drainage, no dehiscence, no significant erythema Extremities: DVT Evaluation: No evidence of DVT seen on physical exam. Negative Homan's sign. No cords or calf tenderness. No significant calf/ankle edema.  Recent Labs    08/03/17 0909  HGB 10.4*  HCT 30.4*    Assessment/Plan: Status post Cesarean section. Doing well postoperatively.  Breastfeeding, doing well.  Circumcision desired Contraception undecided. Regular diet Continue PO pain management Remove foley catheter Discharge home today.  She will follow up in clinic in 1 week for post-operative check.   Hildred LaserAnika Jodine Muchmore, MD [facility]

## 2017-08-05 NOTE — Discharge Instructions (Signed)

## 2017-08-05 NOTE — Discharge Summary (Signed)
OB Discharge Summary     Patient Name: Casey Fuentes DOB: 03/11/1996 MRN: [id number]  Date of admission: 08/02/2017 Delivering MD: Hildred Laser   Date of discharge: 08/06/2017  Admitting diagnosis: Morbid obesity in pregnancy Intrauterine pregnancy: [redacted]w[redacted]d     Secondary diagnosis:  Active Problems:   Morbid obesity (HCC)   Post-dates oregnancy   History of polyhydramnios   Additional problems: History of chlamydia in pregnancy     Discharge diagnosis: Term Pregnancy Delivered and morbid obesity                                                                                                Post partum procedures:None  Augmentation: AROM, Pitocin, Cytotec and Foley Balloon  Complications: None  Hospital course:  Induction of Labor With Cesarean Section  22 y.o. yo G1P1001 at [redacted]w[redacted]d was admitted to the hospital 08/02/2017 for induction of labor. Patient had a labor course significant for non-reassuring fetal heart tracinb. The patient went for cesarean section due to Non-Reassuring FHR, and delivered a Viable infant,08/02/2017  Membrane Rupture Time/Date: 2:51 PM ,08/02/2017   Details of operation can be found in separate operative Note.  Patient had an uncomplicated postpartum course. She is ambulating, tolerating a regular diet, passing flatus, and urinating well.  Patient is discharged home in stable condition on 08/06/17.                                    Physical exam  Vitals:   08/04/17 0752 08/04/17 1627 08/05/17 0100 08/05/17 0805  BP: 108/64 115/69 90/61 108/64  Pulse: 91 (!) 102 86 88  Resp: 18 17 20 20   Temp: 98.7 F (37.1 C) 98.3 F (36.8 C) 97.9 F (36.6 C) 98 F (36.7 C)  TempSrc: Oral Oral Oral Oral  SpO2: 100% 100% 100% 100%  Weight:      Height:       General: alert and no distress Lochia: appropriate Uterine Fundus: firm Incision: Healing well with no significant drainage DVT Evaluation: No evidence of DVT seen on physical exam. Labs: Lab Results   Component Value Date   WBC 13.2 (H) 08/03/2017   HGB 10.4 (L) 08/03/2017   HCT 30.4 (L) 08/03/2017   MCV 87.8 08/03/2017   PLT 190 08/03/2017   CMP Latest Ref Rng & Units 12/30/2016  Glucose 65 - 99 mg/dL 89  BUN 6 - 20 mg/dL 9  Creatinine 2.13 - 0.86 mg/dL 5.78  Sodium 469 - 629 mmol/L 134(L)  Potassium 3.5 - 5.1 mmol/L 3.6  Chloride 101 - 111 mmol/L 106  CO2 22 - 32 mmol/L 22  Calcium 8.9 - 10.3 mg/dL 5.2(W)  Total Protein 6.5 - 8.1 g/dL 7.9  Total Bilirubin 0.3 - 1.2 mg/dL 0.5  Alkaline Phos 38 - 126 U/L 57  AST 15 - 41 U/L 17  ALT 14 - 54 U/L 13(L)    Discharge instruction: per After Visit Summary and "Baby and Me Booklet".  After visit meds:  Allergies as of 08/05/2017  Reactions   Amoxicillin       Medication List    TAKE these medications   docusate sodium 100 MG capsule Commonly known as:  COLACE Take 1 capsule (100 mg total) by mouth 2 (two) times daily as needed.   ibuprofen 800 MG tablet Commonly known as:  ADVIL,MOTRIN Take 1 tablet (800 mg total) by mouth every 8 (eight) hours as needed.   oxyCODONE-acetaminophen 5-325 MG tablet Commonly known as:  PERCOCET/ROXICET Take 1-2 tablets by mouth every 6 (six) hours as needed (pain scale 4-7).   prenatal multivitamin Tabs tablet Take 1 tablet by mouth daily at 12 noon.       Diet: routine diet  Activity: Advance as tolerated. Pelvic rest for 6 weeks.   Outpatient follow up:1 week Follow up Appt:No future appointments. Follow up Visit:No follow-ups on file.  Postpartum contraception: Undecided  Newborn Data: Live born female  Birth Weight: 7 lb 10.8 oz (3480 g) APGAR: 3, 7  Newborn Delivery   Birth date/time:  08/02/2017 19:40:00 Delivery type:  C-Section, Low Transverse Trial of labor:  No C-section categorization:  Primary     Baby Feeding: Breast Disposition:home with mother   08/06/2017 Hildred LaserAnika Sahib Pella, MD

## 2017-08-05 NOTE — Progress Notes (Signed)
Patient watched period of purple crying video.

## 2017-08-10 ENCOUNTER — Ambulatory Visit (INDEPENDENT_AMBULATORY_CARE_PROVIDER_SITE_OTHER): Payer: BLUE CROSS/BLUE SHIELD | Admitting: Obstetrics and Gynecology

## 2017-08-10 ENCOUNTER — Encounter: Payer: Self-pay | Admitting: Obstetrics and Gynecology

## 2017-08-10 VITALS — BP 111/78 | HR 84 | Ht 61.0 in | Wt 256.6 lb

## 2017-08-10 DIAGNOSIS — O9212 Cracked nipple associated with the puerperium: Secondary | ICD-10-CM

## 2017-08-10 DIAGNOSIS — Z4889 Encounter for other specified surgical aftercare: Secondary | ICD-10-CM

## 2017-08-10 DIAGNOSIS — Z98891 History of uterine scar from previous surgery: Secondary | ICD-10-CM

## 2017-08-10 NOTE — Progress Notes (Signed)
    OBSTETRICS/GYNECOLOGY POST-OPERATIVE CLINIC VISIT  Subjective:     Casey Fuentes is a 22 y.o. female who presents to the clinic 1 weeks status post primary low-transverse C-section for non-reassuring fetal tracing in labor. Eating a regular diet without difficulty. Bowel movements are normal. Pain is controlled with current analgesics. Medications being used: prescription NSAID's including ibuprofen (Motrin) and narcotic analgesics including oxycodone/acetaminophen (Percocet, Tylox).  The following portions of the patient's history were reviewed and updated as appropriate: allergies, current medications, past family history, past medical history, past social history, past surgical history and problem list.  Review of Systems A comprehensive review of systems was negative except for: Integument/breast: positive for breast tenderness and nipple pain (sore and cracked nipples x 3 days). Notes infant is cluster feeding. Is using Lanolin cream without much relief.    Objective:    BP 111/78   Pulse 84   Ht 5\' 1"  (1.549 m)   Wt 256 lb 9.6 oz (116.4 kg)   LMP 10/17/2016 (Approximate)   Breastfeeding? Yes   BMI 48.48 kg/m  General:  alert and no distress  Abdomen: soft, bowel sounds active, non-tender  Breasts:   bilateral breasts with cracked nipples, no rashes, erythema, or masses. Non-engorged.   Incision:   healing well, no drainage, no erythema, no hernia, no seroma, no swelling, no dehiscence, incision well approximated     Assessment:    Doing well postoperatively. S/p Cesarean section  Cracked nipples  Plan:   1. Continue any current medications.  Advised that once narcotic pain medication was complete, she could continue to alternate with Ibuprofen and ES Tylenol for pain control. Can also use OTC lidocaine patches if needed.  2. Wound care discussed. 3. Operative findings again reviewed. 4. Activity restrictions: no bending, stooping, or squatting, no lifting more  than 10 pounds and pelvic rest 5. Anticipated return to work: not applicable. 6. Discussed use of nipple shields, breast pumping and feeding from bottle, ice packs to area, and continued use of Lanolin.  7. Follow up: 5 weeks for postpartum visit.     Casey Fuentes, Casey Beckner, MD [facility]

## 2017-08-10 NOTE — Progress Notes (Signed)
Pts nipples on both breast have been sore and cracked for 3 days. Pt is having pain from her incisions.

## 2017-08-13 ENCOUNTER — Encounter: Payer: Self-pay | Admitting: Obstetrics and Gynecology

## 2017-08-26 DIAGNOSIS — Z3482 Encounter for supervision of other normal pregnancy, second trimester: Secondary | ICD-10-CM | POA: Diagnosis not present

## 2017-08-26 DIAGNOSIS — O2200 Varicose veins of lower extremity in pregnancy, unspecified trimester: Secondary | ICD-10-CM | POA: Diagnosis not present

## 2017-09-14 ENCOUNTER — Encounter: Payer: BLUE CROSS/BLUE SHIELD | Admitting: Obstetrics and Gynecology

## 2017-09-15 ENCOUNTER — Encounter: Payer: Self-pay | Admitting: Obstetrics and Gynecology

## 2017-09-15 ENCOUNTER — Ambulatory Visit (INDEPENDENT_AMBULATORY_CARE_PROVIDER_SITE_OTHER): Payer: BLUE CROSS/BLUE SHIELD | Admitting: Obstetrics and Gynecology

## 2017-09-15 DIAGNOSIS — Z3009 Encounter for other general counseling and advice on contraception: Secondary | ICD-10-CM

## 2017-09-15 NOTE — Progress Notes (Signed)
Pt is present today for postpartum care. Pt stated that she is well and the baby is doing good. Pt stated that she is not breastfeeding to her not being able to produce enough milk. Pt stated that c-section incision is doing well. EDS=0

## 2017-09-15 NOTE — Progress Notes (Signed)
   OBSTETRICS POSTPARTUM CLINIC PROGRESS NOTE  Subjective:     Casey Fuentes is a 22 y.o. G83P1001 female who presents for a postpartum visit. She is 6 weeks postpartum following a low cervical transverse Cesarean section. I have fully reviewed the prenatal and intrapartum course. The delivery was at 40.1 gestational weeks, induction due to morbid obesity.  Anesthesia: epidural. Postpartum course has been well. Baby's course has been well. Baby is feeding by bottle Rush Barer. Bleeding: patient has resumed menses, with patient's last menstrual period was 09/13/2017. Bowel function is normal. Bladder function is normal. Patient is not sexually active. Contraception method desired is IUD (unsure which kind). Postpartum depression screening: negative. EPDS score is 0.  The following portions of the patient's history were reviewed and updated as appropriate: allergies, current medications, past family history, past medical history, past social history, past surgical history and problem list.  Review of Systems Pertinent items noted in HPI and remainder of comprehensive ROS otherwise negative.   Objective:    BP 103/72   Pulse 73   Ht  (1.549 m)   Wt 241 lb 11.2 oz (109.6 kg)   LMP 09/13/2017   Breastfeeding? No   BMI 45.67 kg/m   General:  alert and no distress   Breasts:  inspection negative, no nipple discharge or bleeding, no masses or nodularity palpable  Lungs: clear to auscultation bilaterally  Heart:  regular rate and rhythm, S1, S2 normal, no murmur, click, rub or gallop  Abdomen: soft, non-tender; bowel sounds normal; no masses,  no organomegaly.  Well healed Pfannenstiel incision   Vulva:  normal  Vagina: normal vagina, no discharge, exudate, lesion, or erythema  Cervix:  no cervical motion tenderness and no lesions  Corpus: normal size, contour, position, consistency, mobility, non-tender  Adnexa:  normal adnexa and no mass, fullness, tenderness  Rectal Exam: Not  performed.         Labs:  Lab Results  Component Value Date   HGB 10.4 (L) 08/03/2017    Assessment:   Routine postpartum exam, s/p C-section   Mild postpartum anemia Morbid obesity    Plan:    1. Contraception: IUD.  Patient unsure of desires of which type.  Discussed risks/benefits of hormonal vs non-hormonal IUD.  Patient now has decided on Mirena. Will return in 1 week for insertion (per pt request).  2. Mild anemia, asymptomatic. Patient has been taking PNV with iron.  Will return to normal levels.  3. Follow up in: 1 week for IUD placement.    Hildred Laser, MD [facility]

## 2017-09-21 ENCOUNTER — Encounter: Payer: Self-pay | Admitting: Obstetrics and Gynecology

## 2017-09-23 ENCOUNTER — Encounter: Payer: Self-pay | Admitting: Obstetrics and Gynecology

## 2017-09-23 ENCOUNTER — Ambulatory Visit (INDEPENDENT_AMBULATORY_CARE_PROVIDER_SITE_OTHER): Payer: BLUE CROSS/BLUE SHIELD | Admitting: Obstetrics and Gynecology

## 2017-09-23 VITALS — BP 106/69 | HR 79 | Ht 61.0 in | Wt 241.1 lb

## 2017-09-23 DIAGNOSIS — Z30430 Encounter for insertion of intrauterine contraceptive device: Secondary | ICD-10-CM

## 2017-09-23 LAB — POCT URINE PREGNANCY: Preg Test, Ur: NEGATIVE

## 2017-09-23 NOTE — Progress Notes (Signed)
Pt is present today for IUD insertion of Mirena. Pt stated that she is doing well.

## 2017-09-23 NOTE — Progress Notes (Signed)
     GYNECOLOGY OFFICE PROCEDURE NOTE  Edsel PetrinKaitlyn M Fuentes is a 22 y.o. G1P1001 here for Mirena IUD insertion. No GYN concerns.  Last pap smear was on 01/20/2017 and was normal. Patient's last menstrual period was 09/13/2017.  IUD Insertion Procedure Note Patient identified, informed consent performed, consent signed.   Discussed risks of irregular bleeding, cramping, infection, malpositioning or misplacement of the IUD outside the uterus which may require further procedure such as laparoscopy. Time out was performed.  Urine pregnancy test negative.  Speculum placed in the vagina.  Cervix visualized.  Cleaned with Betadine x 2.  Grasped anteriorly with a single tooth tenaculum.  Uterus sounded to 7.5 cm.  Mirena IUD placed per manufacturer's recommendations.  Strings trimmed to 3 cm. Tenaculum was removed, good hemostasis noted.  Patient tolerated procedure well.   Patient was given post-procedure instructions.  She was advised to have backup contraception for one week.  Patient was also asked to check IUD strings periodically and follow up in 4 weeks for IUD check.    Lot: [id number] Exp: 01/2020

## 2017-09-23 NOTE — Patient Instructions (Signed)

## 2017-09-24 IMAGING — CR DG CHEST 2V
2 series · 2 of 2 positions shown · non-contrast
Comparison: None.

CLINICAL DATA: Chest pain

EXAM:
CHEST  2 VIEW

[chest pa]
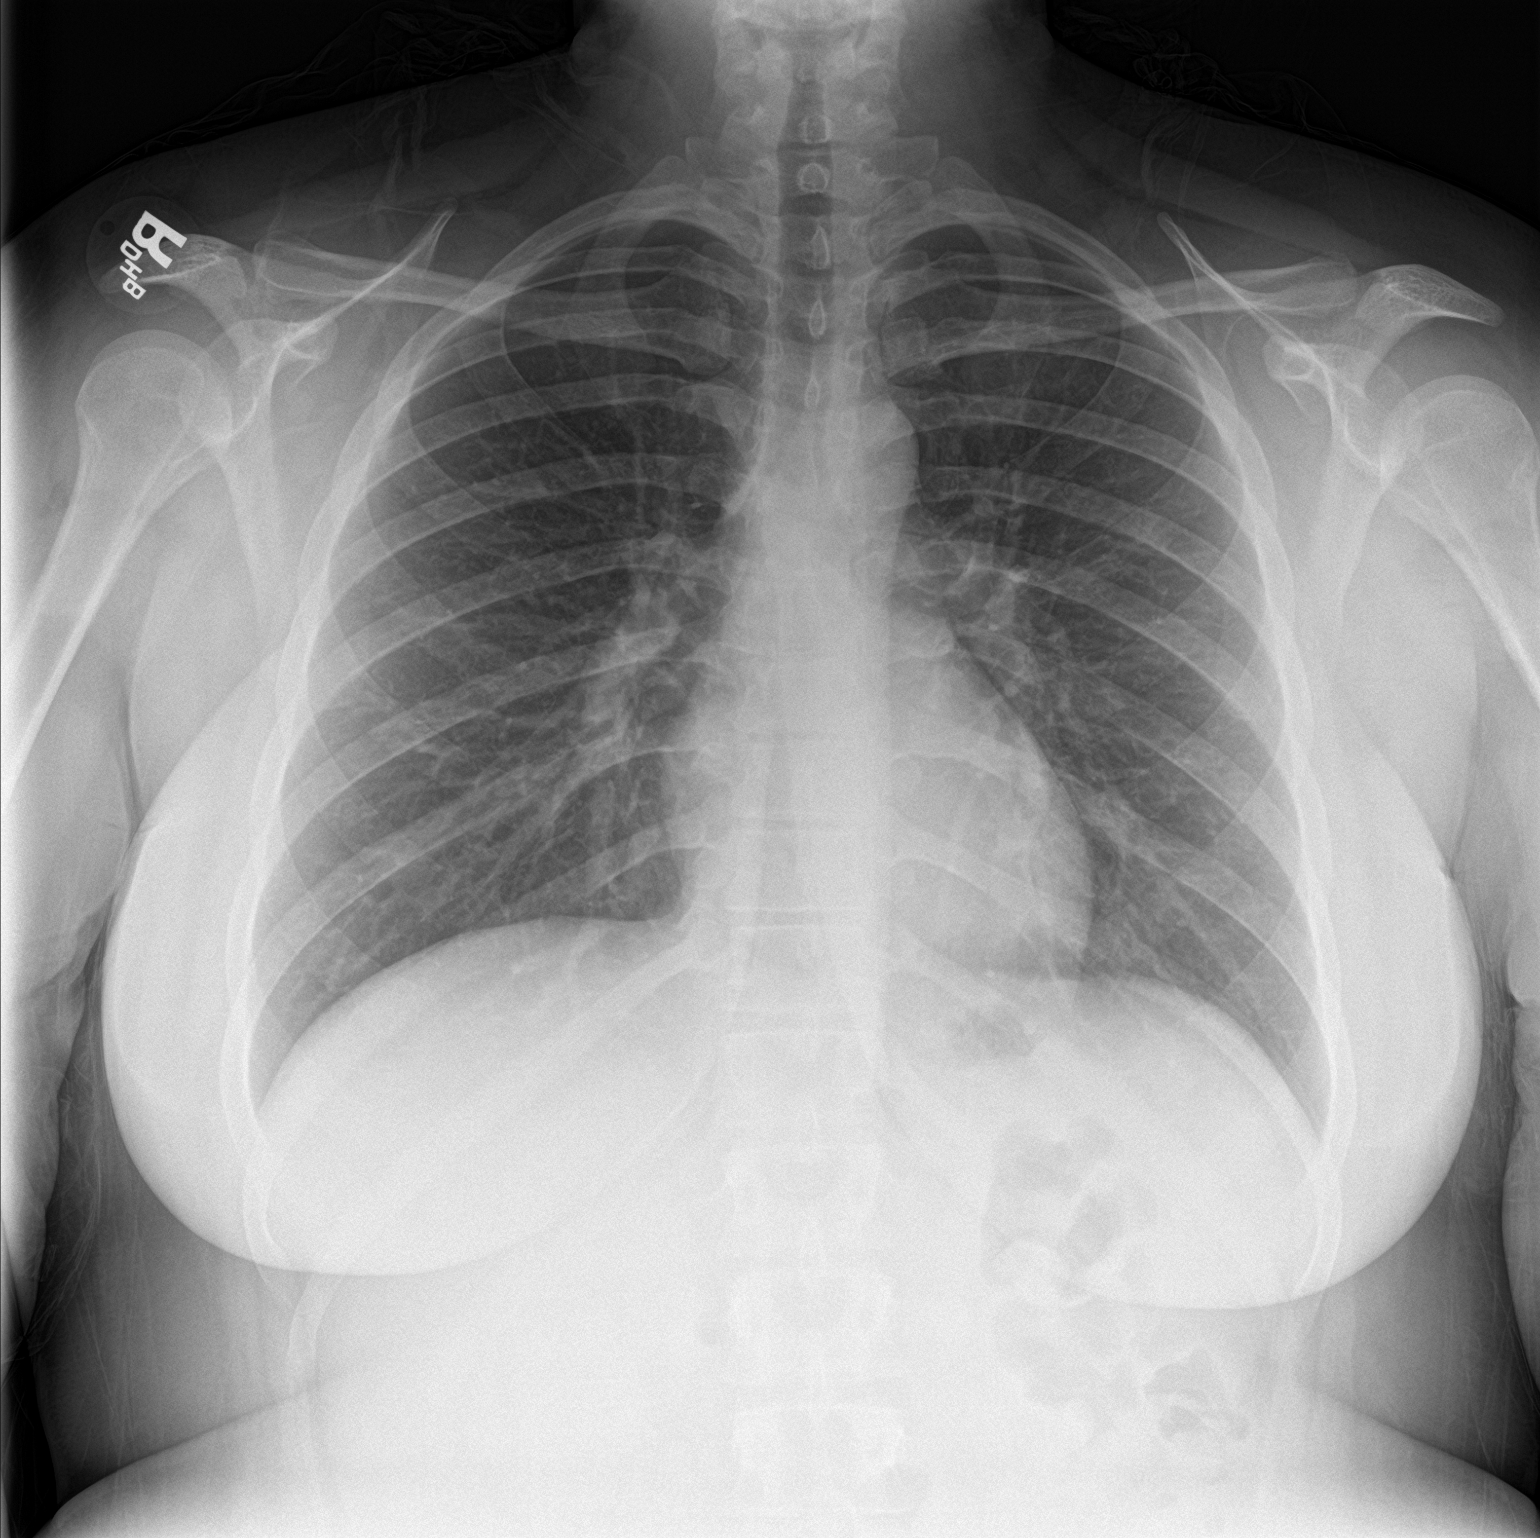

[chest lat]
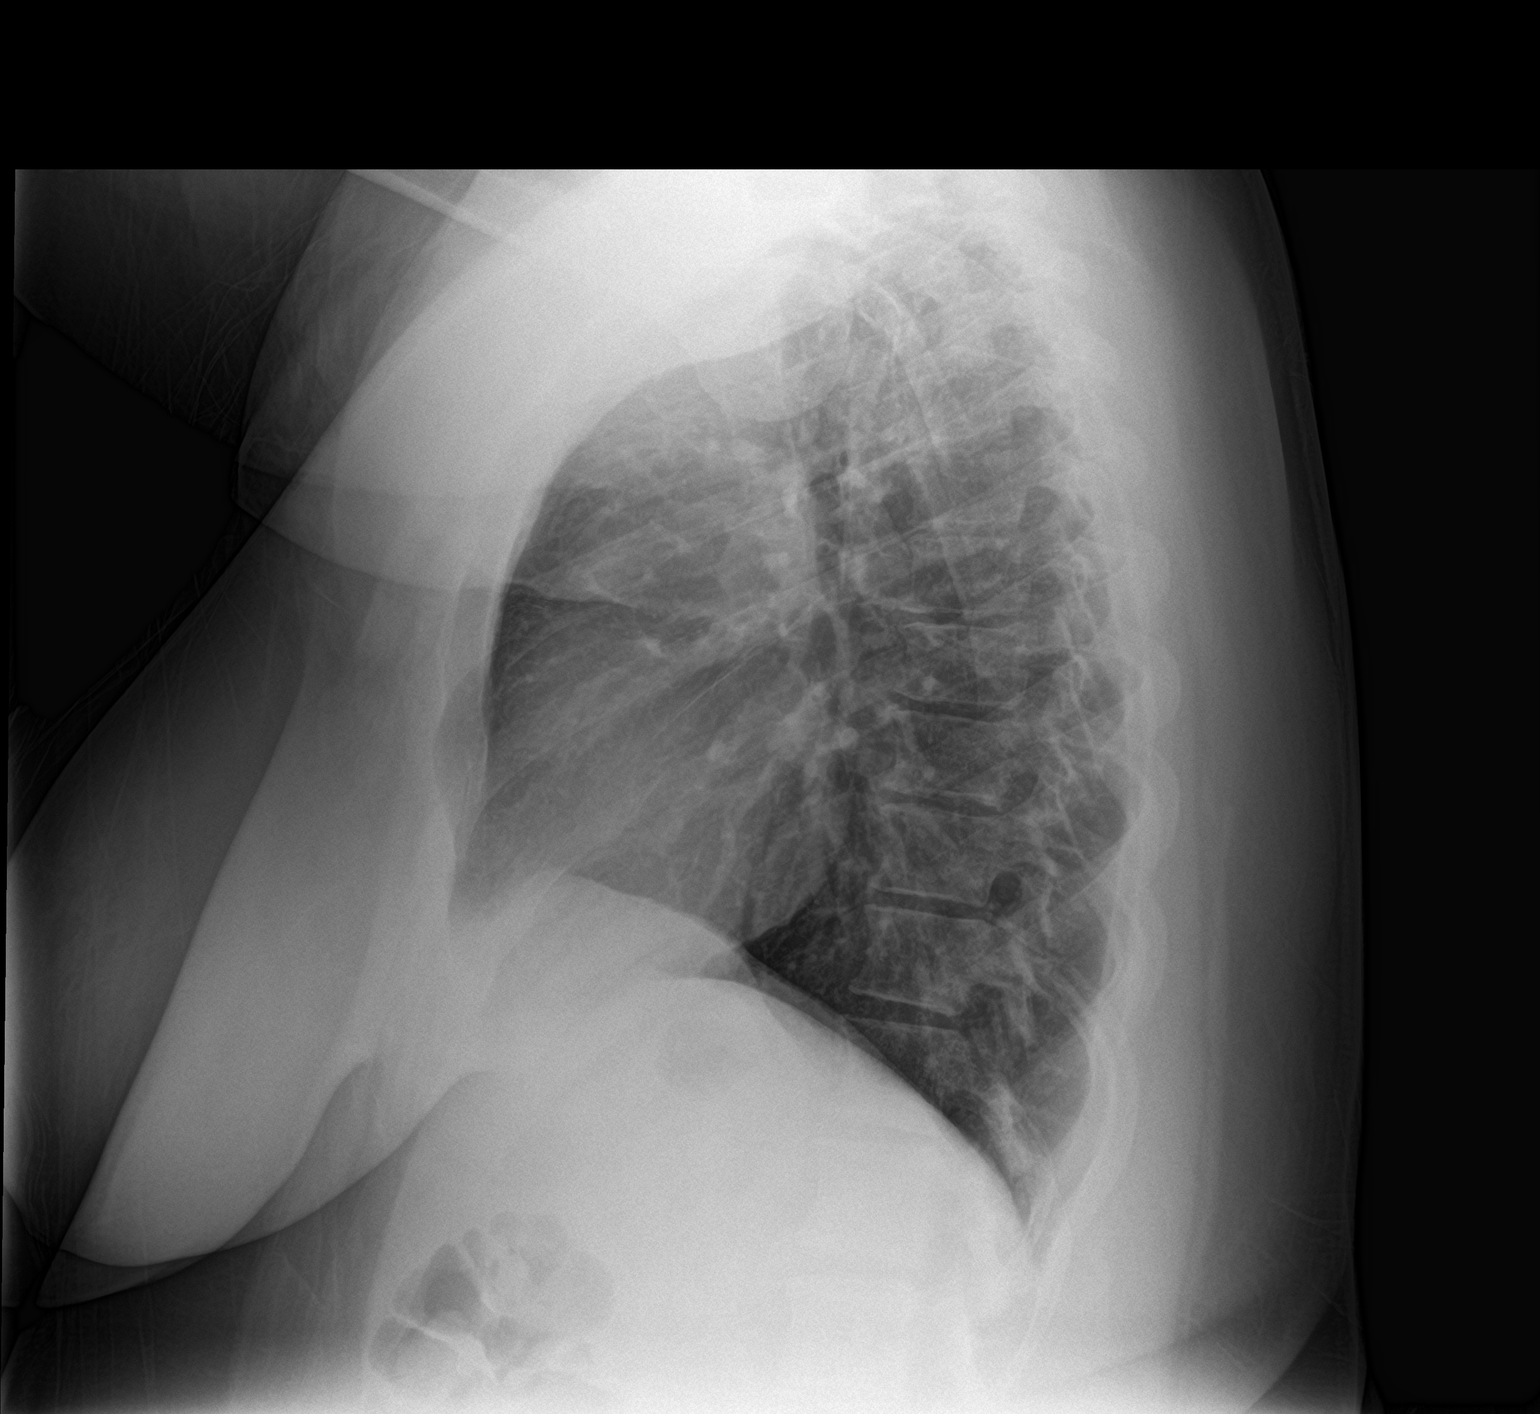

[2 of 2 positions shown; findings below may reference images not displayed]

FINDINGS: Normal heart size and mediastinal contours. No acute infiltrate or
edema. No effusion or pneumothorax. No acute osseous findings.
IMPRESSION: Negative chest.

## 2017-10-21 ENCOUNTER — Other Ambulatory Visit: Payer: BLUE CROSS/BLUE SHIELD

## 2017-10-21 ENCOUNTER — Encounter: Payer: Self-pay | Admitting: Obstetrics and Gynecology

## 2017-10-21 ENCOUNTER — Ambulatory Visit (INDEPENDENT_AMBULATORY_CARE_PROVIDER_SITE_OTHER): Payer: BLUE CROSS/BLUE SHIELD | Admitting: Obstetrics and Gynecology

## 2017-10-21 VITALS — BP 117/71 | HR 82 | Ht 61.0 in | Wt 235.5 lb

## 2017-10-21 DIAGNOSIS — F419 Anxiety disorder, unspecified: Secondary | ICD-10-CM | POA: Diagnosis not present

## 2017-10-21 DIAGNOSIS — F53 Postpartum depression: Secondary | ICD-10-CM

## 2017-10-21 DIAGNOSIS — Z7289 Other problems related to lifestyle: Secondary | ICD-10-CM

## 2017-10-21 DIAGNOSIS — O99345 Other mental disorders complicating the puerperium: Secondary | ICD-10-CM | POA: Diagnosis not present

## 2017-10-21 MED ORDER — SERTRALINE HCL 50 MG PO TABS: 50.0000 mg | ORAL_TABLET | Freq: Every day | ORAL | 3 refills | Status: DC

## 2017-10-21 NOTE — Progress Notes (Signed)
Pt stated she feels overwhelmed and tired and don't feel like doing anything. EPDS=22.

## 2017-10-21 NOTE — Progress Notes (Signed)
    GYNECOLOGY PROGRESS NOTE  Subjective:    Patient ID: Casey Fuentes, female    DOB: Jun 10, 1995, 22 y.o.   MRN: [id number]  HPI  Patient is a 22 y.o. 501P1001 female who is 2 months postpartum who presents for evaluation for postpartum depression. She is accompanied by her mother today (who provided most of the information as patient notes she felt to ashamed to discuss her symptoms). Her symptoms include not feeling the desires to care or tend to her baby, feels like there is no bond between her and the infant at times. She also has been lying in bed most days, barely participating in ADL's. The baby has had to stay with the FOB for the past several days as the patient did not feel up to caring for it.  She has also been feeling lots of anxiety lately (mostly about not having the motivation to care for her child).   Of note, patient does have a history of depression (per patient's mother), however was never seen or professionally evaluated for it. She also used to engage in self-cutting (wrists) around age [age], however states that she has not gotten to that point at this time.  She does report having occasional suicidal ideation, and has even developed a plan, however has not acted on it.  Denies HI. EPDS score is 22.   The following portions of the patient's history were reviewed and updated as appropriate: allergies, current medications, past family history, past medical history, past social history, past surgical history and problem list.  Review of Systems Pertinent items noted in HPI and remainder of comprehensive ROS otherwise negative.   Objective:   Blood pressure 117/71, pulse 82, height 5\' 1"  (1.549 m), weight 235 lb 8 oz (106.8 kg), last menstrual period 10/15/2016, not currently breastfeeding. General appearance: alert and no distress Neurologic: Grossly normal  Psychiatric: depressed mood, flat affect, normal speech and though content.    Assessment:   Postpartum  depression Anxiety  Plan:   - Patient with postpartum depression and anxiety.  Also with likely past history of depression and self-cutting. Based on symptoms, prior history, and depression screening, patient is a candidate for dual modality therapy with medications and counseling. Patient hesitant about counseling as she is very introverted, however requested that she try at least one session before ruling it out as an option. Given handouts on several psychologists in the area and referral placed.  Will also initiate on Zoloft 50 mg. RTC in 2 weeks to reassess symptoms. Discussed SI precautions, advised on suicide hotline.     A total of 15 minutes were spent face-to-face with the patient during this encounter and over half of that time dealt with counseling and coordination of care.   Casey Fuentes, Casey Goldwater, MD [facility]

## 2017-10-21 NOTE — Patient Instructions (Signed)

## 2017-10-22 ENCOUNTER — Encounter: Payer: Self-pay | Admitting: Obstetrics and Gynecology

## 2017-10-22 DIAGNOSIS — F411 Generalized anxiety disorder: Secondary | ICD-10-CM | POA: Insufficient documentation

## 2017-10-22 DIAGNOSIS — F419 Anxiety disorder, unspecified: Secondary | ICD-10-CM

## 2017-10-22 DIAGNOSIS — Z7289 Other problems related to lifestyle: Secondary | ICD-10-CM

## 2017-10-22 DIAGNOSIS — O99345 Other mental disorders complicating the puerperium: Secondary | ICD-10-CM

## 2017-10-22 DIAGNOSIS — F53 Postpartum depression: Secondary | ICD-10-CM

## 2017-10-22 HISTORY — DX: Other problems related to lifestyle: Z72.89

## 2017-10-22 HISTORY — DX: Postpartum depression: F53.0

## 2017-10-22 HISTORY — DX: Other mental disorders complicating the puerperium: O99.345

## 2017-10-22 HISTORY — DX: Anxiety disorder, unspecified: F41.9

## 2017-10-25 ENCOUNTER — Ambulatory Visit (INDEPENDENT_AMBULATORY_CARE_PROVIDER_SITE_OTHER): Payer: BLUE CROSS/BLUE SHIELD | Admitting: Obstetrics and Gynecology

## 2017-10-25 ENCOUNTER — Encounter: Payer: Self-pay | Admitting: Obstetrics and Gynecology

## 2017-10-25 ENCOUNTER — Encounter (INDEPENDENT_AMBULATORY_CARE_PROVIDER_SITE_OTHER): Payer: Self-pay

## 2017-10-25 VITALS — BP 104/72 | HR 73 | Ht 61.0 in | Wt 233.4 lb

## 2017-10-25 DIAGNOSIS — Z30431 Encounter for routine checking of intrauterine contraceptive device: Secondary | ICD-10-CM

## 2017-10-25 NOTE — Progress Notes (Signed)
   GYNECOLOGY OFFICE PROGRESS NOTE  History:  22 y.o. G1P1001 here today for today for IUD string check; Mirena IUD was placed in June 2019. No complaints about the IUD, no concerning side effects.  The following portions of the patient's history were reviewed and updated as appropriate: allergies, current medications, past family history, past medical history, past social history, past surgical history and problem list. Last pap smear on 01/2017 was normal, negative HRHPV.  Review of Systems:  Pertinent items are noted in HPI.   Objective:  Physical Exam Blood pressure 104/72, pulse 73, height 5\' 1"  (1.549 m), weight 233 lb 6.4 oz (105.9 kg), not currently breastfeeding. CONSTITUTIONAL: Well-developed, well-nourished female in no acute distress.  ABDOMEN: Soft, no distention noted.   PELVIC: Normal appearing external genitalia; normal appearing vaginal mucosa and cervix.  IUD strings visualized, about 3 cm in length outside cervix.  EXTREMITIES: extremities normal, atraumatic, no cyanosis or edema NEUROLOGIC: Grossly normal   Assessment & Plan:  Normal IUD check. Patient to keep IUD in place for five years; can come in for removal if she desires pregnancy within the next five years. Routine preventative health maintenance measures emphasized.   Hildred Laserherry, Saahas Hidrogo, MD [facility]

## 2017-10-25 NOTE — Progress Notes (Signed)
Pt stated that she is doing well no complaints.  

## 2017-11-04 ENCOUNTER — Encounter: Payer: Self-pay | Admitting: Certified Nurse Midwife

## 2017-11-04 ENCOUNTER — Ambulatory Visit (INDEPENDENT_AMBULATORY_CARE_PROVIDER_SITE_OTHER): Payer: BLUE CROSS/BLUE SHIELD | Admitting: Obstetrics and Gynecology

## 2017-11-04 ENCOUNTER — Encounter: Payer: Self-pay | Admitting: Obstetrics and Gynecology

## 2017-11-04 VITALS — BP 109/71 | HR 94 | Ht 61.0 in | Wt 235.2 lb

## 2017-11-04 DIAGNOSIS — O99345 Other mental disorders complicating the puerperium: Secondary | ICD-10-CM | POA: Diagnosis not present

## 2017-11-04 DIAGNOSIS — F53 Postpartum depression: Secondary | ICD-10-CM

## 2017-11-04 NOTE — Progress Notes (Signed)
    GYNECOLOGY PROGRESS NOTE  Subjective:    Patient ID: Casey Fuentes, female    DOB: [birth year], 22 y.o.   MRN: [id number]  HPI  Patient is a 22 y.o. 441P1001 female who presents for f/u of postpartum depression.  She was placed on Zoloft last visit (approximately 2-3 weeks ago).  Notes that she is feeling much better overall. Denies any side effects from the medication.  EDPS score = 9 (down from 25 several weeks ago). She has not yet begun counseling, missed appointment yesterday but did reschedule appointment.   The following portions of the patient's history were reviewed and updated as appropriate: allergies, current medications, past family history, past medical history, past social history, past surgical history and problem list.  Review of Systems Pertinent items noted in HPI and remainder of comprehensive ROS otherwise negative.   Objective:   Blood pressure 109/71, pulse 94, height 5\' 1"  (1.549 m), weight 235 lb 3.2 oz (106.7 kg), not currently breastfeeding. General appearance: alert and no distress Psychologic: normal speech, normal though content.  Normal appearing mood.    Assessment:   Postpartum depression  Plan:   Continue Zoloft.  Advised to keep appointment with counseling.  Discussed length of use for Zoloft with postpartum depression, minimum 6 months, can continue use for as long as needed.  Will f/u in 4 months for annual exam.    Hildred Laserherry, Emon Miggins, MD [facility]

## 2017-11-04 NOTE — Progress Notes (Signed)
Pt stated that Zoloft is helping with the depression. EPDS=9

## 2017-11-24 DIAGNOSIS — B001 Herpesviral vesicular dermatitis: Secondary | ICD-10-CM | POA: Diagnosis not present

## 2018-02-24 DIAGNOSIS — R6889 Other general symptoms and signs: Secondary | ICD-10-CM | POA: Diagnosis not present

## 2018-02-24 DIAGNOSIS — J029 Acute pharyngitis, unspecified: Secondary | ICD-10-CM | POA: Diagnosis not present

## 2018-03-07 ENCOUNTER — Encounter: Payer: BLUE CROSS/BLUE SHIELD | Admitting: Certified Nurse Midwife

## 2018-04-01 DIAGNOSIS — J069 Acute upper respiratory infection, unspecified: Secondary | ICD-10-CM | POA: Diagnosis not present

## 2018-05-22 DIAGNOSIS — H5212 Myopia, left eye: Secondary | ICD-10-CM | POA: Diagnosis not present

## 2018-05-22 DIAGNOSIS — H52223 Regular astigmatism, bilateral: Secondary | ICD-10-CM | POA: Diagnosis not present

## 2018-05-31 ENCOUNTER — Ambulatory Visit: Payer: BLUE CROSS/BLUE SHIELD | Admitting: Internal Medicine

## 2018-05-31 DIAGNOSIS — Z0289 Encounter for other administrative examinations: Secondary | ICD-10-CM

## 2018-10-23 ENCOUNTER — Other Ambulatory Visit: Payer: Self-pay

## 2018-10-23 ENCOUNTER — Encounter: Payer: Self-pay | Admitting: Emergency Medicine

## 2018-10-23 DIAGNOSIS — N390 Urinary tract infection, site not specified: Secondary | ICD-10-CM | POA: Insufficient documentation

## 2018-10-23 DIAGNOSIS — R1030 Lower abdominal pain, unspecified: Secondary | ICD-10-CM | POA: Diagnosis not present

## 2018-10-23 LAB — CBC
HCT: 43.7 % (ref 36.0–46.0)
Hemoglobin: 14.7 g/dL (ref 12.0–15.0)
MCH: 29.3 pg (ref 26.0–34.0)
MCHC: 33.6 g/dL (ref 30.0–36.0)
MCV: 87.2 fL (ref 80.0–100.0)
Platelets: 296 10*3/uL (ref 150–400)
RBC: 5.01 MIL/uL (ref 3.87–5.11)
RDW: 13.6 % (ref 11.5–15.5)
WBC: 13.5 10*3/uL — ABNORMAL HIGH (ref 4.0–10.5)
nRBC: 0 % (ref 0.0–0.2)

## 2018-10-23 LAB — POCT PREGNANCY, URINE: Preg Test, Ur: NEGATIVE

## 2018-10-23 LAB — COMPREHENSIVE METABOLIC PANEL
ALT: 11 U/L (ref 0–44)
AST: 13 U/L — ABNORMAL LOW (ref 15–41)
Albumin: 4.1 g/dL (ref 3.5–5.0)
Alkaline Phosphatase: 74 U/L (ref 38–126)
Anion gap: 8 (ref 5–15)
BUN: 11 mg/dL (ref 6–20)
CO2: 22 mmol/L (ref 22–32)
Calcium: 8.9 mg/dL (ref 8.9–10.3)
Chloride: 108 mmol/L (ref 98–111)
Creatinine, Ser: 0.7 mg/dL (ref 0.44–1.00)
GFR calc Af Amer: >60 mL/min (ref >60)
GFR calc non Af Amer: >60 mL/min (ref >60)
Glucose, Bld: 86 mg/dL (ref 70–99)
Potassium: 3.6 mmol/L (ref 3.5–5.1)
Sodium: 138 mmol/L (ref 135–145)
Total Bilirubin: 0.3 mg/dL (ref 0.3–1.2)
Total Protein: 7.9 g/dL (ref 6.5–8.1)

## 2018-10-23 LAB — URINALYSIS, COMPLETE (UACMP) WITH MICROSCOPIC
Bilirubin Urine: NEGATIVE
Glucose, UA: NEGATIVE mg/dL
Hgb urine dipstick: NEGATIVE
Ketones, ur: NEGATIVE mg/dL
Nitrite: NEGATIVE
Protein, ur: NEGATIVE mg/dL
Specific Gravity, Urine: 1.02 (ref 1.005–1.030)
pH: 6 (ref 5.0–8.0)

## 2018-10-23 NOTE — ED Triage Notes (Signed)
Pt presents to ED with generalized abd pressure that started this morning. Normal bowel movement today. Denies n/v/d. Denies urinary symptoms. Painful with palpation.

## 2018-10-24 ENCOUNTER — Emergency Department
Admission: EM | Admit: 2018-10-24 | Discharge: 2018-10-24 | Disposition: A | Discharge status: Home / Self Care | Payer: BC Managed Care – PPO | Attending: Emergency Medicine | Admitting: Emergency Medicine

## 2018-10-24 DIAGNOSIS — N390 Urinary tract infection, site not specified: Secondary | ICD-10-CM

## 2018-10-24 DIAGNOSIS — R1030 Lower abdominal pain, unspecified: Secondary | ICD-10-CM

## 2018-10-24 MED ORDER — DICYCLOMINE HCL 20 MG PO TABS: 20.0000 mg | ORAL_TABLET | Freq: Three times a day (TID) | ORAL | 0 refills | Status: DC | PRN

## 2018-10-24 MED ORDER — DICYCLOMINE HCL 10 MG PO CAPS
20.0000 mg | ORAL_CAPSULE | Freq: Once | ORAL | Status: AC
  Administered 2018-10-24: 01:00:00 20 mg via ORAL
  Filled 2018-10-24: qty 2

## 2018-10-24 MED ORDER — NITROFURANTOIN MONOHYD MACRO 100 MG PO CAPS: 100.0000 mg | ORAL_CAPSULE | Freq: Two times a day (BID) | ORAL | 0 refills | Status: AC

## 2018-10-24 NOTE — ED Provider Notes (Signed)
[facility] Emergency Department Provider Note   ____________________________________________   I have reviewed the triage vital signs and the nursing notes.   HISTORY  Chief Complaint Abdominal Pain   History limited by: Not Limited   HPI Casey Fuentes is a 23 y.o. female who presents to the emergency department today because of concern for abdominal pain. The patient states that the pain started earlier today. Located in the lower abdomen. The patient has not noticed any change in defecation or urination. She has not had any vomiting. Denies any fevers. Denies similar symptoms in the past.   Records reviewed. Per medical record review patient has a history of cesarean section.  Past Medical History:  Diagnosis Date  . Anxiety 10/22/2017  . Chlamydia infection during pregnancy 02/03/2017  . Deliberate self-cutting 10/22/2017   Age [age]  . Morbid obesity (HCC) 06/01/2017  . Postpartum depression 10/22/2017    Patient Active Problem List   Diagnosis Date Noted  . Postpartum depression 10/22/2017  . Anxiety 10/22/2017  . Deliberate self-cutting 10/22/2017  . Morbid obesity (HCC) 06/01/2017    Past Surgical History:  Procedure Laterality Date  . CESAREAN SECTION N/A 08/02/2017   Procedure: CESAREAN SECTION;  Surgeon: Hildred Laserherry, Anika, MD;  Location: [facility] ORS;  Service: Obstetrics;  Laterality: N/A;  . TONSILLECTOMY      Prior to Admission medications   Medication Sig Start Date End Date Taking? Authorizing Provider  sertraline (ZOLOFT) 50 MG tablet Take 1 tablet (50 mg total) by mouth daily. 10/21/17   Hildred Laserherry, Anika, MD    Allergies Amoxicillin  Family History  Problem Relation Age of Onset  . Diabetes Mother   . Thyroid disease Mother   . Cirrhosis Mother     Social History Social History   Tobacco Use  . Smoking status: Never Smoker  . Smokeless tobacco: Never Used  Substance Use Topics  . Alcohol use: No  . Drug use: No    Review  of Systems Constitutional: No fever/chills Eyes: No visual changes. ENT: No sore throat. Cardiovascular: Denies chest pain. Respiratory: Denies shortness of breath. Gastrointestinal: Positive for lower abdominal pain.   Genitourinary: Negative for dysuria. Musculoskeletal: Negative for back pain. Skin: Negative for rash. Neurological: Negative for headaches, focal weakness or numbness.  ____________________________________________   PHYSICAL EXAM:  VITAL SIGNS: ED Triage Vitals  Enc Vitals Group     BP 10/23/18 1910 121/80     Pulse Rate 10/23/18 1910 70     Resp 10/23/18 1910 16     Temp 10/23/18 1910 99.1 F (37.3 C)     Temp Source 10/23/18 1910 Oral     SpO2 10/23/18 1910 97 %     Weight 10/23/18 1911 220 lb (99.8 kg)     Height 10/23/18 1911 5\' 1"  (1.549 m)     Head Circumference --      Peak Flow --      Pain Score 10/23/18 1918 8   Constitutional: Alert and oriented.  Eyes: Conjunctivae are normal.  ENT      Head: Normocephalic and atraumatic.      Nose: No congestion/rhinnorhea.      Mouth/Throat: Mucous membranes are moist.      Neck: No stridor. Hematological/Lymphatic/Immunilogical: No cervical lymphadenopathy. Cardiovascular: Normal rate, regular rhythm.  No murmurs, rubs, or gallops.  Respiratory: Normal respiratory effort without tachypnea nor retractions. Breath sounds are clear and equal bilaterally. No wheezes/rales/rhonchi. Gastrointestinal: Soft and tender to palpation in the lower abdomen, left >  right. Genitourinary: Deferred Musculoskeletal: Normal range of motion in all extremities. No lower extremity edema. Neurologic:  Normal speech and language. No gross focal neurologic deficits are appreciated.  Skin:  Skin is warm, dry and intact. No rash noted. Psychiatric: Mood and affect are normal. Speech and behavior are normal. Patient exhibits appropriate insight and judgment.  ____________________________________________    LABS (pertinent  positives/negatives)  CMP wnl except ast 13 CBC wbc 13.5, hgb 14.7, plt 296 Upreg negative UA moderate leukocytes, 11-20 WBC, bacteria rare ____________________________________________   EKG  None  ____________________________________________    RADIOLOGY  None  ____________________________________________   PROCEDURES  Procedures  ____________________________________________   INITIAL IMPRESSION / ASSESSMENT AND PLAN / ED COURSE  Pertinent labs & imaging results that were available during my care of the patient were reviewed by me and considered in my medical decision making (see chart for details).   Patient presented to the emergency department today with lower abdominal pain.  On exam she did have some lower abdominal tenderness worse on the left side.  Differential would be broad including colitis, gastroenteritis, urinary tract infection, ectopic pregnancy, ovarian torsion or tubo-ovarian abscess, diverticulitis, kidney stone amongst other etiologies.  Patient's blood work with mild leukocytosis.  Patient's urine is consistent with a urinary tract infection.  At this point I have low suspicion for concerning intra-abdominal infection given location of most tenderness and minimally elevated leukocytosis.  Patient did feel better after Bentyl.  Discussed imaging with the patient however she felt comfortable deferring at this time which I think is completely reasonable.  Will treat for urinary tract infection.  Will also give Bentyl for abdominal discomfort.  Discussed return precautions. ____________________________________________   FINAL CLINICAL IMPRESSION(S) / ED DIAGNOSES  Final diagnoses:  Lower abdominal pain  Lower urinary tract infectious disease     Note: This dictation was prepared with Dragon dictation. Any transcriptional errors that result from this process are unintentional     Nance Pear, MD 10/24/18 [phone]

## 2018-10-24 NOTE — ED Notes (Signed)
Pt has low abd pain.  No vag bleeding or discharge.  Pt denies urinary sx.   Pt reports lower back pain.  Pt alert.Marland Kitchen

## 2018-10-24 NOTE — Discharge Instructions (Addendum)
Please seek medical attention for any high fevers, chest pain, shortness of breath, change in behavior, persistent vomiting, bloody stool or any other new or concerning symptoms.  

## 2018-11-17 ENCOUNTER — Encounter: Payer: BLUE CROSS/BLUE SHIELD | Admitting: Certified Nurse Midwife

## 2018-12-07 ENCOUNTER — Telehealth: Payer: Self-pay

## 2018-12-07 NOTE — Telephone Encounter (Signed)
Coronavirus (COVID-19) Are you at risk?  Are you at risk for the Coronavirus (COVID-19)?  To be considered HIGH RISK for Coronavirus (COVID-19), you have to meet the following criteria:  . Traveled to [place], [place], [place], [place] or [place]; or in the [place] to [place], [place], [place], or [place]; and have fever, cough, and shortness of breath within the last 2 weeks of travel OR . Been in close contact with a person diagnosed with COVID-19 within the last 2 weeks and have fever, cough, and shortness of breath . IF YOU DO NOT MEET THESE CRITERIA, YOU ARE CONSIDERED LOW RISK FOR COVID-19.  What to do if you are HIGH RISK for COVID-19?  . If you are having a medical emergency, call 911. . Seek medical care right away. Before you go to a doctor's office, urgent care or emergency department, call ahead and tell them about your recent travel, contact with someone diagnosed with COVID-19, and your symptoms. You should receive instructions from your physician's office regarding next steps of care.  . When you arrive at healthcare provider, tell the healthcare staff immediately you have returned from visiting [place], [place], [place], [place] or [place]; or traveled in the [place] to [place], [place], [place], or [place]; in the last two weeks or you have been in close contact with a person diagnosed with COVID-19 in the last 2 weeks.   . Tell the health care staff about your symptoms: fever, cough and shortness of breath. . After you have been seen by a medical provider, you will be either: o Tested for (COVID-19) and discharged home on quarantine except to seek medical care if symptoms worsen, and asked to  - Stay home and avoid contact with others until you get your results (4-5 days)  - Avoid travel on public transportation if possible (such as bus, train, or airplane) or o Sent to the Emergency Department by EMS for evaluation, COVID-19 testing, and possible  admission depending on your condition and test results.  What to do if you are LOW RISK for COVID-19?  Reduce your risk of any infection by using the same precautions used for avoiding the common cold or flu:  . Wash your hands often with soap and warm water for at least 20 seconds.  If soap and water are not readily available, use an alcohol-based hand sanitizer with at least 60% alcohol.  . If coughing or sneezing, cover your mouth and nose by coughing or sneezing into the elbow areas of your shirt or coat, into a tissue or into your sleeve (not your hands). . Avoid shaking hands with others and consider head nods or verbal greetings only. . Avoid touching your eyes, nose, or mouth with unwashed hands.  . Avoid close contact with people who are [place]uan [place]. . Avoid places or events with large numbers of people in one location, like concerts or sporting events. . Carefully consider travel plans you have or are making. . If you are planning any travel outside or inside the [place], visit the CDC's Travelers' Health webpage for the latest health notices. . If you have some symptoms but not all symptoms, continue to monitor at home and seek medical attention if your symptoms worsen. . If you are having a medical emergency, call 911.  12/07/18 SCREENING NEG [facility] ADDITIONAL HEALTHCARE OPTIONS FOR PATIENTS  [facility] / e-Visit: [email or web address]         [facility]: [phone]    [facility]: [phone]                   [facility]: [phone]  

## 2018-12-08 ENCOUNTER — Encounter: Payer: Self-pay | Admitting: Certified Nurse Midwife

## 2018-12-08 ENCOUNTER — Ambulatory Visit (INDEPENDENT_AMBULATORY_CARE_PROVIDER_SITE_OTHER): Payer: BC Managed Care – PPO | Admitting: Certified Nurse Midwife

## 2018-12-08 ENCOUNTER — Other Ambulatory Visit (HOSPITAL_COMMUNITY)
Admission: RE | Admit: 2018-12-08 | Discharge: 2018-12-08 | Disposition: A | Discharge status: Home / Self Care | Payer: BC Managed Care – PPO | Source: Ambulatory Visit | Attending: Certified Nurse Midwife | Admitting: Certified Nurse Midwife

## 2018-12-08 ENCOUNTER — Other Ambulatory Visit: Payer: Self-pay

## 2018-12-08 VITALS — BP 86/61 | HR 74 | Ht 61.0 in | Wt 228.3 lb

## 2018-12-08 DIAGNOSIS — Z01419 Encounter for gynecological examination (general) (routine) without abnormal findings: Secondary | ICD-10-CM | POA: Diagnosis not present

## 2018-12-08 DIAGNOSIS — N941 Unspecified dyspareunia: Secondary | ICD-10-CM

## 2018-12-08 DIAGNOSIS — Z202 Contact with and (suspected) exposure to infections with a predominantly sexual mode of transmission: Secondary | ICD-10-CM

## 2018-12-08 DIAGNOSIS — B9689 Other specified bacterial agents as the cause of diseases classified elsewhere: Secondary | ICD-10-CM | POA: Insufficient documentation

## 2018-12-08 DIAGNOSIS — N760 Acute vaginitis: Secondary | ICD-10-CM | POA: Diagnosis not present

## 2018-12-08 NOTE — Patient Instructions (Signed)
Preventive Care 21-23 Years Old, Female Preventive care refers to visits with your health care provider and lifestyle choices that can promote health and wellness. This includes:  A yearly physical exam. This may also be called an annual well check.  Regular dental visits and eye exams.  Immunizations.  Screening for certain conditions.  Healthy lifestyle choices, such as eating a healthy diet, getting regular exercise, not using drugs or products that contain nicotine and tobacco, and limiting alcohol use. What can I expect for my preventive care visit? Physical exam Your health care provider will check your:  Height and weight. This may be used to calculate body mass index (BMI), which tells if you are at a healthy weight.  Heart rate and blood pressure.  Skin for abnormal spots. Counseling Your health care provider may ask you questions about your:  Alcohol, tobacco, and drug use.  Emotional well-being.  Home and relationship well-being.  Sexual activity.  Eating habits.  Work and work environment.  Method of birth control.  Menstrual cycle.  Pregnancy history. What immunizations do I need?  Influenza (flu) vaccine  This is recommended every year. Tetanus, diphtheria, and pertussis (Tdap) vaccine  You may need a Td booster every 10 years. Varicella (chickenpox) vaccine  You may need this if you have not been vaccinated. Human papillomavirus (HPV) vaccine  If recommended by your health care provider, you may need three doses over 6 months. Measles, mumps, and rubella (MMR) vaccine  You may need at least one dose of MMR. You may also need a second dose. Meningococcal conjugate (MenACWY) vaccine  One dose is recommended if you are age 19-21 years and a first-year college student living in a residence hall, or if you have one of several medical conditions. You may also need additional booster doses. Pneumococcal conjugate (PCV13) vaccine  You may need  this if you have certain conditions and were not previously vaccinated. Pneumococcal polysaccharide (PPSV23) vaccine  You may need one or two doses if you smoke cigarettes or if you have certain conditions. Hepatitis A vaccine  You may need this if you have certain conditions or if you travel or work in places where you may be exposed to hepatitis A. Hepatitis B vaccine  You may need this if you have certain conditions or if you travel or work in places where you may be exposed to hepatitis B. Haemophilus influenzae type b (Hib) vaccine  You may need this if you have certain conditions. You may receive vaccines as individual doses or as more than one vaccine together in one shot (combination vaccines). Talk with your health care provider about the risks and benefits of combination vaccines. What tests do I need?  Blood tests  Lipid and cholesterol levels. These may be checked every 5 years starting at age 20.  Hepatitis C test.  Hepatitis B test. Screening  Diabetes screening. This is done by checking your blood sugar (glucose) after you have not eaten for a while (fasting).  Sexually transmitted disease (STD) testing.  BRCA-related cancer screening. This may be done if you have a family history of breast, ovarian, tubal, or peritoneal cancers.  Pelvic exam and Pap test. This may be done every 3 years starting at age 21. Starting at age 30, this may be done every 5 years if you have a Pap test in combination with an HPV test. Talk with your health care provider about your test results, treatment options, and if necessary, the need for more tests.   Follow these instructions at home: Eating and drinking   Eat a diet that includes fresh fruits and vegetables, whole grains, lean protein, and low-fat dairy.  Take vitamin and mineral supplements as recommended by your health care provider.  Do not drink alcohol if: ? Your health care provider tells you not to drink. ? You are  pregnant, may be pregnant, or are planning to become pregnant.  If you drink alcohol: ? Limit how much you have to 0-1 drink a day. ? Be aware of how much alcohol is in your drink. In the [place] one drink equals one 12 oz bottle of beer (355 mL), one 5 oz glass of wine (148 mL), or one 1 oz glass of hard liquor (44 mL). Lifestyle  Take daily care of your teeth and gums.  Stay active. Exercise for at least 30 minutes on 5 or more days each week.  Do not use any products that contain nicotine or tobacco, such as cigarettes, e-cigarettes, and chewing tobacco. If you need help quitting, ask your health care provider.  If you are sexually active, practice safe sex. Use a condom or other form of birth control (contraception) in order to prevent pregnancy and STIs (sexually transmitted infections). If you plan to become pregnant, see your health care provider for a preconception visit. What's next?  Visit your health care provider once a year for a well check visit.  Ask your health care provider how often you should have your eyes and teeth checked.  Stay up to date on all vaccines. This information is not intended to replace advice given to you by your health care provider. Make sure you discuss any questions you have with your health care provider. Document Released: 06/01/2001 Document Revised: 12/15/2017 Document Reviewed: 12/15/2017 Elsevier Patient Education  2020 Elsevier Inc.  

## 2018-12-08 NOTE — Progress Notes (Signed)
GYNECOLOGY ANNUAL PREVENTATIVE CARE ENCOUNTER NOTE  History:     Casey Fuentes is a 23 y.o. G26P1001 female here for a routine annual gynecologic exam.  Current complaints: pain with intercourse.   Denies abnormal vaginal bleeding, discharge, pelvic pain, problems with intercourse or other gynecologic concerns.    Gynecologic History No LMP recorded. (Menstrual status: IUD). Contraception: IUD Last Pap: 01/20/17. Results were: normal Last mammogram: n/a.  Obstetric History OB History  Gravida Para Term Preterm AB Living  1 1 1     1   SAB TAB Ectopic Multiple Live Births        0 1    # Outcome Date GA Lbr Len/2nd Weight Sex Delivery Anes PTL Lv  1 Term 08/02/17 [redacted]w[redacted]d  7 lb 10.8 oz (3.48 kg) M CS-LTranv EPI, Gen  LIV    Past Medical History:  Diagnosis Date  . Anxiety 10/22/2017  . Chlamydia infection during pregnancy 02/03/2017  . Deliberate self-cutting 10/22/2017   Age 56-18  . Morbid obesity ([place]) 06/01/2017  . Postpartum depression 10/22/2017    Past Surgical History:  Procedure Laterality Date  . CESAREAN SECTION N/A 08/02/2017   Procedure: CESAREAN SECTION;  Surgeon: Rubie Maid, MD;  Location: [facility] ORS;  Service: Obstetrics;  Laterality: N/A;  . TONSILLECTOMY      Current Outpatient Medications on File Prior to Visit  Medication Sig Dispense Refill  . dicyclomine (BENTYL) 20 MG tablet Take 1 tablet (20 mg total) by mouth 3 (three) times daily as needed. 30 tablet 0  . sertraline (ZOLOFT) 50 MG tablet Take 1 tablet (50 mg total) by mouth daily. 30 tablet 3   No current facility-administered medications on file prior to visit.     Allergies  Allergen Reactions  . Amoxicillin     Social History:  reports that she has never smoked. She has never used smokeless tobacco. She reports that she does not drink alcohol or use drugs.  Family History  Problem Relation Age of Onset  . Diabetes Mother   . Thyroid disease Mother   . Cirrhosis Mother     The following  portions of the patient's history were reviewed and updated as appropriate: allergies, current medications, past family history, past medical history, past social history, past surgical history and problem list.  Review of Systems Pertinent items noted in HPI and remainder of comprehensive ROS otherwise negative.  Physical Exam:  There were no vitals taken for this visit. CONSTITUTIONAL: Well-developed, well-nourished obese female in no acute distress.  HENT:  Normocephalic, atraumatic, External right and left ear normal. Oropharynx is clear and moist EYES: Conjunctivae and EOM are normal. Pupils are equal, round, and reactive to light. No scleral icterus.  NECK: Normal range of motion, supple, no masses.  Normal thyroid.  SKIN: Skin is warm and dry. No rash noted. Not diaphoretic. No erythema. No pallor. MUSCULOSKELETAL: Normal range of motion. No tenderness.  No cyanosis, clubbing, or edema.  2+ distal pulses. NEUROLOGIC: Alert and oriented to person, place, and time. Normal reflexes, muscle tone coordination. No cranial nerve deficit noted. PSYCHIATRIC: Normal mood and affect. Normal behavior. Normal judgment and thought content. CARDIOVASCULAR: Normal heart rate noted, regular rhythm RESPIRATORY: Clear to auscultation bilaterally. Effort and breath sounds normal, no problems with respiration noted. BREASTS: Symmetric in size. No masses, skin changes, nipple drainage, or lymphadenopathy. ABDOMEN: Soft, normal bowel sounds, no distention noted.  No tenderness, rebound or guarding.  PELVIC: Normal appearing external genitalia; normal appearing vaginal mucosa and cervix.  No abnormal discharge noted.  Pap smear not indicated. Swab collected  Normal uterine size, no other palpable masses, no uterine or adnexal tenderness.   Assessment and Plan:  Annual Well Women GYN Exam  Pap Smear: not due until 2021 Mammogram not indicated  Labs: vaginal swab, pt declines lipid panel U/s ordered to check  for IUD placement  Routine preventative health maintenance measures emphasized. Please refer to After Visit Summary for other counseling recommendations.      Doreene BurkeAnnie Caitlyne Ingham, CNM

## 2018-12-13 ENCOUNTER — Telehealth: Payer: Self-pay

## 2018-12-13 ENCOUNTER — Other Ambulatory Visit: Payer: Self-pay | Admitting: Certified Nurse Midwife

## 2018-12-13 ENCOUNTER — Telehealth: Payer: Self-pay | Admitting: Certified Nurse Midwife

## 2018-12-13 LAB — CERVICOVAGINAL ANCILLARY ONLY
Bacterial vaginitis: POSITIVE — AB
Candida vaginitis: NEGATIVE
Chlamydia: POSITIVE — AB
Neisseria Gonorrhea: NEGATIVE
Trichomonas: NEGATIVE

## 2018-12-13 MED ORDER — METRONIDAZOLE 500 MG PO TABS: 500.0000 mg | ORAL_TABLET | Freq: Two times a day (BID) | ORAL | 0 refills | Status: AC

## 2018-12-13 MED ORDER — AZITHROMYCIN 500 MG PO TABS: 1000.0000 mg | ORAL_TABLET | Freq: Once | ORAL | 1 refills | Status: AC

## 2018-12-13 NOTE — Telephone Encounter (Signed)
mychart message sent

## 2018-12-13 NOTE — Progress Notes (Signed)
Pt swab positive for BV and chlamydia. Orders placed for treatment. Pt notified via my chart.   Philip Aspen, CNM

## 2018-12-13 NOTE — Telephone Encounter (Signed)
The patient called and stated that she needs to speak with a provider as soon as possible in regards to her needing to know more information about her results. Pt would not disclose any other information. Please advise.

## 2018-12-13 NOTE — Telephone Encounter (Signed)
Patient returned call- she had questions about her test results. States she did not get ATs message regarding results. Results given to patient. All questions answered. Information on chlamydia and BV sent to patient via [employer] per patient request.

## 2018-12-26 ENCOUNTER — Other Ambulatory Visit: Payer: Self-pay

## 2018-12-26 DIAGNOSIS — R6889 Other general symptoms and signs: Secondary | ICD-10-CM | POA: Diagnosis not present

## 2018-12-26 DIAGNOSIS — Z20822 Contact with and (suspected) exposure to covid-19: Secondary | ICD-10-CM

## 2018-12-28 LAB — NOVEL CORONAVIRUS, NAA: SARS-CoV-2, NAA: NOT DETECTED

## 2019-03-06 ENCOUNTER — Other Ambulatory Visit: Payer: Self-pay

## 2019-03-06 DIAGNOSIS — Z20822 Contact with and (suspected) exposure to covid-19: Secondary | ICD-10-CM

## 2019-03-08 LAB — NOVEL CORONAVIRUS, NAA: SARS-CoV-2, NAA: NOT DETECTED

## 2019-03-30 DIAGNOSIS — Z20828 Contact with and (suspected) exposure to other viral communicable diseases: Secondary | ICD-10-CM | POA: Diagnosis not present

## 2019-04-09 ENCOUNTER — Ambulatory Visit
Admission: EM | Admit: 2019-04-09 | Discharge: 2019-04-09 | Disposition: A | Discharge status: Home / Self Care | Payer: BC Managed Care – PPO | Attending: Emergency Medicine | Admitting: Emergency Medicine

## 2019-04-09 ENCOUNTER — Other Ambulatory Visit: Payer: Self-pay

## 2019-04-09 DIAGNOSIS — M791 Myalgia, unspecified site: Secondary | ICD-10-CM | POA: Diagnosis not present

## 2019-04-09 DIAGNOSIS — R519 Headache, unspecified: Secondary | ICD-10-CM | POA: Diagnosis not present

## 2019-04-09 DIAGNOSIS — J029 Acute pharyngitis, unspecified: Secondary | ICD-10-CM | POA: Diagnosis not present

## 2019-04-09 DIAGNOSIS — Z20828 Contact with and (suspected) exposure to other viral communicable diseases: Secondary | ICD-10-CM

## 2019-04-09 DIAGNOSIS — R05 Cough: Secondary | ICD-10-CM | POA: Diagnosis not present

## 2019-04-09 DIAGNOSIS — Z20822 Contact with and (suspected) exposure to covid-19: Secondary | ICD-10-CM

## 2019-04-09 MED ORDER — BENZONATATE 200 MG PO CAPS: 200.0000 mg | ORAL_CAPSULE | Freq: Three times a day (TID) | ORAL | 0 refills | Status: DC | PRN

## 2019-04-09 NOTE — ED Provider Notes (Signed)
HPI  SUBJECTIVE:  Casey Fuentes is a 23 y.o. female who presents with 2 days of headaches, body aches, sore throat, cough.  She states that her 20-year-old son has Covid.  No nasal congestion, loss of sense of smell or taste, fevers, shortness of breath, nausea, vomiting, diarrhea, abdominal pain.  States that she is able to sleep through the night without waking up coughing.  She has not tried anything for this.  No aggravating or alleviating factors.  Past medical history negative for pulmonary disease, smoking, diabetes, coronary artery disease, HIV, cancer, immunocompromise.  LMP: Amenorrheic.  Denies the possibility of being pregnant.  PMD: None.  Past Medical History:  Diagnosis Date  . Anxiety 10/22/2017  . Chlamydia infection during pregnancy 02/03/2017  . Deliberate self-cutting 10/22/2017   Age 68-18  . Morbid obesity ([facility]) 06/01/2017  . Postpartum depression 10/22/2017    Past Surgical History:  Procedure Laterality Date  . CESAREAN SECTION N/A 08/02/2017   Procedure: CESAREAN SECTION;  Surgeon: Hildred Laser, MD;  Location: [facility] OR[facility];  Service: Obstetrics;  Laterality: N/A;  . TONSILLECTOMY      Family History  Problem Relation Age of Onset  . Diabetes Mother   . Thyroid disease Mother   . Cirrhosis Mother   . Healthy Father     Social History   Tobacco Use  . Smoking status: Never Smoker  . Smokeless tobacco: Never Used  Substance Use Topics  . Alcohol use: No  . Drug use: No    No current facility-administered medications for this encounter.  Current Outpatient Medications:  .  benzonatate (TESSALON) 200 MG capsule, Take 1 capsule (200 mg total) by mouth 3 (three) times daily as needed for cough., Disp: 30 capsule, Rfl: 0 .  sertraline (ZOLOFT) 50 MG tablet, Take 1 tablet (50 mg total) by mouth daily. (Patient not taking: Reported on 12/08/2018), Disp: 30 tablet, Rfl: 3  Allergies  Allergen Reactions  . Amoxicillin      ROS  As noted in HPI.   Physical  Exam  BP 109/73 (BP Location: Right Arm)   Pulse 91   Temp 98.7 F (37.1 C) (Oral)   Resp 16   SpO2 95%   Constitutional: Well developed, well nourished, no acute distress Eyes:  EOMI, conjunctiva normal bilaterally HENT: Normocephalic, atraumatic,mucus membranes moist.  No nasal congestion.   Neck: No cervical lymphadenopathy, meningismus  respiratory: Normal inspiratory effort, lungs clear bilaterally Cardiovascular: Normal rate regular rhythm no murmurs rubs or gallops GI: nondistended skin: No rash, skin intact Musculoskeletal: no deformities Neurologic: Alert & oriented x 3, no focal neuro deficits Psychiatric: Speech and behavior appropriate   ED Course   Medications - No data to display  Orders Placed This Encounter  Procedures  . Novel Coronavirus, NAA ([facility])    Standing Status:   Standing    Number of Occurrences:   1    No results found for this or any previous visit (from the past 24 hour(s)). No results found.  ED Clinical Impression  1. Close exposure to COVID-19 virus   2. Suspected COVID-19 virus infection      ED Assessment/Plan  Suspect Covid.  Covid PCR sent.  Sending home with Tessalon.  Primary care list.  Discussed  MDM, treatment plan, and plan for follow-up with patient. Discussed sn/sx that should prompt return to the ED. patient agrees with plan.   Meds ordered this encounter  Medications  . benzonatate (TESSALON) 200 MG capsule    Sig:  Take 1 capsule (200 mg total) by mouth 3 (three) times daily as needed for cough.    Dispense:  30 capsule    Refill:  0    *This clinic note was created using [employer]. Therefore, there may be occasional mistakes despite careful proofreading.   ?   Melynda Ripple, MD 04/10/19 [phone]

## 2019-04-09 NOTE — Discharge Instructions (Addendum)
Covid PCR test will take anywhere from 18 to 36 hours to come back.  You can try vitamin C 500 mg twice daily, vitamin D3 5000 units once daily, zinc 50 to 75 mg once daily.  Gargle with Listerine.  Tessalon for the cough

## 2019-04-09 NOTE — ED Triage Notes (Signed)
Pt presents to UC stating she has had a positive covid exposure. Pt c/o sore throat, body aches, headaches, dizziness x2 days.

## 2019-04-11 ENCOUNTER — Telehealth: Payer: Self-pay | Admitting: Emergency Medicine

## 2019-04-11 LAB — NOVEL CORONAVIRUS, NAA: SARS-CoV-2, NAA: DETECTED — AB

## 2019-04-11 NOTE — Telephone Encounter (Signed)
Your test for COVID-19 was positive, meaning that you were infected with the novel coronavirus and could give the germ to others.  Please continue isolation at home for at least 10 days since the start of your symptoms. If you do not have symptoms, please isolate at home for 10 days from the day you were tested. Once you complete your 10 day quarantine, you may return to normal activities as long as you've not had a fever for over 24 hours(without taking fever reducing medicine) and your symptoms are improving. Please continue good preventive care measures, including:  frequent hand-washing, avoid touching your face, cover coughs/sneezes, stay out of crowds and keep a 6 foot distance from others.  Go to the nearest hospital emergency room if fever/cough/breathlessness are severe or illness seems like a threat to life.  Patient contacted by phone and made aware of    results. Pt verbalized understanding. She had no questions.

## 2019-06-07 ENCOUNTER — Encounter: Payer: BC Managed Care – PPO | Admitting: Obstetrics and Gynecology

## 2019-06-07 ENCOUNTER — Encounter: Payer: Self-pay | Admitting: Obstetrics and Gynecology

## 2019-06-13 ENCOUNTER — Ambulatory Visit
Admission: EM | Admit: 2019-06-13 | Discharge: 2019-06-13 | Disposition: A | Discharge status: Home / Self Care | Payer: BC Managed Care – PPO | Attending: Emergency Medicine | Admitting: Emergency Medicine

## 2019-06-13 ENCOUNTER — Other Ambulatory Visit: Payer: Self-pay

## 2019-06-13 DIAGNOSIS — Z87440 Personal history of urinary (tract) infections: Secondary | ICD-10-CM | POA: Insufficient documentation

## 2019-06-13 DIAGNOSIS — Z113 Encounter for screening for infections with a predominantly sexual mode of transmission: Secondary | ICD-10-CM

## 2019-06-13 DIAGNOSIS — R300 Dysuria: Secondary | ICD-10-CM

## 2019-06-13 DIAGNOSIS — R350 Frequency of micturition: Secondary | ICD-10-CM | POA: Diagnosis present

## 2019-06-13 DIAGNOSIS — R3915 Urgency of urination: Secondary | ICD-10-CM

## 2019-06-13 DIAGNOSIS — N898 Other specified noninflammatory disorders of vagina: Secondary | ICD-10-CM | POA: Diagnosis not present

## 2019-06-13 DIAGNOSIS — Z880 Allergy status to penicillin: Secondary | ICD-10-CM | POA: Diagnosis not present

## 2019-06-13 DIAGNOSIS — M545 Low back pain: Secondary | ICD-10-CM | POA: Insufficient documentation

## 2019-06-13 LAB — POCT URINALYSIS DIP (MANUAL ENTRY)
Glucose, UA: 100 mg/dL — AB
Nitrite, UA: POSITIVE — AB
Protein Ur, POC: >=300 mg/dL — AB
Spec Grav, UA: >=1.03 — AB (ref 1.010–1.025)
Urobilinogen, UA: 4 E.U./dL — AB
pH, UA: 5.5 (ref 5.0–8.0)

## 2019-06-13 LAB — POCT URINE PREGNANCY: Preg Test, Ur: NEGATIVE

## 2019-06-13 MED ORDER — NITROFURANTOIN MONOHYD MACRO 100 MG PO CAPS: 100.0000 mg | ORAL_CAPSULE | Freq: Two times a day (BID) | ORAL | 0 refills | Status: DC

## 2019-06-13 MED ORDER — PHENAZOPYRIDINE HCL 200 MG PO TABS: 200.0000 mg | ORAL_TABLET | Freq: Three times a day (TID) | ORAL | 0 refills | Status: DC

## 2019-06-13 NOTE — ED Provider Notes (Signed)
[facility]   CC: "UTI"  SUBJECTIVE:  Casey Fuentes is a 24 y.o. female who complains of dysuria, frequent urination, hematuria, abdominal pressure, and lower back pain x 2 days.  Admits to caffeine intake and sexual activity.  Last sex 2 days ago. Also mentions lower abdominal/ bladder pressure, and low back pain. Has tried OTC AZO with minimal relief.  Symptoms are made worse with urination.  Admits to similar symptoms in the past with UTI.    Complains of associated vaginal odor.  Requests STD testing.  Admits to two female partners over the past 6 months.    Denies fever, chills, nausea, vomiting, abnormal vaginal discharge or bleeding, vaginal itching, vaginal irritation.  LMP: No LMP recorded. (Menstrual status: IUD).  ROS: As in HPI.  All other pertinent ROS negative.     Past Medical History:  Diagnosis Date  . Anxiety 10/22/2017  . Chlamydia infection during pregnancy 02/03/2017  . Deliberate self-cutting 10/22/2017   Age 24-18  . Morbid obesity ([place]) 06/01/2017  . Postpartum depression 10/22/2017   Past Surgical History:  Procedure Laterality Date  . CESAREAN SECTION N/A 08/02/2017   Procedure: CESAREAN SECTION;  Surgeon: Rubie Maid, MD;  Location: [facility] ORS;  Service: Obstetrics;  Laterality: N/A;  . TONSILLECTOMY     Allergies  Allergen Reactions  . Amoxicillin    No current facility-administered medications on file prior to encounter.   Current Outpatient Medications on File Prior to Encounter  Medication Sig Dispense Refill  . sertraline (ZOLOFT) 50 MG tablet Take 1 tablet (50 mg total) by mouth daily. (Patient not taking: Reported on 12/08/2018) 30 tablet 3   Social History   Socioeconomic History  . Marital status: Single    Spouse name: Not on file  . Number of children: Not on file  . Years of education: Not on file  . Highest education level: Not on file  Occupational History  . Not on file  Tobacco Use  . Smoking status: Never Smoker  .  Smokeless tobacco: Never Used  Substance and Sexual Activity  . Alcohol use: No  . Drug use: No  . Sexual activity: Yes    Partners: Male    Birth control/protection: None, I.U.D.  Other Topics Concern  . Not on file  Social History Narrative  . Not on file   Social Determinants of Health   Financial Resource Strain:   . Difficulty of Paying Living Expenses: Not on file  Food Insecurity:   . Worried About Charity fundraiser in the Last Year: Not on file  . Ran Out of Food in the Last Year: Not on file  Transportation Needs:   . Lack of Transportation (Medical): Not on file  . Lack of Transportation (Non-Medical): Not on file  Physical Activity:   . Days of Exercise per Week: Not on file  . Minutes of Exercise per Session: Not on file  Stress:   . Feeling of Stress : Not on file  Social Connections:   . Frequency of Communication with Friends and Family: Not on file  . Frequency of Social Gatherings with Friends and Family: Not on file  . Attends Religious Services: Not on file  . Active Member of Clubs or Organizations: Not on file  . Attends Archivist Meetings: Not on file  . Marital Status: Not on file  Intimate Partner Violence:   . Fear of Current or Ex-Partner: Not on file  . Emotionally Abused: Not on  file  . Physically Abused: Not on file  . Sexually Abused: Not on file   Family History  Problem Relation Age of Onset  . Diabetes Mother   . Thyroid disease Mother   . Cirrhosis Mother   . Healthy Father     OBJECTIVE:  Vitals:   06/13/19 1827  BP: 118/78  Pulse: 73  Resp: 16  Temp: 97.8 F (36.6 C)  TempSrc: Oral  SpO2: 97%   General appearance: Alert in no acute distress HEENT: NCAT.  Oropharynx clear.  Lungs: clear to auscultation bilaterally without adventitious breath sounds Heart: regular rate and rhythm.  Abdomen: soft; non-distended; no tenderness; bowel sounds present; no guarding Back: no CVA tenderness GU: Deferred;  self-vaginal swab obtained Extremities: no edema; symmetrical with no gross deformities Skin: warm and dry Neurologic: Ambulates from chair to exam table without difficulty Psychological: alert and cooperative; normal mood and affect  Labs Reviewed  POCT URINALYSIS DIP (MANUAL ENTRY) - Abnormal; Notable for the following components:      Result Value   Color, UA orange (*)    Clarity, UA cloudy (*)    Glucose, UA =100 (*)    Bilirubin, UA moderate (*)    Ketones, POC UA small (15) (*)    Spec Grav, UA >=1.030 (*)    Blood, UA large (*)    Protein Ur, POC >=300 (*)    Urobilinogen, UA 4.0 (*)    Nitrite, UA Positive (*)    Leukocytes, UA Large (3+) (*)    All other components within normal limits  URINE CULTURE  POCT URINE PREGNANCY  CERVICOVAGINAL ANCILLARY ONLY    ASSESSMENT & PLAN:  1. Dysuria   2. Urinary frequency   3. Urinary urgency   4. Vaginal odor   5. Screening examination for venereal disease     Meds ordered this encounter  Medications  . nitrofurantoin, macrocrystal-monohydrate, (MACROBID) 100 MG capsule    Sig: Take 1 capsule (100 mg total) by mouth 2 (two) times daily.    Dispense:  10 capsule    Refill:  0    Order Specific Question:   Supervising Provider    Answer:   Eustace Moore [[id number]]  . phenazopyridine (PYRIDIUM) 200 MG tablet    Sig: Take 1 tablet (200 mg total) by mouth 3 (three) times daily.    Dispense:  6 tablet    Refill:  0    Order Specific Question:   Supervising Provider    Answer:   Eustace Moore [[id number]]   Urine concerning for UTI Urine culture sent.  We will call you with abnormal results.   Vaginal swab obtained.  We will follow up with you regarding abnormal results Declines HIV/RPR blood test today Push fluids and get plenty of rest.   Take antibiotic as directed and to completion Take pyridium as prescribed and as needed for symptomatic relief Follow up with PCP if symptoms persists Return here or go to ER  if you have any new or worsening symptoms such as fever, worsening abdominal pain, nausea/vomiting, flank pain, etc...  Outlined signs and symptoms indicating need for more acute intervention. Patient verbalized understanding. After Visit Summary given.     Rennis Harding, PA-C 06/13/19 [phone]

## 2019-06-13 NOTE — ED Triage Notes (Signed)
Pt presents to c/o burning, frequent urination, some hematuria, lower back pain, abd pain x2 days.

## 2019-06-13 NOTE — Discharge Instructions (Addendum)
Urine concerning for UTI Urine culture sent.  We will call you with abnormal results.   Vaginal swab obtained.  We will follow up with you regarding abnormal results Declines HIV/RPR blood test today Push fluids and get plenty of rest.   Take antibiotic as directed and to completion Take pyridium as prescribed and as needed for symptomatic relief Follow up with PCP if symptoms persists Return here or go to ER if you have any new or worsening symptoms such as fever, worsening abdominal pain, nausea/vomiting, flank pain, etc..Marland Kitchen

## 2019-06-15 LAB — URINE CULTURE: Culture: >=100000 — AB

## 2019-06-16 ENCOUNTER — Ambulatory Visit
Admission: EM | Admit: 2019-06-16 | Discharge: 2019-06-16 | Disposition: A | Discharge status: Home / Self Care | Payer: BC Managed Care – PPO | Attending: Emergency Medicine | Admitting: Emergency Medicine

## 2019-06-16 DIAGNOSIS — A6009 Herpesviral infection of other urogenital tract: Secondary | ICD-10-CM | POA: Diagnosis present

## 2019-06-16 MED ORDER — VALACYCLOVIR HCL 1 G PO TABS: 1000.0000 mg | ORAL_TABLET | Freq: Two times a day (BID) | ORAL | 0 refills | Status: AC

## 2019-06-16 NOTE — Discharge Instructions (Addendum)
Prescribed valacyclovir Take medications as prescribed and to completion We will follow up with you regarding the results of your test If tests are positive, please abstain from sexual activity until you and your partner(s) are treated Follow up with PCP or [facility] if symptoms persists Return here or go to ER if you have any new or worsening symptoms

## 2019-06-16 NOTE — ED Provider Notes (Addendum)
[facility]    CSN: [id number] Arrival date & time: 06/16/19  1338      History   Chief Complaint Chief Complaint  Patient presents with  . Vaginal Itching    HPI Casey Fuentes is a 24 y.o. female.   Casey Fuentes. Dan Humphreys is a 24 years old female who presented to the urgent care for complaint of bumps on her labia and anal area.  Sore has been painful at times.  Admits to previous sexual activity in the past 5 days.  Has not tried any medication.  Nothing make her symptoms worse.  Denies similar symptoms in the past. Denies fever, chills, nausea, vomiting, abnormal vaginal discharge or bleeding, vaginal irritation.  The history is provided by the patient. No language interpreter was used.  Vaginal Itching    Past Medical History:  Diagnosis Date  . Anxiety 10/22/2017  . Chlamydia infection during pregnancy 02/03/2017  . Deliberate self-cutting 10/22/2017   Age [age]  . Morbid obesity (HCC) 06/01/2017  . Postpartum depression 10/22/2017    Patient Active Problem List   Diagnosis Date Noted  . Postpartum depression 10/22/2017  . Anxiety 10/22/2017  . Deliberate self-cutting 10/22/2017  . Morbid obesity (HCC) 06/01/2017    Past Surgical History:  Procedure Laterality Date  . CESAREAN SECTION N/A 08/02/2017   Procedure: CESAREAN SECTION;  Surgeon: Hildred Laser, MD;  Location: [facility];  Service: Obstetrics;  Laterality: N/A;  . TONSILLECTOMY      OB History    Gravida  1   Para  1   Term  1   Preterm      AB      Living  1     SAB      TAB      Ectopic      Multiple  0   Live Births  1            Home Medications    Prior to Admission medications   Medication Sig Start Date End Date Taking? Authorizing Provider  nitrofurantoin, macrocrystal-monohydrate, (MACROBID) 100 MG capsule Take 1 capsule (100 mg total) by mouth 2 (two) times daily. 06/13/19   Wurst, [place], PA-C  phenazopyridine (PYRIDIUM) 200 MG tablet Take 1 tablet (200  mg total) by mouth 3 (three) times daily. 06/13/19   Wurst, [place], PA-C  sertraline (ZOLOFT) 50 MG tablet Take 1 tablet (50 mg total) by mouth daily. Patient not taking: Reported on 12/08/2018 10/21/17   Hildred Laser, MD  valACYclovir (VALTREX) 1000 MG tablet Take 1 tablet (1,000 mg total) by mouth 2 (two) times daily for 10 days. 06/16/19 06/26/19  Durward Parcel, FNP    Family History Family History  Problem Relation Age of Onset  . Diabetes Mother   . Thyroid disease Mother   . Cirrhosis Mother   . Healthy Father     Social History Social History   Tobacco Use  . Smoking status: Never Smoker  . Smokeless tobacco: Never Used  Substance Use Topics  . Alcohol use: No  . Drug use: No     Allergies   Amoxicillin   Review of Systems Review of Systems  Constitutional: Negative.   Respiratory: Negative.   Cardiovascular: Negative.   Genitourinary:       Vaginal sore present  All other systems reviewed and are negative.    Physical Exam Triage Vital Signs ED Triage Vitals  Enc Vitals Group     BP 06/16/19 1348 104/69  Pulse Rate 06/16/19 1348 75     Resp 06/16/19 1348 18     Temp 06/16/19 1348 97.8 F (36.6 C)     Temp src --      SpO2 06/16/19 1348 96 %     Weight --      Height --      Head Circumference --      Peak Flow --      Pain Score 06/16/19 1347 4     Pain Loc --      Pain Edu? --      Excl. in [place]? --    No data found.  Updated Vital Signs BP 104/69   Pulse 75   Temp 97.8 F (36.6 C)   Resp 18   SpO2 96%   Visual Acuity Right Eye Distance:   Left Eye Distance:   Bilateral Distance:    Right Eye Near:   Left Eye Near:    Bilateral Near:     Physical Exam Vitals and nursing note reviewed. Exam conducted with a chaperone present (Amy RN).  Constitutional:      General: She is not in acute distress.    Appearance: Normal appearance. She is normal weight. She is not ill-appearing or toxic-appearing.  Cardiovascular:     Rate  and Rhythm: Normal rate and regular rhythm.     Pulses: Normal pulses.     Heart sounds: Normal heart sounds. No murmur. No gallop.   Pulmonary:     Effort: Pulmonary effort is normal. No respiratory distress.     Breath sounds: Normal breath sounds. No stridor. No wheezing, rhonchi or rales.  Chest:     Chest wall: No tenderness.  Genitourinary:    General: Normal vulva.     Pubic Area: No rash or pubic lice.      Labia:        Right: No rash, tenderness, lesion or injury.        Left: Tenderness and lesion present. No rash or injury.      Vagina: No vaginal discharge.       Comments: Bump and  lesion present  Neurological:     Mental Status: She is alert.      UC Treatments / Results  Labs (all labs ordered are listed, but only abnormal results are displayed) Labs Reviewed  HSV CULTURE AND TYPING    EKG   Radiology No results found.  Procedures Procedures (including critical care time)  Medications Ordered in UC Medications - No data to display  Initial Impression / Assessment and Plan / UC Course  I have reviewed the triage vital signs and the nursing notes.  Pertinent labs & imaging results that were available during my care of the patient were reviewed by me and considered in my medical decision making (see chart for details).   Patient is stable at discharge. HSV culture and typing was ordered.  Someone will call with abnormal result Valacyclovir was prescribed Advised to practice safe sex To return for worsening of symptoms   Final Clinical Impressions(s) / UC Diagnoses   Final diagnoses:  Genital herpes in women     Discharge Instructions     Prescribed valacyclovir Take medications as prescribed and to completion We will follow up with you regarding the results of your test If tests are positive, please abstain from sexual activity until you and your partner(s) are treated Follow up with PCP or [facility] if symptoms persists Return  here or  go to ER if you have any new or worsening symptoms      ED Prescriptions    Medication Sig Dispense Auth. Provider   valACYclovir (VALTREX) 1000 MG tablet Take 1 tablet (1,000 mg total) by mouth 2 (two) times daily for 10 days. 20 tablet Chrishawn Boley, Zachery Dakins, FNP     PDMP not reviewed this encounter.   Durward Parcel, FNP 06/16/19 1420    Durward Parcel, FNP 06/16/19 1424

## 2019-06-16 NOTE — ED Triage Notes (Signed)
Pt states that she has had bumps develop on her labia that is itchy and painful

## 2019-06-18 ENCOUNTER — Telehealth (HOSPITAL_COMMUNITY): Payer: Self-pay | Admitting: Emergency Medicine

## 2019-06-18 LAB — CERVICOVAGINAL ANCILLARY ONLY
Bacterial vaginitis: POSITIVE — AB
Candida vaginitis: NEGATIVE
Chlamydia: NEGATIVE
Neisseria Gonorrhea: NEGATIVE
Trichomonas: NEGATIVE

## 2019-06-18 MED ORDER — METRONIDAZOLE 500 MG PO TABS: 500.0000 mg | ORAL_TABLET | Freq: Two times a day (BID) | ORAL | 0 refills | Status: AC

## 2019-06-18 NOTE — Telephone Encounter (Signed)
Bacterial vaginosis is positive. Pt needs treatment. Flagyl 500 mg BID x 7 days #14 no refills sent to patients pharmacy of choice.    Patient contacted by phone and made aware of    results. Pt verbalized understanding and had all questions answered.    

## 2019-06-19 LAB — HSV CULTURE AND TYPING

## 2019-06-19 NOTE — Telephone Encounter (Signed)
No.  We can do a PHQ-9 to get a measurement through Mychart and place a referral for Psychiatry.

## 2019-06-20 ENCOUNTER — Encounter: Payer: Self-pay | Admitting: Obstetrics and Gynecology

## 2019-06-21 ENCOUNTER — Telehealth (HOSPITAL_COMMUNITY): Payer: Self-pay | Admitting: Emergency Medicine

## 2019-06-21 NOTE — Telephone Encounter (Signed)
Positive for HSV-2 on swab. Patient contacted by phone and made aware of    results. Pt verbalized understanding and had all questions answered.

## 2019-06-29 ENCOUNTER — Other Ambulatory Visit: Payer: Self-pay

## 2019-06-29 DIAGNOSIS — F53 Postpartum depression: Secondary | ICD-10-CM

## 2019-10-10 ENCOUNTER — Ambulatory Visit
Admission: EM | Admit: 2019-10-10 | Discharge: 2019-10-10 | Disposition: A | Discharge status: Home / Self Care | Payer: BC Managed Care – PPO | Attending: Emergency Medicine | Admitting: Emergency Medicine

## 2019-10-10 ENCOUNTER — Other Ambulatory Visit: Payer: Self-pay

## 2019-10-10 DIAGNOSIS — Z1152 Encounter for screening for COVID-19: Secondary | ICD-10-CM | POA: Diagnosis not present

## 2019-10-10 DIAGNOSIS — J029 Acute pharyngitis, unspecified: Secondary | ICD-10-CM

## 2019-10-10 LAB — POCT RAPID STREP A (OFFICE): Rapid Strep A Screen: NEGATIVE

## 2019-10-10 MED ORDER — CETIRIZINE HCL 10 MG PO TABS: 10.0000 mg | ORAL_TABLET | Freq: Every day | ORAL | 0 refills | Status: DC

## 2019-10-10 MED ORDER — LIDOCAINE VISCOUS HCL 2 % MT SOLN: 15.0000 mL | OROMUCOSAL | 0 refills | Status: DC | PRN

## 2019-10-10 NOTE — ED Triage Notes (Signed)
Pt presents with c/o sore throat and body aches that began today

## 2019-10-10 NOTE — Discharge Instructions (Signed)
Strep test negative, will send out for culture and we will call you with results Covid test  was completed.  It will take 2 to 3 days for results to return Get plenty of rest and push fluids Zyrtec prescribed. Use daily for symptomatic relief Viscous lidocaine prescribed.  This is an oral solution you can swish, and gargle as needed for symptomatic relief of sore throat.  Do not exceed 8 doses in a 24 hour period.  Do not use prior to eating, as this will numb your entire mouth.   Drink warm or cool liquids, use throat lozenges, or popsicles to help alleviate symptoms Take OTC ibuprofen or tylenol as needed for pain Follow up with PCP if symptoms persists Return or go to ER if patient has any new or worsening symptoms such as fever, chills, nausea, vomiting, worsening sore throat, cough, abdominal pain, chest pain, changes in bowel or bladder habits, etc..Marland Kitchen

## 2019-10-10 NOTE — ED Provider Notes (Signed)
[facility]   [id number] 10/10/19 Arrival Time: 1718  Chief Complaint  Patient presents with  . Sore Throat     SUBJECTIVE: History from: patient.  Casey Fuentes is a 24 y.o. female who presents to the urgent care for complaint of sore throat body ache that started this morning.  Denies sick exposure to strep, flu or mono, or precipitating event.  Has tried OTC medication without relief.  Symptoms are made worse with swallowing, but tolerating liquids and own secretions without difficulty.  Denies previous symptoms in the past.   Denies fever, chills, fatigue, ear pain, sinus pain, rhinorrhea, nasal congestion, cough, SOB, wheezing, chest pain, nausea, rash, changes in bowel or bladder habits.      ROS: As per HPI.  All other pertinent ROS negative.      Past Medical History:  Diagnosis Date  . Anxiety 10/22/2017  . Chlamydia infection during pregnancy 02/03/2017  . Deliberate self-cutting 10/22/2017   Age 60-18  . Morbid obesity (HCC) 06/01/2017  . Postpartum depression 10/22/2017   Past Surgical History:  Procedure Laterality Date  . CESAREAN SECTION N/A 08/02/2017   Procedure: CESAREAN SECTION;  Surgeon: Hildred Laser, MD;  Location: [facility];  Service: Obstetrics;  Laterality: N/A;  . TONSILLECTOMY     Allergies  Allergen Reactions  . Amoxicillin    No current facility-administered medications on file prior to encounter.   Current Outpatient Medications on File Prior to Encounter  Medication Sig Dispense Refill  . nitrofurantoin, macrocrystal-monohydrate, (MACROBID) 100 MG capsule Take 1 capsule (100 mg total) by mouth 2 (two) times daily. 10 capsule 0  . phenazopyridine (PYRIDIUM) 200 MG tablet Take 1 tablet (200 mg total) by mouth 3 (three) times daily. 6 tablet 0  . sertraline (ZOLOFT) 50 MG tablet Take 1 tablet (50 mg total) by mouth daily. (Patient not taking: Reported on 12/08/2018) 30 tablet 3   Social History   Socioeconomic History  . Marital  status: Single    Spouse name: Not on file  . Number of children: Not on file  . Years of education: Not on file  . Highest education level: Not on file  Occupational History  . Not on file  Tobacco Use  . Smoking status: Never Smoker  . Smokeless tobacco: Never Used  Vaping Use  . Vaping Use: Never used  Substance and Sexual Activity  . Alcohol use: No  . Drug use: No  . Sexual activity: Yes    Partners: Male    Birth control/protection: None, I.U.D.  Other Topics Concern  . Not on file  Social History Narrative  . Not on file   Social Determinants of Health   Financial Resource Strain:   . Difficulty of Paying Living Expenses:   Food Insecurity:   . Worried About Programme researcher, broadcasting/film/video in the Last Year:   . Barista in the Last Year:   Transportation Needs:   . Freight forwarder (Medical):   Marland Kitchen Lack of Transportation (Non-Medical):   Physical Activity:   . Days of Exercise per Week:   . Minutes of Exercise per Session:   Stress:   . Feeling of Stress :   Social Connections:   . Frequency of Communication with Friends and Family:   . Frequency of Social Gatherings with Friends and Family:   . Attends Religious Services:   . Active Member of Clubs or Organizations:   . Attends Banker Meetings:   Marland Kitchen Marital  Status:   Intimate Partner Violence:   . Fear of Current or Ex-Partner:   . Emotionally Abused:   Marland Kitchen Physically Abused:   . Sexually Abused:    Family History  Problem Relation Age of Onset  . Diabetes Mother   . Thyroid disease Mother   . Cirrhosis Mother   . Healthy Father     OBJECTIVE:  Vitals:   10/10/19 1744  BP: 116/81  Pulse: 85  Resp: 20  Temp: 98.7 F (37.1 C)  SpO2: 96%     General appearance: alert; appears fatigued, but nontoxic, speaking in full sentences and managing own secretions HEENT: NCAT; Ears: EACs clear, TMs pearly gray with visible cone of light, without erythema; Eyes: PERRL, EOMI grossly; Nose: no  obvious rhinorrhea; Throat: oropharynx clear,  Hx of tonsillectomy, uvula midline Neck: supple without LAD Lungs: CTA bilaterally without adventitious breath sounds; cough absent Heart: regular rate and rhythm.  Radial pulses 2+ symmetrical bilaterally Skin: warm and dry Psychological: alert and cooperative; normal mood and affect  LABS: Results for orders placed or performed during the hospital encounter of 10/10/19 (from the past 24 hour(s))  POCT rapid strep A     Status: None   Collection Time: 10/10/19  5:59 PM  Result Value Ref Range   Rapid Strep A Screen Negative Negative     ASSESSMENT & PLAN:  1. Sore throat   2. Encounter for screening for COVID-19     Meds ordered this encounter  Medications  . cetirizine (ZYRTEC ALLERGY) 10 MG tablet    Sig: Take 1 tablet (10 mg total) by mouth daily.    Dispense:  30 tablet    Refill:  0  . lidocaine (XYLOCAINE) 2 % solution    Sig: Use as directed 15 mLs in the mouth or throat as needed for mouth pain.    Dispense:  100 mL    Refill:  0   Discharge instructions  Strep test negative, will send out for culture and we will call you with results Covid test  was completed.  It will take 2 to 3 days for results to return Get plenty of rest and push fluids Zyrtec prescribed. Use daily for symptomatic relief Viscous lidocaine prescribed.  This is an oral solution you can swish, and gargle as needed for symptomatic relief of sore throat.  Do not exceed 8 doses in a 24 hour period.  Do not use prior to eating, as this will numb your entire mouth.   Drink warm or cool liquids, use throat lozenges, or popsicles to help alleviate symptoms Take OTC ibuprofen or tylenol as needed for pain Follow up with PCP if symptoms persists Return or go to ER if patient has any new or worsening symptoms such as fever, chills, nausea, vomiting, worsening sore throat, cough, abdominal pain, chest pain, changes in bowel or bladder habits, etc...  Reviewed  expectations re: course of current medical issues. Questions answered. Outlined signs and symptoms indicating need for more acute intervention. Patient verbalized understanding. After Visit Summary given.    Note: This document was prepared using Dragon voice recognition software and may include unintentional dictation errors.      Emerson Monte, [place] 10/10/19 1802

## 2019-10-11 LAB — SARS-COV-2, NAA 2 DAY TAT

## 2019-10-11 LAB — NOVEL CORONAVIRUS, NAA: SARS-CoV-2, NAA: NOT DETECTED

## 2019-10-13 LAB — CULTURE, GROUP A STREP (THRC)

## 2019-10-29 ENCOUNTER — Ambulatory Visit
Admission: EM | Admit: 2019-10-29 | Discharge: 2019-10-29 | Disposition: A | Discharge status: Home / Self Care | Payer: BC Managed Care – PPO

## 2019-10-29 ENCOUNTER — Other Ambulatory Visit: Payer: Self-pay

## 2019-12-14 ENCOUNTER — Other Ambulatory Visit: Payer: Self-pay

## 2019-12-14 ENCOUNTER — Encounter: Payer: Self-pay | Admitting: Emergency Medicine

## 2019-12-14 ENCOUNTER — Ambulatory Visit
Admission: EM | Admit: 2019-12-14 | Discharge: 2019-12-14 | Disposition: A | Discharge status: Home / Self Care | Payer: BC Managed Care – PPO | Attending: Emergency Medicine | Admitting: Emergency Medicine

## 2019-12-14 DIAGNOSIS — F419 Anxiety disorder, unspecified: Secondary | ICD-10-CM | POA: Diagnosis not present

## 2019-12-14 DIAGNOSIS — R52 Pain, unspecified: Secondary | ICD-10-CM

## 2019-12-14 DIAGNOSIS — Z1152 Encounter for screening for COVID-19: Secondary | ICD-10-CM

## 2019-12-14 MED ORDER — HYDROXYZINE HCL 50 MG PO TABS: 50.0000 mg | ORAL_TABLET | Freq: Three times a day (TID) | ORAL | 0 refills | Status: DC | PRN

## 2019-12-14 NOTE — ED Triage Notes (Addendum)
Body aches since last night, exposed to covid + recently.  Pt would also like a refill on her Zoloft rx, states she has been off of her meds x 1 year.

## 2019-12-14 NOTE — ED Provider Notes (Addendum)
[facility]   [id number] 12/14/19 Arrival Time: 1900   CC: COVID symptoms  SUBJECTIVE: History from: patient.  Casey Fuentes is a 24 y.o. female who presents to the urgent care for complaint of body ache and positive Covid exposure for the past few days.  Denies sick exposure to flu or strep.  Denies recent travel.  Has tried OTC medication without relief.  Denies alleviating or aggravating factors.  Denies previous symptoms in the past.   Denies fever, chills, fatigue, sinus pain, rhinorrhea, sore throat, SOB, wheezing, chest pain, nausea, changes in bowel or bladder habits.    Patient stated he would like a refill on her anxiety medication.  ROS: As per HPI.  All other pertinent ROS negative.     Past Medical History:  Diagnosis Date  . Anxiety 10/22/2017  . Chlamydia infection during pregnancy 02/03/2017  . Deliberate self-cutting 10/22/2017   Age 35-18  . Morbid obesity (HCC) 06/01/2017  . Postpartum depression 10/22/2017   Past Surgical History:  Procedure Laterality Date  . CESAREAN SECTION N/A 08/02/2017   Procedure: CESAREAN SECTION;  Surgeon: Hildred Laser, MD;  Location: [facility];  Service: Obstetrics;  Laterality: N/A;  . TONSILLECTOMY     Allergies  Allergen Reactions  . Amoxicillin    No current facility-administered medications on file prior to encounter.   Current Outpatient Medications on File Prior to Encounter  Medication Sig Dispense Refill  . cetirizine (ZYRTEC ALLERGY) 10 MG tablet Take 1 tablet (10 mg total) by mouth daily. 30 tablet 0  . lidocaine (XYLOCAINE) 2 % solution Use as directed 15 mLs in the mouth or throat as needed for mouth pain. 100 mL 0  . nitrofurantoin, macrocrystal-monohydrate, (MACROBID) 100 MG capsule Take 1 capsule (100 mg total) by mouth 2 (two) times daily. 10 capsule 0  . phenazopyridine (PYRIDIUM) 200 MG tablet Take 1 tablet (200 mg total) by mouth 3 (three) times daily. 6 tablet 0  . sertraline (ZOLOFT) 50 MG tablet  Take 1 tablet (50 mg total) by mouth daily. (Patient not taking: Reported on 12/08/2018) 30 tablet 3   Social History   Socioeconomic History  . Marital status: Single    Spouse name: Not on file  . Number of children: Not on file  . Years of education: Not on file  . Highest education level: Not on file  Occupational History  . Not on file  Tobacco Use  . Smoking status: Never Smoker  . Smokeless tobacco: Never Used  Vaping Use  . Vaping Use: Never used  Substance and Sexual Activity  . Alcohol use: No  . Drug use: No  . Sexual activity: Yes    Partners: Male    Birth control/protection: None, I.U.D.  Other Topics Concern  . Not on file  Social History Narrative  . Not on file   Social Determinants of Health   Financial Resource Strain:   . Difficulty of Paying Living Expenses: Not on file  Food Insecurity:   . Worried About Programme researcher, broadcasting/film/video in the Last Year: Not on file  . Ran Out of Food in the Last Year: Not on file  Transportation Needs:   . Lack of Transportation (Medical): Not on file  . Lack of Transportation (Non-Medical): Not on file  Physical Activity:   . Days of Exercise per Week: Not on file  . Minutes of Exercise per Session: Not on file  Stress:   . Feeling of Stress : Not on file  Social Connections:   . Frequency of Communication with Friends and Family: Not on file  . Frequency of Social Gatherings with Friends and Family: Not on file  . Attends Religious Services: Not on file  . Active Member of Clubs or Organizations: Not on file  . Attends Banker Meetings: Not on file  . Marital Status: Not on file  Intimate Partner Violence:   . Fear of Current or Ex-Partner: Not on file  . Emotionally Abused: Not on file  . Physically Abused: Not on file  . Sexually Abused: Not on file   Family History  Problem Relation Age of Onset  . Diabetes Mother   . Thyroid disease Mother   . Cirrhosis Mother   . Healthy Father      OBJECTIVE:  Vitals:   12/14/19 2004 12/14/19 2006  BP:  110/77  Pulse:  81  Resp:  19  Temp:  98.6 F (37 C)  TempSrc:  Oral  SpO2:  97%  Weight: 200 lb (90.7 kg)   Height: 5\' 1"  (1.549 m)      General appearance: alert; appears fatigued, but nontoxic; speaking in full sentences and tolerating own secretions HEENT: NCAT; Ears: EACs clear, TMs pearly gray; Eyes: PERRL.  EOM grossly intact. Sinuses: nontender; Nose: nares patent without rhinorrhea, Throat: oropharynx clear, tonsils non erythematous or enlarged, uvula midline  Neck: supple without LAD Lungs: unlabored respirations, symmetrical air entry; cough: mild; no respiratory distress; CTAB Heart: regular rate and rhythm.  Radial pulses 2+ symmetrical bilaterally Skin: warm and dry Psychological: alert and cooperative; normal mood and affect  LABS:  No results found for this or any previous visit (from the past 24 hour(s)).   ASSESSMENT & PLAN:  1. Body aches   2. Encounter for screening for COVID-19   3. Anxiety     Meds ordered this encounter  Medications  . hydrOXYzine (ATARAX/VISTARIL) 50 MG tablet    Sig: Take 1 tablet (50 mg total) by mouth 3 (three) times daily as needed.    Dispense:  30 tablet    Refill:  0    Discharge Instructions  COVID testing ordered.  It will take between 4-7 days for test results.  Someone will contact you regarding abnormal results.    In the meantime: You should remain isolated in your home for 10 days from symptom onset AND greater than 72 hours after symptoms resolution (absence of fever without the use of fever-reducing medication and improvement in respiratory symptoms), whichever is longer Get plenty of rest and push fluids Use medications daily for symptom relief Use OTC medications like ibuprofen or tylenol as needed fever or pain Call or go to the ED if you have any new or worsening symptoms such as fever, worsening cough, shortness of breath, chest tightness,  chest pain, turning blue, changes in mental status, etc...   Reviewed expectations re: course of current medical issues. Questions answered. Outlined signs and symptoms indicating need for more acute intervention. Patient verbalized understanding. After Visit Summary given.      Note: This document was prepared using Dragon voice recognition software and may include unintentional dictation errors.    , FNP 12/14/19 2034    2035, FNP 12/14/19 2035

## 2019-12-14 NOTE — Discharge Instructions (Signed)
COVID testing ordered.  It will take between 4-7 days for test results.  Someone will contact you regarding abnormal results.    In the meantime: You should remain isolated in your home for 10 days from symptom onset AND greater than 72 hours after symptoms resolution (absence of fever without the use of fever-reducing medication and improvement in respiratory symptoms), whichever is longer Get plenty of rest and push fluids Use medications daily for symptom relief Use OTC medications like ibuprofen or tylenol as needed fever or pain Call or go to the ED if you have any new or worsening symptoms such as fever, worsening cough, shortness of breath, chest tightness, chest pain, turning blue, changes in mental status, etc... 

## 2019-12-15 LAB — SARS-COV-2, NAA 2 DAY TAT

## 2019-12-15 LAB — NOVEL CORONAVIRUS, NAA: SARS-CoV-2, NAA: NOT DETECTED

## 2020-03-24 ENCOUNTER — Ambulatory Visit
Admission: EM | Admit: 2020-03-24 | Discharge: 2020-03-24 | Disposition: A | Discharge status: Home / Self Care | Payer: BC Managed Care – PPO | Attending: Family Medicine | Admitting: Family Medicine

## 2020-03-24 ENCOUNTER — Encounter: Payer: Self-pay | Admitting: Emergency Medicine

## 2020-03-24 ENCOUNTER — Other Ambulatory Visit: Payer: Self-pay

## 2020-03-24 DIAGNOSIS — N3001 Acute cystitis with hematuria: Secondary | ICD-10-CM | POA: Diagnosis not present

## 2020-03-24 LAB — POCT URINALYSIS DIP (MANUAL ENTRY)
Bilirubin, UA: NEGATIVE
Glucose, UA: NEGATIVE mg/dL
Ketones, POC UA: NEGATIVE mg/dL
Nitrite, UA: POSITIVE — AB
Protein Ur, POC: 30 mg/dL — AB
Spec Grav, UA: 1.02 (ref 1.010–1.025)
Urobilinogen, UA: 0.2 E.U./dL
pH, UA: 7 (ref 5.0–8.0)

## 2020-03-24 LAB — POCT URINE PREGNANCY: Preg Test, Ur: NEGATIVE

## 2020-03-24 MED ORDER — NITROFURANTOIN MONOHYD MACRO 100 MG PO CAPS
100.0000 mg | ORAL_CAPSULE | Freq: Two times a day (BID) | ORAL | 0 refills | Status: AC
Start: 2020-03-24 — End: 2020-04-03

## 2020-03-24 NOTE — ED Provider Notes (Signed)
[facility]    CSN: [id number] Arrival date & time: 03/24/20  1624      History   Chief Complaint Chief Complaint  Patient presents with  . Urinary Tract Infection    HPI Casey Fuentes is a 24 y.o. female.   HPI  Casey Fuentes is a 24 y.o. female presents for evaluation of urinary frequency, urgency and dysuria x 2 days, without flank pain, fever, chills, or abnormal vaginal discharge or bleeding. Reports no concern for STD. rNo LMP recorded. (Menstrual status: IUD).  Past Medical History:  Diagnosis Date  . Anxiety 10/22/2017  . Chlamydia infection during pregnancy 02/03/2017  . Deliberate self-cutting 10/22/2017   Age [age]  . Morbid obesity (HCC) 06/01/2017  . Postpartum depression 10/22/2017    Patient Active Problem List   Diagnosis Date Noted  . Postpartum depression 10/22/2017  . Anxiety 10/22/2017  . Deliberate self-cutting 10/22/2017  . Morbid obesity (HCC) 06/01/2017    Past Surgical History:  Procedure Laterality Date  . CESAREAN SECTION N/A 08/02/2017   Procedure: CESAREAN SECTION;  Surgeon: Hildred Laser, MD;  Location: [facility];  Service: Obstetrics;  Laterality: N/A;  . TONSILLECTOMY      OB History    Gravida  1   Para  1   Term  1   Preterm      AB      Living  1     SAB      TAB      Ectopic      Multiple  0   Live Births  1            Home Medications    Prior to Admission medications   Medication Sig Start Date End Date Taking? Authorizing Provider  cetirizine (ZYRTEC ALLERGY) 10 MG tablet Take 1 tablet (10 mg total) by mouth daily. 10/10/19   Avegno, Zachery Dakins, FNP  hydrOXYzine (ATARAX/VISTARIL) 50 MG tablet Take 1 tablet (50 mg total) by mouth 3 (three) times daily as needed. 12/14/19   Avegno, Zachery Dakins, FNP  lidocaine (XYLOCAINE) 2 % solution Use as directed 15 mLs in the mouth or throat as needed for mouth pain. 10/10/19   Avegno, Zachery Dakins, FNP  nitrofurantoin, macrocrystal-monohydrate, (MACROBID)  100 MG capsule Take 1 capsule (100 mg total) by mouth 2 (two) times daily for 10 days. 03/24/20 04/03/20  Bing Neighbors, FNP  phenazopyridine (PYRIDIUM) 200 MG tablet Take 1 tablet (200 mg total) by mouth 3 (three) times daily. 06/13/19   Wurst, [place], PA-C  sertraline (ZOLOFT) 50 MG tablet Take 1 tablet (50 mg total) by mouth daily. Patient not taking: Reported on 12/08/2018 10/21/17   Hildred Laser, MD    Family History Family History  Problem Relation Age of Onset  . Diabetes Mother   . Thyroid disease Mother   . Cirrhosis Mother   . Healthy Father     Social History Social History   Tobacco Use  . Smoking status: Never Smoker  . Smokeless tobacco: Never Used  Vaping Use  . Vaping Use: Never used  Substance Use Topics  . Alcohol use: No  . Drug use: No     Allergies   Amoxicillin   Review of Systems Review of Systems Pertinent negatives listed in HPI   Physical Exam Triage Vital Signs ED Triage Vitals  Enc Vitals Group     BP 03/24/20 1702 102/72     Pulse Rate 03/24/20 1702 82  Resp 03/24/20 1702 17     Temp 03/24/20 1702 98.2 F (36.8 C)     Temp Source 03/24/20 1702 Oral     SpO2 03/24/20 1702 98 %     Weight --      Height --      Head Circumference --      Peak Flow --      Pain Score 03/24/20 1708 7     Pain Loc --      Pain Edu? --      Excl. in GC? --    No data found.  Updated Vital Signs BP 102/72 (BP Location: Right Arm)   Pulse 82   Temp 98.2 F (36.8 C) (Oral)   Resp 17   SpO2 98%   Visual Acuity Right Eye Distance:   Left Eye Distance:   Bilateral Distance:    Right Eye Near:   Left Eye Near:    Bilateral Near:     Physical Exam General appearance: alert, well developed, well nourished Head: Normocephalic, without obvious abnormality, atraumatic Respiratory: Respirations even and unlabored, normal respiratory rate Heart: rate and rhythm normal.  Abdomen: BS +, no distention, no rebound tenderness, No CVA  tenderness Extremities: No gross deformities Skin: Skin color, texture, turgor normal. No rashes seen  Psych: Appropriate mood and affect.  UC Treatments / Results  Labs (all labs ordered are listed, but only abnormal results are displayed) Labs Reviewed  POCT URINALYSIS DIP (MANUAL ENTRY) - Abnormal; Notable for the following components:      Result Value   Clarity, UA cloudy (*)    Blood, UA moderate (*)    Protein Ur, POC =30 (*)    Nitrite, UA Positive (*)    Leukocytes, UA Large (3+) (*)    All other components within normal limits  URINE CULTURE  POCT URINE PREGNANCY    EKG   Radiology No results found.  Procedures Procedures (including critical care time)  Medications Ordered in UC Medications - No data to display  Initial Impression / Assessment and Plan / UC Course  I have reviewed the triage vital signs and the nursing notes.  Pertinent labs & imaging results that were available during my care of the patient were reviewed by me and considered in my medical decision making (see chart for details).      UA abnormal and findings consistent with UTI. Empiric antibiotic treatment initiated.  Encouraged increase intake of water. Recommended cranberry tablets, wiping from front to back, and urinating prior to and after cortis to reduce risk for reinfection. Urine culture pending.  ER if symptoms become severe. Follow-up with PCP if symptoms do not completely resolve.   Final Clinical Impressions(s) / UC Diagnoses   Final diagnoses:  Acute cystitis with hematuria   Discharge Instructions   None    ED Prescriptions    Medication Sig Dispense Auth. Provider   nitrofurantoin, macrocrystal-monohydrate, (MACROBID) 100 MG capsule Take 1 capsule (100 mg total) by mouth 2 (two) times daily for 10 days. 20 capsule Bing Neighbors, FNP     PDMP not reviewed this encounter.   Bing Neighbors, FNP 04/07/20 [phone]

## 2020-03-24 NOTE — ED Triage Notes (Signed)
uti symptoms started 2 nights ago.  Noticed burning with urination and noticed blood in urine.  Today has had back pain

## 2020-03-27 LAB — URINE CULTURE
Culture: >=100000 — AB
Special Requests: NORMAL

## 2020-05-07 ENCOUNTER — Ambulatory Visit
Admission: EM | Admit: 2020-05-07 | Discharge: 2020-05-07 | Disposition: A | Discharge status: Home / Self Care | Payer: BC Managed Care – PPO | Attending: Emergency Medicine | Admitting: Emergency Medicine

## 2020-05-07 ENCOUNTER — Other Ambulatory Visit: Payer: Self-pay

## 2020-05-07 ENCOUNTER — Encounter: Payer: Self-pay | Admitting: Emergency Medicine

## 2020-05-07 DIAGNOSIS — R52 Pain, unspecified: Secondary | ICD-10-CM

## 2020-05-07 DIAGNOSIS — Z1152 Encounter for screening for COVID-19: Secondary | ICD-10-CM

## 2020-05-07 DIAGNOSIS — J069 Acute upper respiratory infection, unspecified: Secondary | ICD-10-CM

## 2020-05-07 DIAGNOSIS — J029 Acute pharyngitis, unspecified: Secondary | ICD-10-CM

## 2020-05-07 DIAGNOSIS — R509 Fever, unspecified: Secondary | ICD-10-CM

## 2020-05-07 MED ORDER — CETIRIZINE HCL 10 MG PO TABS: 10.0000 mg | ORAL_TABLET | Freq: Every day | ORAL | 0 refills | Status: DC

## 2020-05-07 MED ORDER — BENZONATATE 100 MG PO CAPS
100.0000 mg | ORAL_CAPSULE | Freq: Three times a day (TID) | ORAL | 0 refills | Status: DC | PRN
Start: 2020-05-07 — End: 2021-08-06

## 2020-05-07 MED ORDER — DEXAMETHASONE 4 MG PO TABS: 4.0000 mg | ORAL_TABLET | Freq: Every day | ORAL | 0 refills | Status: AC

## 2020-05-07 NOTE — Discharge Instructions (Addendum)
COVID testing ordered.  It will take between 2-7 days for test results.  Someone will contact you regarding abnormal results.    Get plenty of rest and push fluids Tessalon Perles prescribed for cough Zyrtec for nasal congestion, runny nose, and/or sore throat Decadron was prescribed Use OTC throat lozenges such as Halls, Vicks or Cepacol to soothe throat Gargle with salty warm water daily Use medications daily for symptom relief Use OTC medications like ibuprofen or tylenol as needed fever or pain Call or go to the ED if you have any new or worsening symptoms such as fever, worsening cough, shortness of breath, chest tightness, chest pain, turning blue, changes in mental status, etc..Marland Kitchen

## 2020-05-07 NOTE — ED Triage Notes (Signed)
Body aches, fevers, headache, coughing, stuffy nose since yesterday.

## 2020-05-07 NOTE — ED Provider Notes (Signed)
[facility]   [id number] 05/07/20 Arrival Time: 1809   CC: COVID symptoms  SUBJECTIVE: History from: patient.  Casey Fuentes is a 25 y.o. female who who presented to the urgent care for complaint of fever, cough, runny nose, sore throat, body aches and headache that started yesterday.  Denies sick exposure to COVID, flu or strep.  Denies recent travel.  Has tried OTC medication without relief.  Denies alleviating or aggravating factors.  Denies previous symptoms in the past.   Denies fever, chills, fatigue, sinus pain, rhinorrhea, sore throat, SOB, wheezing, chest pain, nausea, changes in bowel or bladder habits.   .  ROS: As per HPI.  All other pertinent ROS negative.     Past Medical History:  Diagnosis Date  . Anxiety 10/22/2017  . Chlamydia infection during pregnancy 02/03/2017  . Deliberate self-cutting 10/22/2017   Age 24-18  . Morbid obesity (HCC) 06/01/2017  . Postpartum depression 10/22/2017   Past Surgical History:  Procedure Laterality Date  . CESAREAN SECTION N/A 08/02/2017   Procedure: CESAREAN SECTION;  Surgeon: Hildred Laser, MD;  Location: [facility];  Service: Obstetrics;  Laterality: N/A;  . TONSILLECTOMY     Allergies  Allergen Reactions  . Amoxicillin    No current facility-administered medications on file prior to encounter.   Current Outpatient Medications on File Prior to Encounter  Medication Sig Dispense Refill  . hydrOXYzine (ATARAX/VISTARIL) 50 MG tablet Take 1 tablet (50 mg total) by mouth 3 (three) times daily as needed. 30 tablet 0  . lidocaine (XYLOCAINE) 2 % solution Use as directed 15 mLs in the mouth or throat as needed for mouth pain. 100 mL 0  . phenazopyridine (PYRIDIUM) 200 MG tablet Take 1 tablet (200 mg total) by mouth 3 (three) times daily. 6 tablet 0  . sertraline (ZOLOFT) 50 MG tablet Take 1 tablet (50 mg total) by mouth daily. (Patient not taking: Reported on 12/08/2018) 30 tablet 3   Social History   Socioeconomic History   . Marital status: Single    Spouse name: Not on file  . Number of children: Not on file  . Years of education: Not on file  . Highest education level: Not on file  Occupational History  . Not on file  Tobacco Use  . Smoking status: Never Smoker  . Smokeless tobacco: Never Used  Vaping Use  . Vaping Use: Never used  Substance and Sexual Activity  . Alcohol use: No  . Drug use: No  . Sexual activity: Yes    Partners: Male    Birth control/protection: None, I.U.D.  Other Topics Concern  . Not on file  Social History Narrative  . Not on file   Social Determinants of Health   Financial Resource Strain: Not on file  Food Insecurity: Not on file  Transportation Needs: Not on file  Physical Activity: Not on file  Stress: Not on file  Social Connections: Not on file  Intimate Partner Violence: Not on file   Family History  Problem Relation Age of Onset  . Diabetes Mother   . Thyroid disease Mother   . Cirrhosis Mother   . Healthy Father     OBJECTIVE:  Vitals:   05/07/20 1832 05/07/20 1833  BP: 106/62   Pulse: 88   Resp: 18   Temp: 98.9 F (37.2 C)   TempSrc: Oral   SpO2: 96%   Weight:  200 lb (90.7 kg)  Height:  5\' 1"  (1.549 m)  General appearance: alert; appears fatigued, but nontoxic; speaking in full sentences and tolerating own secretions HEENT: NCAT; Ears: EACs clear, TMs pearly gray; Eyes: PERRL.  EOM grossly intact. Sinuses: nontender; Nose: nares patent without rhinorrhea, Throat: oropharynx clear, tonsils non erythematous or enlarged, uvula midline  Neck: supple without LAD Lungs: unlabored respirations, symmetrical air entry; cough: moderate; no respiratory distress; CTAB Heart: regular rate and rhythm.  Radial pulses 2+ symmetrical bilaterally Skin: warm and dry Psychological: alert and cooperative; normal mood and affect  LABS:  No results found for this or any previous visit (from the past 24 hour(s)).   ASSESSMENT & PLAN:  1. Sore  throat   2. URI with cough and congestion   3. Encounter for screening for COVID-19   4. Fever, unspecified   5. Body aches     Meds ordered this encounter  Medications  . dexamethasone (DECADRON) 4 MG tablet    Sig: Take 1 tablet (4 mg total) by mouth daily for 7 days.    Dispense:  7 tablet    Refill:  0  . cetirizine (ZYRTEC ALLERGY) 10 MG tablet    Sig: Take 1 tablet (10 mg total) by mouth daily.    Dispense:  30 tablet    Refill:  0  . benzonatate (TESSALON) 100 MG capsule    Sig: Take 1 capsule (100 mg total) by mouth 3 (three) times daily as needed for cough.    Dispense:  30 capsule    Refill:  0    Discharge instructions  COVID testing ordered.  It will take between 2-7 days for test results.  Someone will contact you regarding abnormal results.    Get plenty of rest and push fluids Tessalon Perles prescribed for cough Zyrtec for nasal congestion, runny nose, and/or sore throat Decadron was prescribed Use OTC throat lozenges such as Halls, Vicks or Cepacol to soothe throat Gargle with salty warm water daily Use medications daily for symptom relief Use OTC medications like ibuprofen or tylenol as needed fever or pain Call or go to the ED if you have any new or worsening symptoms such as fever, worsening cough, shortness of breath, chest tightness, chest pain, turning blue, changes in mental status, etc...   Reviewed expectations re: course of current medical issues. Questions answered. Outlined signs and symptoms indicating need for more acute intervention. Patient verbalized understanding. After Visit Summary given.         Durward Parcel, FNP 05/07/20 1924

## 2020-05-09 LAB — COVID-19, FLU A+B NAA
Influenza A, NAA: NOT DETECTED
Influenza B, NAA: NOT DETECTED
SARS-CoV-2, NAA: DETECTED — AB

## 2020-06-11 ENCOUNTER — Other Ambulatory Visit: Payer: Self-pay

## 2020-06-11 ENCOUNTER — Encounter: Payer: Self-pay | Admitting: Emergency Medicine

## 2020-06-11 ENCOUNTER — Ambulatory Visit
Admission: EM | Admit: 2020-06-11 | Discharge: 2020-06-11 | Disposition: A | Discharge status: Home / Self Care | Payer: Self-pay | Attending: Physician Assistant | Admitting: Physician Assistant

## 2020-06-11 DIAGNOSIS — K121 Other forms of stomatitis: Secondary | ICD-10-CM

## 2020-06-11 MED ORDER — ACYCLOVIR 200 MG PO CAPS: 200.0000 mg | ORAL_CAPSULE | Freq: Four times a day (QID) | ORAL | 0 refills | Status: AC

## 2020-06-11 NOTE — ED Triage Notes (Signed)
Fever blister on bottom lip that came up this morning

## 2020-06-11 NOTE — Discharge Instructions (Addendum)
Return if any problems.

## 2020-06-12 NOTE — ED Provider Notes (Addendum)
[facility]    CSN: [id number] Arrival date & time: 06/11/20  1655      History   Chief Complaint No chief complaint on file.   HPI Casey Fuentes is a 25 y.o. female.   The history is provided by the patient. No language interpreter was used.  Mouth Lesions Location:  Oropharynx Severity:  Mild Chronicity:  New Relieved by:  Nothing Ineffective treatments:  None tried   Past Medical History:  Diagnosis Date  . Anxiety 10/22/2017  . Chlamydia infection during pregnancy 02/03/2017  . Deliberate self-cutting 10/22/2017   Age 89-18  . Morbid obesity (HCC) 06/01/2017  . Postpartum depression 10/22/2017    Patient Active Problem List   Diagnosis Date Noted  . Postpartum depression 10/22/2017  . Anxiety 10/22/2017  . Deliberate self-cutting 10/22/2017  . Morbid obesity (HCC) 06/01/2017    Past Surgical History:  Procedure Laterality Date  . CESAREAN SECTION N/A 08/02/2017   Procedure: CESAREAN SECTION;  Surgeon: Hildred Laser, MD;  Location: [facility];  Service: Obstetrics;  Laterality: N/A;  . TONSILLECTOMY      OB History    Gravida  1   Para  1   Term  1   Preterm      AB      Living  1     SAB      IAB      Ectopic      Multiple  0   Live Births  1            Home Medications    Prior to Admission medications   Medication Sig Start Date End Date Taking? Authorizing Provider  acyclovir (ZOVIRAX) 200 MG capsule Take 1 capsule (200 mg total) by mouth 4 (four) times daily for 5 days. 06/11/20 06/16/20 Yes Cheron Schaumann K, PA-C  benzonatate (TESSALON) 100 MG capsule Take 1 capsule (100 mg total) by mouth 3 (three) times daily as needed for cough. 05/07/20   Avegno, Zachery Dakins, FNP  cetirizine (ZYRTEC ALLERGY) 10 MG tablet Take 1 tablet (10 mg total) by mouth daily. 05/07/20   Avegno, Zachery Dakins, FNP  hydrOXYzine (ATARAX/VISTARIL) 50 MG tablet Take 1 tablet (50 mg total) by mouth 3 (three) times daily as needed. 12/14/19   Avegno,  Zachery Dakins, FNP  lidocaine (XYLOCAINE) 2 % solution Use as directed 15 mLs in the mouth or throat as needed for mouth pain. 10/10/19   Avegno, Zachery Dakins, FNP  phenazopyridine (PYRIDIUM) 200 MG tablet Take 1 tablet (200 mg total) by mouth 3 (three) times daily. 06/13/19   Wurst, [place], PA-C  sertraline (ZOLOFT) 50 MG tablet Take 1 tablet (50 mg total) by mouth daily. Patient not taking: Reported on 12/08/2018 10/21/17   Hildred Laser, MD    Family History Family History  Problem Relation Age of Onset  . Diabetes Mother   . Thyroid disease Mother   . Cirrhosis Mother   . Healthy Father     Social History Social History   Tobacco Use  . Smoking status: Never Smoker  . Smokeless tobacco: Never Used  Vaping Use  . Vaping Use: Never used  Substance Use Topics  . Alcohol use: No  . Drug use: No     Allergies   Amoxicillin   Review of Systems Review of Systems  HENT: Positive for mouth sores.   All other systems reviewed and are negative.    Physical Exam Triage Vital Signs ED Triage Vitals  Enc Vitals  Group     BP 06/11/20 1709 99/68     Pulse Rate 06/11/20 1709 82     Resp 06/11/20 1709 19     Temp 06/11/20 1709 98.9 F (37.2 C)     Temp Source 06/11/20 1709 Oral     SpO2 06/11/20 1709 97 %     Weight --      Height --      Head Circumference --      Peak Flow --      Pain Score 06/11/20 1708 0     Pain Loc --      Pain Edu? --      Excl. in GC? --    No data found.  Updated Vital Signs BP 99/68 (BP Location: Right Arm)   Pulse 82   Temp 98.9 F (37.2 C) (Oral)   Resp 19   SpO2 97%   Visual Acuity Right Eye Distance:   Left Eye Distance:   Bilateral Distance:    Right Eye Near:   Left Eye Near:    Bilateral Near:     Physical Exam Vitals and nursing note reviewed.  Constitutional:      Appearance: She is well-developed and well-nourished.  HENT:     Head: Normocephalic.     Mouth/Throat:     Mouth: Mucous membranes are moist.      Comments: Small ulcer lip Eyes:     Extraocular Movements: EOM normal.  Pulmonary:     Effort: Pulmonary effort is normal.  Abdominal:     General: There is no distension.  Musculoskeletal:        General: Normal range of motion.     Cervical back: Normal range of motion.  Neurological:     Mental Status: She is alert and oriented to person, place, and time.  Psychiatric:        Mood and Affect: Mood and affect normal.      UC Treatments / Results  Labs (all labs ordered are listed, but only abnormal results are displayed) Labs Reviewed - No data to display  EKG   Radiology No results found.  Procedures Procedures (including critical care time)  Medications Ordered in UC Medications - No data to display  Initial Impression / Assessment and Plan / UC Course  I have reviewed the triage vital signs and the nursing notes.  Pertinent labs & imaging results that were available during my care of the patient were reviewed by me and considered in my medical decision making (see chart for details).     MDM:  Pt given rx for acyclovir Final Clinical Impressions(s) / UC Diagnoses   Final diagnoses:  Oral ulcer     Discharge Instructions     Return if any problems.    ED Prescriptions    Medication Sig Dispense Auth. Provider   acyclovir (ZOVIRAX) 200 MG capsule Take 1 capsule (200 mg total) by mouth 4 (four) times daily for 5 days. 20 capsule Elson Areas, [place]     PDMP not reviewed this encounter.  An After Visit Summary was printed and given to the patient.    Elson Areas, PA-C 06/12/20 1031    Elson Areas, [place] 06/15/20 [phone]

## 2021-03-12 ENCOUNTER — Emergency Department
Admission: EM | Admit: 2021-03-12 | Discharge: 2021-03-13 | Disposition: A | Discharge status: Home / Self Care | Payer: Medicaid Other | Attending: Emergency Medicine | Admitting: Emergency Medicine

## 2021-03-12 DIAGNOSIS — R112 Nausea with vomiting, unspecified: Secondary | ICD-10-CM | POA: Insufficient documentation

## 2021-03-12 DIAGNOSIS — K5900 Constipation, unspecified: Secondary | ICD-10-CM | POA: Insufficient documentation

## 2021-03-12 DIAGNOSIS — R1084 Generalized abdominal pain: Secondary | ICD-10-CM | POA: Insufficient documentation

## 2021-03-12 LAB — COMPREHENSIVE METABOLIC PANEL
ALT: 18 U/L (ref 0–44)
AST: 17 U/L (ref 15–41)
Albumin: 4.1 g/dL (ref 3.5–5.0)
Alkaline Phosphatase: 66 U/L (ref 38–126)
Anion gap: 5 (ref 5–15)
BUN: 7 mg/dL (ref 6–20)
CO2: 28 mmol/L (ref 22–32)
Calcium: 8.7 mg/dL — ABNORMAL LOW (ref 8.9–10.3)
Chloride: 102 mmol/L (ref 98–111)
Creatinine, Ser: 0.71 mg/dL (ref 0.44–1.00)
GFR, Estimated: >60 mL/min (ref >60)
Glucose, Bld: 89 mg/dL (ref 70–99)
Potassium: 4.1 mmol/L (ref 3.5–5.1)
Sodium: 135 mmol/L (ref 135–145)
Total Bilirubin: 0.5 mg/dL (ref 0.3–1.2)
Total Protein: 8.3 g/dL — ABNORMAL HIGH (ref 6.5–8.1)

## 2021-03-12 LAB — CBC
HCT: 46.3 % — ABNORMAL HIGH (ref 36.0–46.0)
Hemoglobin: 15.7 g/dL — ABNORMAL HIGH (ref 12.0–15.0)
MCH: 30 pg (ref 26.0–34.0)
MCHC: 33.9 g/dL (ref 30.0–36.0)
MCV: 88.5 fL (ref 80.0–100.0)
Platelets: 305 10*3/uL (ref 150–400)
RBC: 5.23 MIL/uL — ABNORMAL HIGH (ref 3.87–5.11)
RDW: 12.8 % (ref 11.5–15.5)
WBC: 9.9 10*3/uL (ref 4.0–10.5)
nRBC: 0 % (ref 0.0–0.2)

## 2021-03-12 LAB — LIPASE, BLOOD: Lipase: 46 U/L (ref 11–51)

## 2021-03-12 MED ORDER — MORPHINE SULFATE (PF) 4 MG/ML IV SOLN
4.0000 mg | Freq: Once | INTRAVENOUS | Status: AC
  Administered 2021-03-12: 4 mg via INTRAVENOUS
  Filled 2021-03-12: qty 1

## 2021-03-12 MED ORDER — SODIUM CHLORIDE 0.9 % IV BOLUS (SEPSIS)
1000.0000 mL | Freq: Once | INTRAVENOUS | Status: AC
  Administered 2021-03-12: 1000 mL via INTRAVENOUS

## 2021-03-12 MED ORDER — ONDANSETRON HCL 4 MG/2ML IJ SOLN
4.0000 mg | Freq: Once | INTRAMUSCULAR | Status: AC
  Administered 2021-03-12: 4 mg via INTRAVENOUS
  Filled 2021-03-12: qty 2

## 2021-03-12 NOTE — ED Provider Notes (Signed)
Emergency Medicine Provider Triage Evaluation Note  ARMIDA VICKROY, a 25 y.o. female  was evaluated in triage.  Pt complains of emesis nightly with associated nausea and generalized abdominal pain.  Patient reports symptoms for the last week and a half.  She denies any associated diarrhea, constipation, or fevers.  Denies any abdominal surgeries.  She denies any history of gastritis, colitis, or pancreatitis.  Patient denies any postprandial symptoms.  She does not endorse any aggravating or alleviating measures.  Review of Systems  Positive: NV, generalized abdominal pain  Negative: FCS  Physical Exam  BP 124/87 (BP Location: Left Arm)   Pulse 82   Resp 18   SpO2 98%  Gen:   Awake, no distress  NAD Resp:  Normal effort CTA MSK:   Moves extremities without difficulty  Other:  ABD: soft, nontender  Medical Decision Making  Medically screening exam initiated at 7:41 PM.  Appropriate orders placed.  ZYAIR RHEIN was informed that the remainder of the evaluation will be completed by another provider, this initial triage assessment does not replace that evaluation, and the importance of remaining in the ED until their evaluation is complete.  Patient ED evaluation of nighttime nausea and vomiting with generalized abdominal pain for the last week and a half.   Lissa Hoard, PA-C 03/12/21 1944    Concha Se, MD 03/12/21 1958

## 2021-03-12 NOTE — ED Triage Notes (Signed)
Pt reports emesis nightly for a week and a half. Denies diarrhea, constipated times a week and a half.

## 2021-03-12 NOTE — ED Provider Notes (Signed)
[facility] Emergency Department Provider Note  ____________________________________________   Event Date/Time   First MD Initiated Contact with Patient 03/12/21 2302     (approximate)  I have reviewed the triage vital signs and the nursing notes.   HISTORY  Chief Complaint Emesis, Abdominal Pain, and Constipation    HPI CINDEE MCLESTER is a 25 y.o. female with history of obesity, anxiety who presents to the emergency department with complaints of generalized abdominal pain, nausea and vomiting for the past week and a half.  She has had a previous C-section.  Denies any diarrhea, dysuria, hematuria, vaginal bleeding or discharge.  No recent travel or sick contacts.        Past Medical History:  Diagnosis Date   Anxiety 10/22/2017   Chlamydia infection during pregnancy 02/03/2017   Deliberate self-cutting 10/22/2017   Age [age]   Morbid obesity (HCC) 06/01/2017   Postpartum depression 10/22/2017    Patient Active Problem List   Diagnosis Date Noted   Postpartum depression 10/22/2017   Anxiety 10/22/2017   Deliberate self-cutting 10/22/2017   Morbid obesity (HCC) 06/01/2017    Past Surgical History:  Procedure Laterality Date   CESAREAN SECTION N/A 08/02/2017   Procedure: CESAREAN SECTION;  Surgeon: Hildred Laser, MD;  Location: [facility] ORS;  Service: Obstetrics;  Laterality: N/A;   TONSILLECTOMY      Prior to Admission medications   Medication Sig Start Date End Date Taking? Authorizing Provider  dicyclomine (BENTYL) 20 MG tablet Take 1 tablet (20 mg total) by mouth every 8 (eight) hours as needed for spasms (Abdominal cramping). 03/13/21  Yes Theda Payer N, DO  ondansetron (ZOFRAN-ODT) 4 MG disintegrating tablet Take 1 tablet (4 mg total) by mouth every 8 (eight) hours as needed for nausea or vomiting. 03/13/21  Yes Boleslaw Borghi, Layla Maw, DO  benzonatate (TESSALON) 100 MG capsule Take 1 capsule (100 mg total) by mouth 3 (three) times daily as needed  for cough. 05/07/20   Avegno, Zachery Dakins, FNP  cetirizine (ZYRTEC ALLERGY) 10 MG tablet Take 1 tablet (10 mg total) by mouth daily. 05/07/20   Avegno, Zachery Dakins, FNP  hydrOXYzine (ATARAX/VISTARIL) 50 MG tablet Take 1 tablet (50 mg total) by mouth 3 (three) times daily as needed. 12/14/19   Avegno, Zachery Dakins, FNP  lidocaine (XYLOCAINE) 2 % solution Use as directed 15 mLs in the mouth or throat as needed for mouth pain. 10/10/19   Avegno, Zachery Dakins, FNP  phenazopyridine (PYRIDIUM) 200 MG tablet Take 1 tablet (200 mg total) by mouth 3 (three) times daily. 06/13/19   Wurst, [place], PA-C  sertraline (ZOLOFT) 50 MG tablet Take 1 tablet (50 mg total) by mouth daily. Patient not taking: Reported on 12/08/2018 10/21/17   Hildred Laser, MD    Allergies Amoxicillin  Family History  Problem Relation Age of Onset   Diabetes Mother    Thyroid disease Mother    Cirrhosis Mother    Healthy Father     Social History Social History   Tobacco Use   Smoking status: Never   Smokeless tobacco: Never  Vaping Use   Vaping Use: Never used  Substance Use Topics   Alcohol use: No   Drug use: No    Review of Systems Constitutional: No fever. Eyes: No visual changes. ENT: No sore throat. Cardiovascular: Denies chest pain. Respiratory: Denies shortness of breath. Gastrointestinal: + nausea, vomiting.  No diarrhea. Genitourinary: Negative for dysuria. Musculoskeletal: Negative for back pain. Skin: Negative for rash. Neurological: Negative  for focal weakness or numbness.  ____________________________________________   PHYSICAL EXAM:  VITAL SIGNS: ED Triage Vitals  Enc Vitals Group     BP 03/12/21 1940 124/87     Pulse Rate 03/12/21 1940 82     Resp 03/12/21 1940 18     Temp 03/12/21 1942 98.4 F (36.9 C)     Temp Source 03/12/21 1942 Oral     SpO2 03/12/21 1940 98 %     Weight --      Height --      Head Circumference --      Peak Flow --      Pain Score 03/12/21 1940 0     Pain Loc --       Pain Edu? --      Excl. in [place]? --    CONSTITUTIONAL: Alert and oriented and responds appropriately to questions. Well-appearing; well-nourished HEAD: Normocephalic EYES: Conjunctivae clear, pupils appear equal, EOM appear intact ENT: normal nose; moist mucous membranes NECK: Supple, normal ROM CARD: RRR; S1 and S2 appreciated; no murmurs, no clicks, no rubs, no gallops RESP: Normal chest excursion without splinting or tachypnea; breath sounds clear and equal bilaterally; no wheezes, no rhonchi, no rales, no hypoxia or respiratory distress, speaking full sentences ABD/GI: Normal bowel sounds; non-distended; soft, mildly tender to palpation diffusely without guarding or rebound BACK: The back appears normal EXT: Normal ROM in all joints; no deformity noted, no edema; no cyanosis SKIN: Normal color for age and race; warm; no rash on exposed skin NEURO: Moves all extremities equally PSYCH: The patient's mood and manner are appropriate.  ____________________________________________   LABS (all labs ordered are listed, but only abnormal results are displayed)  Labs Reviewed  COMPREHENSIVE METABOLIC PANEL - Abnormal; Notable for the following components:      Result Value   Calcium 8.7 (*)    Total Protein 8.3 (*)    All other components within normal limits  CBC - Abnormal; Notable for the following components:   RBC 5.23 (*)    Hemoglobin 15.7 (*)    HCT 46.3 (*)    All other components within normal limits  LIPASE, BLOOD  URINALYSIS, ROUTINE W REFLEX MICROSCOPIC  POC URINE PREG, ED   ____________________________________________  EKG   ____________________________________________  RADIOLOGY I, Johnny Gorter, personally viewed and evaluated these images (plain radiographs) as part of my medical decision making, as well as reviewing the written report by the radiologist.  ED MD interpretation: No acute abnormality seen on CT of the abdomen pelvis.  Official radiology  report(s): CT ABDOMEN PELVIS W CONTRAST  Result Date: 03/13/2021 CLINICAL DATA:  Abdominal pain. EXAM: CT ABDOMEN AND PELVIS WITH CONTRAST TECHNIQUE: Multidetector CT imaging of the abdomen and pelvis was performed using the standard protocol following bolus administration of intravenous contrast. CONTRAST:  157mL OMNIPAQUE IOHEXOL 300 MG/ML  SOLN COMPARISON:  None. FINDINGS: Lower chest: The visualized lung bases are clear. No intra-abdominal free air or free fluid. Hepatobiliary: Mild fatty liver. No intrahepatic biliary dilatation. The gallbladder is unremarkable. Pancreas: Unremarkable. No pancreatic ductal dilatation or surrounding inflammatory changes. Spleen: Normal in size without focal abnormality. Adrenals/Urinary Tract: Adrenal glands are unremarkable. Kidneys are normal, without renal calculi, focal lesion, or hydronephrosis. Bladder is unremarkable. Stomach/Bowel: There is no bowel obstruction or active inflammation. The appendix is normal. Vascular/Lymphatic: The abdominal aorta and IVC unremarkable. No portal venous gas. There is no adenopathy. Reproductive: The uterus is anteverted. An intrauterine device is noted. No adnexal masses. Other: None  Musculoskeletal: No acute or significant osseous findings. IMPRESSION: 1. No acute intra-abdominal or pelvic pathology. 2. Mild fatty liver. Electronically Signed   By: Anner Crete M.D.   On: 03/13/2021 01:06    ____________________________________________   PROCEDURES  Procedure(s) performed (including Critical Care):  Procedures    ____________________________________________   INITIAL IMPRESSION / ASSESSMENT AND PLAN / ED COURSE  As part of my medical decision making, I reviewed the following data within the [place] History obtained from family, Nursing notes reviewed and incorporated, Labs reviewed , Old chart reviewed, CT reviewed, and Notes from prior ED visits         Patient here with a week and a  half of nausea and vomiting and generalized abdominal pain.  Differential includes viral illness, gastritis, cholecystitis, pancreatitis, appendicitis, colitis, diverticulitis, UTI, kidney stone, pyelonephritis, bowel obstruction.  Labs today are reassuring.  Will obtain urinalysis, CT of abdomen pelvis.  Will give IV fluids, pain and nausea medicine.  ED PROGRESS  Patient's urine shows no sign of infection or dehydration.  Pregnancy test is negative.  CT of the abdomen pelvis shows no acute abnormality.  No appendicitis.  No bowel obstruction.  No colitis.  Will p.o. challenge but anticipate discharge home with prescriptions for Zofran, Bentyl.  Given outpatient PCP follow-up if symptoms continue.  She is otherwise well-appearing here.  Patient and family comfortable with this plan.   At this time, I do not feel there is any life-threatening condition present. I have reviewed, interpreted and discussed all results (EKG, imaging, lab, urine as appropriate) and exam findings with patient/family. I have reviewed nursing notes and appropriate previous records.  I feel the patient is safe to be discharged home without further emergent workup and can continue workup as an outpatient as needed. Discussed usual and customary return precautions. Patient/family verbalize understanding and are comfortable with this plan.  Outpatient follow-up has been provided as needed. All questions have been answered.  ____________________________________________   FINAL CLINICAL IMPRESSION(S) / ED DIAGNOSES  Final diagnoses:  Nausea and vomiting in adult     ED Discharge Orders          Ordered    ondansetron (ZOFRAN-ODT) 4 MG disintegrating tablet  Every 8 hours PRN        03/13/21 0123    dicyclomine (BENTYL) 20 MG tablet  Every 8 hours PRN        03/13/21 0123            *Please note:  KISMET REEDER was evaluated in Emergency Department on 03/13/2021 for the symptoms described in the history of  present illness. She was evaluated in the context of the global COVID-19 pandemic, which necessitated consideration that the patient might be at risk for infection with the SARS-CoV-2 virus that causes COVID-19. Institutional protocols and algorithms that pertain to the evaluation of patients at risk for COVID-19 are in a state of rapid change based on information released by regulatory bodies including the CDC and federal and state organizations. These policies and algorithms were followed during the patient's care in the ED.  Some ED evaluations and interventions may be delayed as a result of limited staffing during and the pandemic.*   Note:  This document was prepared using Dragon voice recognition software and may include unintentional dictation errors.    Saulo Anthis, Delice Bison, DO 03/13/21 0126

## 2021-03-13 ENCOUNTER — Emergency Department: Payer: Medicaid Other

## 2021-03-13 LAB — URINALYSIS, ROUTINE W REFLEX MICROSCOPIC
Bilirubin Urine: NEGATIVE
Glucose, UA: NEGATIVE mg/dL
Hgb urine dipstick: NEGATIVE
Ketones, ur: NEGATIVE mg/dL
Leukocytes,Ua: NEGATIVE
Nitrite: NEGATIVE
Protein, ur: NEGATIVE mg/dL
Specific Gravity, Urine: 1.02 (ref 1.005–1.030)
pH: 7.5 (ref 5.0–8.0)

## 2021-03-13 LAB — POC URINE PREG, ED: Preg Test, Ur: NEGATIVE

## 2021-03-13 MED ORDER — ONDANSETRON 4 MG PO TBDP
4.0000 mg | ORAL_TABLET | Freq: Three times a day (TID) | ORAL | 0 refills | Status: DC | PRN
Start: 2021-03-13 — End: 2021-11-29

## 2021-03-13 MED ORDER — IOHEXOL 300 MG/ML  SOLN
100.0000 mL | Freq: Once | INTRAMUSCULAR | Status: AC | PRN
  Administered 2021-03-13: 100 mL via INTRAVENOUS

## 2021-03-13 MED ORDER — DICYCLOMINE HCL 20 MG PO TABS: 20.0000 mg | ORAL_TABLET | Freq: Three times a day (TID) | ORAL | 0 refills | Status: DC | PRN

## 2021-03-13 NOTE — Discharge Instructions (Signed)
Steps to find a Primary Care Provider (PCP):  Call [phone] or [phone] to access "[facility] Find a Doctor Service."  2.  You may also go on the [facility] website at [email or web address]  

## 2021-05-11 ENCOUNTER — Other Ambulatory Visit: Payer: Self-pay

## 2021-05-11 ENCOUNTER — Ambulatory Visit
Admission: EM | Admit: 2021-05-11 | Discharge: 2021-05-11 | Disposition: A | Discharge status: Home / Self Care | Payer: Medicaid Other | Attending: Family Medicine | Admitting: Family Medicine

## 2021-05-11 ENCOUNTER — Encounter: Payer: Self-pay | Admitting: Emergency Medicine

## 2021-05-11 DIAGNOSIS — N390 Urinary tract infection, site not specified: Secondary | ICD-10-CM | POA: Insufficient documentation

## 2021-05-11 LAB — POCT URINALYSIS DIP (MANUAL ENTRY)
Bilirubin, UA: NEGATIVE
Glucose, UA: NEGATIVE mg/dL
Ketones, POC UA: NEGATIVE mg/dL
Nitrite, UA: NEGATIVE
Protein Ur, POC: >=300 mg/dL — AB
Spec Grav, UA: 1.025 (ref 1.010–1.025)
Urobilinogen, UA: 0.2 E.U./dL
pH, UA: 6 (ref 5.0–8.0)

## 2021-05-11 LAB — POCT URINE PREGNANCY: Preg Test, Ur: NEGATIVE

## 2021-05-11 MED ORDER — PHENAZOPYRIDINE HCL 200 MG PO TABS: 200.0000 mg | ORAL_TABLET | Freq: Three times a day (TID) | ORAL | 0 refills | Status: DC

## 2021-05-11 MED ORDER — NITROFURANTOIN MONOHYD MACRO 100 MG PO CAPS: 100.0000 mg | ORAL_CAPSULE | Freq: Two times a day (BID) | ORAL | 0 refills | Status: DC

## 2021-05-11 NOTE — ED Provider Notes (Signed)
[facility]    CSN: [id number] Arrival date & time: 05/11/21  1441      History   Chief Complaint Chief Complaint  Patient presents with   Dysuria   Abdominal Pain    HPI Casey Fuentes is a 26 y.o. female.   Presenting today with 2-day history of dysuria, suprapubic abdominal pain, nausea, hematuria.  Denies fever, chills, nausea, vomiting, bowel changes, vaginal symptoms, concern for pregnancy or STDs.  Has an IUD in place.  Took [place] with no relief of symptoms.  History of UTIs that have felt similar.   Past Medical History:  Diagnosis Date   Anxiety 10/22/2017   Chlamydia infection during pregnancy 02/03/2017   Deliberate self-cutting 10/22/2017   Age 47-18   Morbid obesity ([place]) 06/01/2017   Postpartum depression 10/22/2017    Patient Active Problem List   Diagnosis Date Noted   Postpartum depression 10/22/2017   Anxiety 10/22/2017   Deliberate self-cutting 10/22/2017   Morbid obesity ([place]) 06/01/2017    Past Surgical History:  Procedure Laterality Date   CESAREAN SECTION N/A 08/02/2017   Procedure: CESAREAN SECTION;  Surgeon: Rubie Maid, MD;  Location: [facility] ORS;  Service: Obstetrics;  Laterality: N/A;   TONSILLECTOMY      OB History     Gravida  1   Para  1   Term  1   Preterm      AB      Living  1      SAB      IAB      Ectopic      Multiple  0   Live Births  1            Home Medications    Prior to Admission medications   Medication Sig Start Date End Date Taking? Authorizing Provider  nitrofurantoin, macrocrystal-monohydrate, (MACROBID) 100 MG capsule Take 1 capsule (100 mg total) by mouth 2 (two) times daily. 05/11/21  Yes Volney American, PA-C  phenazopyridine (PYRIDIUM) 200 MG tablet Take 1 tablet (200 mg total) by mouth 3 (three) times daily. 05/11/21  Yes Volney American, PA-C  benzonatate (TESSALON) 100 MG capsule Take 1 capsule (100 mg total) by mouth 3 (three) times daily as needed for cough.  05/07/20   Avegno, Darrelyn Hillock, FNP  cetirizine (ZYRTEC ALLERGY) 10 MG tablet Take 1 tablet (10 mg total) by mouth daily. 05/07/20   Avegno, Darrelyn Hillock, FNP  dicyclomine (BENTYL) 20 MG tablet Take 1 tablet (20 mg total) by mouth every 8 (eight) hours as needed for spasms (Abdominal cramping). 03/13/21   Ward, Delice Bison, DO  hydrOXYzine (ATARAX/VISTARIL) 50 MG tablet Take 1 tablet (50 mg total) by mouth 3 (three) times daily as needed. 12/14/19   Avegno, Darrelyn Hillock, FNP  lidocaine (XYLOCAINE) 2 % solution Use as directed 15 mLs in the mouth or throat as needed for mouth pain. 10/10/19   Avegno, Darrelyn Hillock, FNP  ondansetron (ZOFRAN-ODT) 4 MG disintegrating tablet Take 1 tablet (4 mg total) by mouth every 8 (eight) hours as needed for nausea or vomiting. 03/13/21   Ward, Delice Bison, DO  phenazopyridine (PYRIDIUM) 200 MG tablet Take 1 tablet (200 mg total) by mouth 3 (three) times daily. 06/13/19   Wurst, [place], PA-C  sertraline (ZOLOFT) 50 MG tablet Take 1 tablet (50 mg total) by mouth daily. Patient not taking: Reported on 12/08/2018 10/21/17   Rubie Maid, MD    Family History Family History  Problem Relation Age of  Onset   Diabetes Mother    Thyroid disease Mother    Cirrhosis Mother    Healthy Father     Social History Social History   Tobacco Use   Smoking status: Never   Smokeless tobacco: Never  Vaping Use   Vaping Use: Never used  Substance Use Topics   Alcohol use: No   Drug use: No     Allergies   Amoxicillin   Review of Systems Review of Systems Per HPI  Physical Exam Triage Vital Signs ED Triage Vitals  Enc Vitals Group     BP 05/11/21 1511 112/70     Pulse Rate 05/11/21 1511 86     Resp 05/11/21 1511 16     Temp 05/11/21 1511 99 F (37.2 C)     Temp Source 05/11/21 1511 Oral     SpO2 05/11/21 1511 97 %     Weight --      Height --      Head Circumference --      Peak Flow --      Pain Score 05/11/21 1514 4     Pain Loc --      Pain Edu? --      Excl.  in [place]? --    No data found.  Updated Vital Signs BP 112/70 (BP Location: Right Arm)    Pulse 86    Temp 99 F (37.2 C) (Oral)    Resp 16    LMP  (LMP Unknown)    SpO2 97%   Visual Acuity Right Eye Distance:   Left Eye Distance:   Bilateral Distance:    Right Eye Near:   Left Eye Near:    Bilateral Near:     Physical Exam Vitals and nursing note reviewed.  Constitutional:      Appearance: Normal appearance. She is not ill-appearing.  HENT:     Head: Atraumatic.  Eyes:     Extraocular Movements: Extraocular movements intact.     Conjunctiva/sclera: Conjunctivae normal.  Cardiovascular:     Rate and Rhythm: Normal rate and regular rhythm.     Heart sounds: Normal heart sounds.  Pulmonary:     Effort: Pulmonary effort is normal.     Breath sounds: Normal breath sounds.  Abdominal:     General: Bowel sounds are normal. There is no distension.     Palpations: Abdomen is soft.     Tenderness: There is no abdominal tenderness. There is no right CVA tenderness, left CVA tenderness or guarding.  Musculoskeletal:        General: Normal range of motion.     Cervical back: Normal range of motion and neck supple.  Skin:    General: Skin is warm and dry.  Neurological:     Mental Status: She is alert and oriented to person, place, and time.  Psychiatric:        Mood and Affect: Mood normal.        Thought Content: Thought content normal.        Judgment: Judgment normal.     UC Treatments / Results  Labs (all labs ordered are listed, but only abnormal results are displayed) Labs Reviewed  POCT URINALYSIS DIP (MANUAL ENTRY) - Abnormal; Notable for the following components:      Result Value   Clarity, UA cloudy (*)    Blood, UA large (*)    Protein Ur, POC >=300 (*)    Leukocytes, UA Large (3+) (*)    All  other components within normal limits  URINE CULTURE  POCT URINE PREGNANCY    EKG   Radiology No results found.  Procedures Procedures (including critical  care time)  Medications Ordered in UC Medications - No data to display  Initial Impression / Assessment and Plan / UC Course  I have reviewed the triage vital signs and the nursing notes.  Pertinent labs & imaging results that were available during my care of the patient were reviewed by me and considered in my medical decision making (see chart for details).     Vital signs and exam reassuring today, UA showing evidence of a urinary tract infection.  Urine culture pending, treat with Macrobid, Pyridium in the meantime.  Discussed to push fluids, void fully and frequently.  Return for acutely worsening symptoms  Final Clinical Impressions(s) / UC Diagnoses   Final diagnoses:  Acute lower UTI   Discharge Instructions   None    ED Prescriptions     Medication Sig Dispense Auth. Provider   nitrofurantoin, macrocrystal-monohydrate, (MACROBID) 100 MG capsule Take 1 capsule (100 mg total) by mouth 2 (two) times daily. 10 capsule Volney American, [place]   phenazopyridine (PYRIDIUM) 200 MG tablet Take 1 tablet (200 mg total) by mouth 3 (three) times daily. 6 tablet Volney American, [place]      PDMP not reviewed this encounter.   Volney American, [place] 05/11/21 1535

## 2021-05-11 NOTE — ED Triage Notes (Signed)
Patient c/o dysuria and ABD pain x 2 days.   Patient denies fever or vomiting. Patient denies any vaginal discharge.   Patient endorses nausea.   Patient endorses hematuria.   Patient endorses RT sided back pain.   Patient took [place] with no relief of symptoms.

## 2021-05-14 LAB — URINE CULTURE: Culture: >=100000 — AB

## 2021-08-06 ENCOUNTER — Ambulatory Visit
Admission: EM | Admit: 2021-08-06 | Discharge: 2021-08-06 | Disposition: A | Discharge status: Home / Self Care | Payer: Medicaid Other | Attending: Urgent Care | Admitting: Urgent Care

## 2021-08-06 ENCOUNTER — Encounter: Payer: Self-pay | Admitting: Emergency Medicine

## 2021-08-06 DIAGNOSIS — J069 Acute upper respiratory infection, unspecified: Secondary | ICD-10-CM | POA: Diagnosis not present

## 2021-08-06 DIAGNOSIS — R070 Pain in throat: Secondary | ICD-10-CM | POA: Insufficient documentation

## 2021-08-06 DIAGNOSIS — R052 Subacute cough: Secondary | ICD-10-CM | POA: Insufficient documentation

## 2021-08-06 LAB — POCT RAPID STREP A (OFFICE): Rapid Strep A Screen: NEGATIVE

## 2021-08-06 MED ORDER — CETIRIZINE HCL 10 MG PO TABS: 10.0000 mg | ORAL_TABLET | Freq: Every day | ORAL | 0 refills | Status: DC

## 2021-08-06 MED ORDER — PSEUDOEPHEDRINE HCL 60 MG PO TABS: 60.0000 mg | ORAL_TABLET | Freq: Three times a day (TID) | ORAL | 0 refills | Status: DC | PRN

## 2021-08-06 MED ORDER — CHLORASEPTIC 1.4 % MT LIQD: 1.0000 | Freq: Three times a day (TID) | OROMUCOSAL | 0 refills | Status: DC | PRN

## 2021-08-06 NOTE — ED Provider Notes (Signed)
?[facility] ? ? ?MRN: [id number] DOB: 08-20-1995 ? ?Subjective:  ? ?Casey Fuentes is a 26 y.o. female presenting for 2 day history of acute onset throat pain, painful swallowing, body aches. Has also had a mild cough and slight shob. No chest pain, wheezing.  No history of asthma.  Patient is not a smoker.  Has no concerns about STIs.  No strep exposure. ? ?No current facility-administered medications for this encounter. ? ?Current Outpatient Medications:  ?  benzonatate (TESSALON) 100 MG capsule, Take 1 capsule (100 mg total) by mouth 3 (three) times daily as needed for cough., Disp: 30 capsule, Rfl: 0 ?  cetirizine (ZYRTEC ALLERGY) 10 MG tablet, Take 1 tablet (10 mg total) by mouth daily., Disp: 30 tablet, Rfl: 0 ?  dicyclomine (BENTYL) 20 MG tablet, Take 1 tablet (20 mg total) by mouth every 8 (eight) hours as needed for spasms (Abdominal cramping)., Disp: 15 tablet, Rfl: 0 ?  hydrOXYzine (ATARAX/VISTARIL) 50 MG tablet, Take 1 tablet (50 mg total) by mouth 3 (three) times daily as needed., Disp: 30 tablet, Rfl: 0 ?  lidocaine (XYLOCAINE) 2 % solution, Use as directed 15 mLs in the mouth or throat as needed for mouth pain., Disp: 100 mL, Rfl: 0 ?  nitrofurantoin, macrocrystal-monohydrate, (MACROBID) 100 MG capsule, Take 1 capsule (100 mg total) by mouth 2 (two) times daily., Disp: 10 capsule, Rfl: 0 ?  ondansetron (ZOFRAN-ODT) 4 MG disintegrating tablet, Take 1 tablet (4 mg total) by mouth every 8 (eight) hours as needed for nausea or vomiting., Disp: 20 tablet, Rfl: 0 ?  phenazopyridine (PYRIDIUM) 200 MG tablet, Take 1 tablet (200 mg total) by mouth 3 (three) times daily., Disp: 6 tablet, Rfl: 0 ?  phenazopyridine (PYRIDIUM) 200 MG tablet, Take 1 tablet (200 mg total) by mouth 3 (three) times daily., Disp: 6 tablet, Rfl: 0 ?  sertraline (ZOLOFT) 50 MG tablet, Take 1 tablet (50 mg total) by mouth daily. (Patient not taking: Reported on 12/08/2018), Disp: 30 tablet, Rfl: 3  ? ?Allergies   ?Allergen Reactions  ? Amoxicillin   ? ? ?Past Medical History:  ?Diagnosis Date  ? Anxiety 10/22/2017  ? Chlamydia infection during pregnancy 02/03/2017  ? Deliberate self-cutting 10/22/2017  ? Age 34-18  ? Morbid obesity (HCC) 06/01/2017  ? Postpartum depression 10/22/2017  ?  ? ?Past Surgical History:  ?Procedure Laterality Date  ? CESAREAN SECTION N/A 08/02/2017  ? Procedure: CESAREAN SECTION;  Surgeon: Hildred Laser, MD;  Location: [facility] ORS;  Service: Obstetrics;  Laterality: N/A;  ? TONSILLECTOMY    ? ? ?Family History  ?Problem Relation Age of Onset  ? Diabetes Mother   ? Thyroid disease Mother   ? Cirrhosis Mother   ? Healthy Father   ? ? ?Social History  ? ?Tobacco Use  ? Smoking status: Never  ? Smokeless tobacco: Never  ?Vaping Use  ? Vaping Use: Never used  ?Substance Use Topics  ? Alcohol use: No  ? Drug use: No  ? ? ?ROS ? ? ?Objective:  ? ?Vitals: ?BP 115/80 (BP Location: Right Arm)   Pulse 94   Temp 98.8 ?F (37.1 ?C) (Oral)   Resp 18   SpO2 97%  ? ?Physical Exam ?Constitutional:   ?   General: She is not in acute distress. ?   Appearance: Normal appearance. She is well-developed and normal weight. She is not ill-appearing, toxic-appearing or diaphoretic.  ?HENT:  ?   Head: Normocephalic and atraumatic.  ?   Right  Ear: Tympanic membrane, ear canal and external ear normal. No drainage or tenderness. No middle ear effusion. There is no impacted cerumen. Tympanic membrane is not erythematous.  ?   Left Ear: Tympanic membrane, ear canal and external ear normal. No drainage or tenderness.  No middle ear effusion. There is no impacted cerumen. Tympanic membrane is not erythematous.  ?   Nose: Nose normal. No congestion or rhinorrhea.  ?   Mouth/Throat:  ?   Mouth: Mucous membranes are moist. No oral lesions.  ?   Pharynx: Posterior oropharyngeal erythema (with associated post-nasal drainage) present. No pharyngeal swelling, oropharyngeal exudate or uvula swelling.  ?   Tonsils: No tonsillar exudate or  tonsillar abscesses.  ?Eyes:  ?   General: No scleral icterus.    ?   Right eye: No discharge.     ?   Left eye: No discharge.  ?   Extraocular Movements: Extraocular movements intact.  ?   Right eye: Normal extraocular motion.  ?   Left eye: Normal extraocular motion.  ?   Conjunctiva/sclera: Conjunctivae normal.  ?Cardiovascular:  ?   Rate and Rhythm: Normal rate.  ?   Heart sounds: No murmur heard. ?  No friction rub. No gallop.  ?Pulmonary:  ?   Effort: Pulmonary effort is normal. No respiratory distress.  ?   Breath sounds: No stridor. No wheezing, rhonchi or rales.  ?Chest:  ?   Chest wall: No tenderness.  ?Musculoskeletal:  ?   Cervical back: Normal range of motion and neck supple.  ?Lymphadenopathy:  ?   Cervical: No cervical adenopathy.  ?Skin: ?   General: Skin is warm and dry.  ?Neurological:  ?   General: No focal deficit present.  ?   Mental Status: She is alert and oriented to person, place, and time.  ?Psychiatric:     ?   Mood and Affect: Mood normal.     ?   Behavior: Behavior normal.  ? ? ?Results for orders placed or performed during the hospital encounter of 08/06/21 (from the past 24 hour(s))  ?POCT rapid strep A     Status: None  ? Collection Time: 08/06/21  6:30 PM  ?Result Value Ref Range  ? Rapid Strep A Screen Negative Negative  ? ? ?Assessment and Plan :  ? ?PDMP not reviewed this encounter. ? ?1. Viral upper respiratory infection   ?2. Throat pain   ?3. Subacute cough   ? ?  ?Strep culture pending. Suspect viral URI, viral syndrome. Physical exam findings reassuring and vital signs stable for discharge. Advised supportive care, offered symptomatic relief. Deferred imaging given clear cardiopulmonary exam, hemodynamically stable vital signs. Low [place] criteria, low suspicion for PE. Counseled patient on potential for adverse effects with medications prescribed/recommended today, ER and return-to-clinic precautions discussed, patient verbalized understanding.  ? ?  ?Wallis Bamberg,  PA-C ?08/06/21 1844 ? ?

## 2021-08-06 NOTE — ED Triage Notes (Signed)
Sore throat, body aches, since Tuesday.   ?

## 2021-08-09 LAB — CULTURE, GROUP A STREP (THRC)

## 2021-11-28 ENCOUNTER — Emergency Department (HOSPITAL_COMMUNITY)
Admission: EM | Admit: 2021-11-28 | Discharge: 2021-11-28 | Disposition: A | Discharge status: Other Acute Inpatient Hospital | Payer: Managed Care, Other (non HMO) | Attending: Emergency Medicine | Admitting: Emergency Medicine

## 2021-11-28 ENCOUNTER — Ambulatory Visit (HOSPITAL_COMMUNITY)
Admission: EM | Admit: 2021-11-28 | Discharge: 2021-11-29 | Disposition: A | Discharge status: Other Acute Inpatient Hospital | Payer: Managed Care, Other (non HMO) | Source: Home / Self Care

## 2021-11-28 ENCOUNTER — Encounter (HOSPITAL_COMMUNITY): Payer: Self-pay | Admitting: *Deleted

## 2021-11-28 ENCOUNTER — Other Ambulatory Visit: Payer: Self-pay

## 2021-11-28 DIAGNOSIS — Z20822 Contact with and (suspected) exposure to covid-19: Secondary | ICD-10-CM | POA: Insufficient documentation

## 2021-11-28 DIAGNOSIS — F419 Anxiety disorder, unspecified: Secondary | ICD-10-CM

## 2021-11-28 DIAGNOSIS — Z9151 Personal history of suicidal behavior: Secondary | ICD-10-CM | POA: Insufficient documentation

## 2021-11-28 DIAGNOSIS — F1290 Cannabis use, unspecified, uncomplicated: Secondary | ICD-10-CM | POA: Insufficient documentation

## 2021-11-28 DIAGNOSIS — F332 Major depressive disorder, recurrent severe without psychotic features: Secondary | ICD-10-CM | POA: Insufficient documentation

## 2021-11-28 DIAGNOSIS — F32A Depression, unspecified: Secondary | ICD-10-CM

## 2021-11-28 DIAGNOSIS — R45851 Suicidal ideations: Secondary | ICD-10-CM | POA: Insufficient documentation

## 2021-11-28 DIAGNOSIS — R419 Unspecified symptoms and signs involving cognitive functions and awareness: Secondary | ICD-10-CM | POA: Insufficient documentation

## 2021-11-28 LAB — SALICYLATE LEVEL: Salicylate Lvl: <7 mg/dL — ABNORMAL LOW (ref 7.0–30.0)

## 2021-11-28 LAB — COMPREHENSIVE METABOLIC PANEL
ALT: 18 U/L (ref 0–44)
AST: 18 U/L (ref 15–41)
Albumin: 3.8 g/dL (ref 3.5–5.0)
Alkaline Phosphatase: 62 U/L (ref 38–126)
Anion gap: 7 (ref 5–15)
BUN: 7 mg/dL (ref 6–20)
CO2: 24 mmol/L (ref 22–32)
Calcium: 9 mg/dL (ref 8.9–10.3)
Chloride: 108 mmol/L (ref 98–111)
Creatinine, Ser: 0.94 mg/dL (ref 0.44–1.00)
GFR, Estimated: >60 mL/min (ref >60)
Glucose, Bld: 97 mg/dL (ref 70–99)
Potassium: 4.2 mmol/L (ref 3.5–5.1)
Sodium: 139 mmol/L (ref 135–145)
Total Bilirubin: 0.5 mg/dL (ref 0.3–1.2)
Total Protein: 7.4 g/dL (ref 6.5–8.1)

## 2021-11-28 LAB — CBC
HCT: 45.3 % (ref 36.0–46.0)
Hemoglobin: 15.3 g/dL — ABNORMAL HIGH (ref 12.0–15.0)
MCH: 30.8 pg (ref 26.0–34.0)
MCHC: 33.8 g/dL (ref 30.0–36.0)
MCV: 91.1 fL (ref 80.0–100.0)
Platelets: 285 10*3/uL (ref 150–400)
RBC: 4.97 MIL/uL (ref 3.87–5.11)
RDW: 13.2 % (ref 11.5–15.5)
WBC: 13.3 10*3/uL — ABNORMAL HIGH (ref 4.0–10.5)
nRBC: 0 % (ref 0.0–0.2)

## 2021-11-28 LAB — RAPID URINE DRUG SCREEN, HOSP PERFORMED
Amphetamines: NOT DETECTED
Barbiturates: NOT DETECTED
Benzodiazepines: NOT DETECTED
Cocaine: NOT DETECTED
Opiates: NOT DETECTED
Tetrahydrocannabinol: POSITIVE — AB

## 2021-11-28 LAB — I-STAT BETA HCG BLOOD, ED (MC, WL, AP ONLY): I-stat hCG, quantitative: <5 m[IU]/mL (ref <5)

## 2021-11-28 LAB — ETHANOL: Alcohol, Ethyl (B): <10 mg/dL (ref <10)

## 2021-11-28 LAB — RESP PANEL BY RT-PCR (FLU A&B, COVID) ARPGX2
Influenza A by PCR: NEGATIVE
Influenza B by PCR: NEGATIVE
SARS Coronavirus 2 by RT PCR: NEGATIVE

## 2021-11-28 LAB — ACETAMINOPHEN LEVEL: Acetaminophen (Tylenol), Serum: <10 ug/mL — ABNORMAL LOW (ref 10–30)

## 2021-11-28 MED ORDER — HYDROXYZINE HCL 25 MG PO TABS
25.0000 mg | ORAL_TABLET | Freq: Three times a day (TID) | ORAL | Status: DC | PRN
  Administered 2021-11-28: 25 mg via ORAL
  Filled 2021-11-28: qty 1

## 2021-11-28 MED ORDER — HYDROXYZINE HCL 25 MG PO TABS: 25.0000 mg | ORAL_TABLET | Freq: Three times a day (TID) | ORAL | Status: DC | PRN

## 2021-11-28 MED ORDER — SERTRALINE HCL 50 MG PO TABS
50.0000 mg | ORAL_TABLET | Freq: Every day | ORAL | Status: DC
  Administered 2021-11-28: 50 mg via ORAL
  Filled 2021-11-28: qty 1

## 2021-11-28 MED ORDER — MAGNESIUM HYDROXIDE 400 MG/5ML PO SUSP: 30.0000 mL | Freq: Every day | ORAL | Status: DC | PRN

## 2021-11-28 MED ORDER — ALUM & MAG HYDROXIDE-SIMETH 200-200-20 MG/5ML PO SUSP: 30.0000 mL | ORAL | Status: DC | PRN

## 2021-11-28 MED ORDER — ACETAMINOPHEN 325 MG PO TABS: 650.0000 mg | ORAL_TABLET | Freq: Four times a day (QID) | ORAL | Status: DC | PRN

## 2021-11-28 MED ORDER — TRAZODONE HCL 50 MG PO TABS: 50.0000 mg | ORAL_TABLET | Freq: Every evening | ORAL | Status: DC | PRN

## 2021-11-28 NOTE — ED Triage Notes (Signed)
Pt states she is having thoughts of wanting to cut her wrists to end her life.  States she has a hard time processing feelings and now she's trying to process everything and it's overwhelming.  Has been on meds before but the meds didn't seem to help.
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
## 2021-11-28 NOTE — ED Notes (Addendum)
Confirmed with patient that all belongings went home with family. No belongings to give to safe trasnport. Left with transport at this time

## 2021-11-28 NOTE — ED Notes (Signed)
TTS in progress 

## 2021-11-28 NOTE — ED Provider Notes (Addendum)
[facility] EMERGENCY DEPARTMENT Provider Note   CSN: [id number] Arrival date & time: 11/28/21  1257     History  Chief Complaint  Patient presents with   Suicidal    Casey Fuentes is a 26 y.o. female.  Pt with hx anxiety, depression, c/o worsening depression in past week and trouble processing her feelings. Indicates stress related to relationship and job. Somewhat decreased appetite. Is sleeping at night. Intermittent suicidal thoughts. Denies any attempt to harm self. Denies problems w etoh/substance abuse.   The history is provided by the patient and medical records.       Home Medications Prior to Admission medications   Medication Sig Start Date End Date Taking? Authorizing Provider  cetirizine (ZYRTEC ALLERGY) 10 MG tablet Take 1 tablet (10 mg total) by mouth daily. Patient not taking: Reported on 11/28/2021 08/06/21   Wallis Bamberg, PA-C  hydrOXYzine (ATARAX/VISTARIL) 50 MG tablet Take 1 tablet (50 mg total) by mouth 3 (three) times daily as needed. Patient not taking: Reported on 11/28/2021 12/14/19   Durward Parcel, FNP  lidocaine (XYLOCAINE) 2 % solution Use as directed 15 mLs in the mouth or throat as needed for mouth pain. Patient not taking: Reported on 11/28/2021 10/10/19   Durward Parcel, FNP  ondansetron (ZOFRAN-ODT) 4 MG disintegrating tablet Take 1 tablet (4 mg total) by mouth every 8 (eight) hours as needed for nausea or vomiting. Patient not taking: Reported on 11/28/2021 03/13/21   Ward, Layla Maw, DO  phenol (CHLORASEPTIC) 1.4 % LIQD Use as directed 1 spray in the mouth or throat 3 (three) times daily as needed for throat irritation / pain. Patient not taking: Reported on 11/28/2021 08/06/21   Wallis Bamberg, PA-C  pseudoephedrine (SUDAFED) 60 MG tablet Take 1 tablet (60 mg total) by mouth every 8 (eight) hours as needed for congestion. Patient not taking: Reported on 11/28/2021 08/06/21   Wallis Bamberg, PA-C  sertraline (ZOLOFT) 50 MG tablet  Take 1 tablet (50 mg total) by mouth daily. Patient not taking: Reported on 12/08/2018 10/21/17   Hildred Laser, MD      Allergies    Amoxicillin    Review of Systems   Review of Systems  Constitutional:  Negative for fever.  HENT:  Negative for sore throat.   Eyes:  Negative for redness.  Respiratory:  Negative for shortness of breath.   Cardiovascular:  Negative for chest pain.  Gastrointestinal:  Negative for abdominal pain.  Genitourinary:  Negative for flank pain.  Musculoskeletal:  Negative for back pain and neck pain.  Skin:  Negative for rash.  Neurological:  Negative for headaches.  Hematological:  Does not bruise/bleed easily.  Psychiatric/Behavioral:  Positive for dysphoric mood and suicidal ideas. The patient is nervous/anxious.     Physical Exam Updated Vital Signs BP 115/76 (BP Location: Left Arm)   Pulse 72   Temp 99.1 F (37.3 C) (Oral)   Resp 16   Ht 1.549 m (5\' 1" )   Wt 90.7 kg   SpO2 96%   BMI 37.78 kg/m  Physical Exam Vitals and nursing note reviewed.  Constitutional:      Appearance: Normal appearance. She is well-developed.  HENT:     Head: Atraumatic.     Nose: Nose normal.     Mouth/Throat:     Mouth: Mucous membranes are moist.  Eyes:     General: No scleral icterus.    Conjunctiva/sclera: Conjunctivae normal.     Pupils: Pupils are equal, round,  and reactive to light.  Neck:     Trachea: No tracheal deviation.  Cardiovascular:     Rate and Rhythm: Normal rate and regular rhythm.     Pulses: Normal pulses.     Heart sounds: Normal heart sounds. No murmur heard.    No friction rub. No gallop.  Pulmonary:     Effort: Pulmonary effort is normal. No respiratory distress.     Breath sounds: Normal breath sounds.  Abdominal:     General: There is no distension.     Tenderness: There is no abdominal tenderness.  Genitourinary:    Comments: No cva tenderness.  Musculoskeletal:        General: No swelling.     Cervical back: Normal range  of motion and neck supple. No rigidity. No muscular tenderness.  Skin:    General: Skin is warm and dry.     Findings: No rash.  Neurological:     Mental Status: She is alert.     Comments: Alert, speech normal. Steady gait.   Psychiatric:     Comments: Depressed mood, flat affect. + SI.      ED Results / Procedures / Treatments   Labs (all labs ordered are listed, but only abnormal results are displayed) Results for orders placed or performed during the hospital encounter of 11/28/21  Resp Panel by RT-PCR (Flu A&B, Covid) Anterior Nasal Swab   Specimen: Anterior Nasal Swab  Result Value Ref Range   SARS Coronavirus 2 by RT PCR NEGATIVE NEGATIVE   Influenza A by PCR NEGATIVE NEGATIVE   Influenza B by PCR NEGATIVE NEGATIVE  Comprehensive metabolic panel  Result Value Ref Range   Sodium 139 135 - 145 mmol/L   Potassium 4.2 3.5 - 5.1 mmol/L   Chloride 108 98 - 111 mmol/L   CO2 24 22 - 32 mmol/L   Glucose, Bld 97 70 - 99 mg/dL   BUN 7 6 - 20 mg/dL   Creatinine, Ser 0.94 0.44 - 1.00 mg/dL   Calcium 9.0 8.9 - 10.3 mg/dL   Total Protein 7.4 6.5 - 8.1 g/dL   Albumin 3.8 3.5 - 5.0 g/dL   AST 18 15 - 41 U/L   ALT 18 0 - 44 U/L   Alkaline Phosphatase 62 38 - 126 U/L   Total Bilirubin 0.5 0.3 - 1.2 mg/dL   GFR, Estimated >60 >60 mL/min   Anion gap 7 5 - 15  Ethanol  Result Value Ref Range   Alcohol, Ethyl (B) Q000111Q Q000111Q mg/dL  Salicylate level  Result Value Ref Range   Salicylate Lvl Q000111Q (L) 7.0 - 30.0 mg/dL  Acetaminophen level  Result Value Ref Range   Acetaminophen (Tylenol), Serum <10 (L) 10 - 30 ug/mL  cbc  Result Value Ref Range   WBC 13.3 (H) 4.0 - 10.5 K/uL   RBC 4.97 3.87 - 5.11 MIL/uL   Hemoglobin 15.3 (H) 12.0 - 15.0 g/dL   HCT 45.3 36.0 - 46.0 %   MCV 91.1 80.0 - 100.0 fL   MCH 30.8 26.0 - 34.0 pg   MCHC 33.8 30.0 - 36.0 g/dL   RDW 13.2 11.5 - 15.5 %   Platelets 285 150 - 400 K/uL   nRBC 0.0 0.0 - 0.2 %  Rapid urine drug screen (hospital performed)  Result  Value Ref Range   Opiates NONE DETECTED NONE DETECTED   Cocaine NONE DETECTED NONE DETECTED   Benzodiazepines NONE DETECTED NONE DETECTED   Amphetamines NONE DETECTED NONE DETECTED  Tetrahydrocannabinol POSITIVE (A) NONE DETECTED   Barbiturates NONE DETECTED NONE DETECTED  I-Stat beta hCG blood, ED  Result Value Ref Range   I-stat hCG, quantitative <5.0 <5 mIU/mL   Comment 3              EKG None  Radiology No results found.  Procedures Procedures    Medications Ordered in ED Medications - No data to display  ED Course/ Medical Decision Making/ A&P                           Medical Decision Making Problems Addressed: Anxiety: acute illness or injury with systemic symptoms Moderate depressive episode: acute illness or injury with systemic symptoms that poses a threat to life or bodily functions Suicidal ideation: acute illness or injury that poses a threat to life or bodily functions  Amount and/or Complexity of Data Reviewed External Data Reviewed: notes. Labs: ordered. Decision-making details documented in ED Course. Discussion of management or test interpretation with external provider(s): BH team  Risk Prescription drug management. Decision regarding hospitalization.   Iv ns. Continuous pulse ox and cardiac monitoring. Labs ordered/sent.   Diff dx includes depression, anxiety, acute stress rxn, etc. - dispo decision including potential need for admission considered - will get labs and [facility] consult and reassess.   Reviewed nursing notes and prior charts for additional history. External reports reviewed.   Med rec/plan to restart meds.   Cardiac monitor: sinus rhythm, rate 72.   Labs reviewed/interpreted by me - preg neg. Chem normal.   BH team consulted.   Disposition per [facility] team.   The patient has been placed in psychiatric observation due to the need to provide a safe environment for the patient while obtaining psychiatric consultation and evaluation,  as well as ongoing medical and medication management to treat the patient's condition.  The patient has not been placed under full IVC at this time.    BH team /Ford, indicates pt accepted to [facility] provider [facility].   Pt appears stable for transport to [facility].     Final Clinical Impression(s) / ED Diagnoses Final diagnoses:  None    Rx / DC Orders ED Discharge Orders     None         Cathren Laine, MD 11/28/21 1759    Cathren Laine, MD 11/28/21 2157

## 2021-11-28 NOTE — ED Notes (Signed)
Pt confirms that she is ok with her mother receiving updates about care. Attempted to reach mother with update, unable to reach. Pt aware of plan to go to [facility] for treatment.

## 2021-11-28 NOTE — ED Notes (Signed)
Transported via Psychologist, educational to [employer]

## 2021-11-28 NOTE — ED Notes (Signed)
Mother remains with patient in hall. Pt and mother informed on no visitor policy. Informed on phone and belonging policy and IVC process. Mother takes all belongings.

## 2021-11-28 NOTE — ED Notes (Signed)
[phone] Mom Velna Hatchet

## 2021-11-29 ENCOUNTER — Encounter (HOSPITAL_COMMUNITY): Payer: Self-pay | Admitting: Psychiatry

## 2021-11-29 ENCOUNTER — Inpatient Hospital Stay (HOSPITAL_COMMUNITY)
Admission: AD | Admit: 2021-11-29 | Discharge: 2021-12-06 | DRG: 885 | Disposition: A | Discharge status: Home / Self Care | Payer: Medicaid Other | Source: Ambulatory Visit | Attending: Psychiatry | Admitting: Psychiatry

## 2021-11-29 DIAGNOSIS — Z813 Family history of other psychoactive substance abuse and dependence: Secondary | ICD-10-CM | POA: Diagnosis not present

## 2021-11-29 DIAGNOSIS — F1490 Cocaine use, unspecified, uncomplicated: Secondary | ICD-10-CM | POA: Diagnosis present

## 2021-11-29 DIAGNOSIS — F1210 Cannabis abuse, uncomplicated: Secondary | ICD-10-CM | POA: Diagnosis present

## 2021-11-29 DIAGNOSIS — F17290 Nicotine dependence, other tobacco product, uncomplicated: Secondary | ICD-10-CM | POA: Diagnosis present

## 2021-11-29 DIAGNOSIS — F32A Depression, unspecified: Secondary | ICD-10-CM | POA: Diagnosis present

## 2021-11-29 DIAGNOSIS — F329 Major depressive disorder, single episode, unspecified: Secondary | ICD-10-CM | POA: Diagnosis present

## 2021-11-29 DIAGNOSIS — F3130 Bipolar disorder, current episode depressed, mild or moderate severity, unspecified: Principal | ICD-10-CM | POA: Diagnosis present

## 2021-11-29 DIAGNOSIS — Z20822 Contact with and (suspected) exposure to covid-19: Secondary | ICD-10-CM | POA: Diagnosis present

## 2021-11-29 DIAGNOSIS — Z818 Family history of other mental and behavioral disorders: Secondary | ICD-10-CM | POA: Diagnosis not present

## 2021-11-29 DIAGNOSIS — Z6837 Body mass index (BMI) 37.0-37.9, adult: Secondary | ICD-10-CM

## 2021-11-29 DIAGNOSIS — Z9152 Personal history of nonsuicidal self-harm: Secondary | ICD-10-CM

## 2021-11-29 DIAGNOSIS — F411 Generalized anxiety disorder: Secondary | ICD-10-CM | POA: Diagnosis present

## 2021-11-29 DIAGNOSIS — R419 Unspecified symptoms and signs involving cognitive functions and awareness: Secondary | ICD-10-CM | POA: Diagnosis not present

## 2021-11-29 DIAGNOSIS — G4700 Insomnia, unspecified: Secondary | ICD-10-CM | POA: Diagnosis present

## 2021-11-29 DIAGNOSIS — Z880 Allergy status to penicillin: Secondary | ICD-10-CM | POA: Diagnosis not present

## 2021-11-29 DIAGNOSIS — R45851 Suicidal ideations: Secondary | ICD-10-CM | POA: Diagnosis present

## 2021-11-29 DIAGNOSIS — F332 Major depressive disorder, recurrent severe without psychotic features: Secondary | ICD-10-CM | POA: Diagnosis not present

## 2021-11-29 DIAGNOSIS — Z975 Presence of (intrauterine) contraceptive device: Secondary | ICD-10-CM | POA: Diagnosis not present

## 2021-11-29 HISTORY — DX: Dyspnea, unspecified: R06.00

## 2021-11-29 MED ORDER — TRAZODONE HCL 50 MG PO TABS
50.0000 mg | ORAL_TABLET | Freq: Every evening | ORAL | Status: DC | PRN
  Administered 2021-11-29: 50 mg via ORAL
  Filled 2021-11-29 (×2): qty 1

## 2021-11-29 MED ORDER — ACETAMINOPHEN 325 MG PO TABS: 650.0000 mg | ORAL_TABLET | Freq: Four times a day (QID) | ORAL | Status: DC | PRN

## 2021-11-29 MED ORDER — SERTRALINE HCL 25 MG PO TABS
25.0000 mg | ORAL_TABLET | Freq: Every day | ORAL | Status: DC
Start: 2021-11-29 — End: 2021-11-30
  Administered 2021-11-29 – 2021-11-30 (×2): 25 mg via ORAL
  Filled 2021-11-29 (×4): qty 1

## 2021-11-29 MED ORDER — MAGNESIUM HYDROXIDE 400 MG/5ML PO SUSP: 30.0000 mL | Freq: Every day | ORAL | Status: DC | PRN

## 2021-11-29 MED ORDER — NICOTINE 14 MG/24HR TD PT24
14.0000 mg | MEDICATED_PATCH | Freq: Every day | TRANSDERMAL | Status: DC
  Administered 2021-11-29: 14 mg via TRANSDERMAL
  Filled 2021-11-29: qty 1

## 2021-11-29 MED ORDER — ALUM & MAG HYDROXIDE-SIMETH 200-200-20 MG/5ML PO SUSP: 30.0000 mL | ORAL | Status: DC | PRN

## 2021-11-29 MED ORDER — NICOTINE 21 MG/24HR TD PT24
21.0000 mg | MEDICATED_PATCH | Freq: Every day | TRANSDERMAL | Status: DC
  Administered 2021-11-29 – 2021-12-06 (×7): 21 mg via TRANSDERMAL
  Filled 2021-11-29 (×9): qty 1

## 2021-11-29 NOTE — Progress Notes (Signed)
Admit Note. Patient admitted to [facility] voluntarily. Patient reports she has been increasingly depressed, feeling overwhelmed and that she has been having suicidal ideation with a plan to overdose on pills. Patient states '' I got evicted out of my place and was staying with my brother. When I got evicted I also lost my car so I'm using my grandmother's car and it's just like, she's constantly questioning when I use it and there's stress. My boyfriend there is also stress there and my work, I work nights. '' Patient states she did have an overdose in which she took her mother's bp pills and did not disclose to anyone. Pt states this was 6 yrs ago. Pt denies any current SI and is able to contract for safety. Pt skin searched no contraband found. Oriented to unit and meal provided. Pt is safe, will con't to monitor.

## 2021-11-29 NOTE — ED Provider Notes (Addendum)
[facility]/OBS ASAP Discharge Summary  Date and Time: 11/29/2021 11:31 AM  Name: Casey Fuentes  MRN:  [id number]   Discharge Diagnoses:  Final diagnoses:  Severe episode of recurrent major depressive disorder, without psychotic features [facility])    Subjective:  Patient was accepted to [facility] under MD Attiah Nadir services.   Stay Summary: Per admission assessment note: "Pt is a 25 year old female who presents to [facility] ED accompanied by her mother, Casey Fuentes [phone], who did not participate in assessment. Pt states her mother encourage her to come to ED after Pt expressed depressive symptoms and suicidal ideation. Pt says she has experiences depressive symptoms since childhood and these symptoms have become more severe over the past week. She reports recurring suicidal ideation with thoughts of cutting her wrist or overdosing on pills. Pt reports one previous suicide attempt over a year ago by ingesting several tablet of her mother's blood pressure medication. She says she did not seek medical or mental health treatment at the time. She reports a history of superficial cutting as an adolescent and denies any NSSIB recently. She describes her mood as severely depressed and Pt acknowledges symptoms including crying spells, social withdrawal, loss of interest in usual pleasures, fatigue, irritability, decreased concentration, increased sleep, staying in bed, decreased appetite and feelings of guilt, worthlessness and hopelessness. She describes feeling paranoid, believing people are talking about her, adding "I think people are saying things when they are not or doing things they are not doing." She denies auditory or visual hallucinations. She denies homicidal ideation or history of aggression."   Total Time spent with patient: 15 minutes  Past Psychiatric History:  Past Medical History:  Past Medical History:  Diagnosis Date   Anxiety 10/22/2017   Chlamydia infection during pregnancy 02/03/2017    Deliberate self-cutting 10/22/2017   Age [age]   Morbid obesity (HCC) 06/01/2017   Postpartum depression 10/22/2017    Past Surgical History:  Procedure Laterality Date   CESAREAN SECTION N/A 08/02/2017   Procedure: CESAREAN SECTION;  Surgeon: Hildred Laser, MD;  Location: [facility] ORS;  Service: Obstetrics;  Laterality: N/A;   TONSILLECTOMY     Family History:  Family History  Problem Relation Age of Onset   Diabetes Mother    Thyroid disease Mother    Cirrhosis Mother    Healthy Father    Family Psychiatric History:  Social History:  Social History   Substance and Sexual Activity  Alcohol Use No     Social History   Substance and Sexual Activity  Drug Use Yes   Types: Marijuana    Social History   Socioeconomic History   Marital status: Single    Spouse name: Not on file   Number of children: Not on file   Years of education: Not on file   Highest education level: Not on file  Occupational History   Not on file  Tobacco Use   Smoking status: Never   Smokeless tobacco: Never  Vaping Use   Vaping Use: Every day  Substance and Sexual Activity   Alcohol use: No   Drug use: Yes    Types: Marijuana   Sexual activity: Yes    Partners: Male    Birth control/protection: None, I.U.D.  Other Topics Concern   Not on file  Social History Narrative   Not on file   Social Determinants of Health   Financial Resource Strain: Not on file  Food Insecurity: Not on file  Transportation Needs: Not on file  Physical Activity: Not on file  Stress: Not on file  Social Connections: Not on file   SDOH:  SDOH Screenings   Alcohol Screen: Not on file  Depression (PHQ2-9): Medium Risk (06/29/2019)   Depression (PHQ2-9)    PHQ-2 Score: 21  Financial Resource Strain: Not on file  Food Insecurity: Not on file  Housing: Not on file  Physical Activity: Not on file  Social Connections: Not on file  Stress: Not on file  Tobacco Use: Low Risk  (11/28/2021)   Patient History     Smoking Tobacco Use: Never    Smokeless Tobacco Use: Never    Passive Exposure: Not on file  Transportation Needs: Not on file    Tobacco Cessation:  N/A, patient does not currently use tobacco products  Current Medications:  Current Facility-Administered Medications  Medication Dose Route Frequency Provider Last Rate Last Admin   acetaminophen (TYLENOL) tablet 650 mg  650 mg Oral Q6H PRN Bobbitt, Shalon E, NP       alum & mag hydroxide-simeth (MAALOX/MYLANTA) 200-200-20 MG/5ML suspension 30 mL  30 mL Oral Q4H PRN Bobbitt, Shalon E, NP       hydrOXYzine (ATARAX) tablet 25 mg  25 mg Oral TID PRN Bobbitt, Shalon E, NP       magnesium hydroxide (MILK OF MAGNESIA) suspension 30 mL  30 mL Oral Daily PRN Bobbitt, Shalon E, NP       nicotine (NICODERM CQ - dosed in mg/24 hours) patch 14 mg  14 mg Transdermal Daily Bobbitt, Shalon E, NP   14 mg at 11/29/21 0849   traZODone (DESYREL) tablet 50 mg  50 mg Oral QHS PRN Bobbitt, Shalon E, NP       No current outpatient medications on file.    PTA Medications: (Not in a hospital admission)      06/29/2019    5:39 PM 06/06/2017   11:10 AM  Depression screen PHQ 2/9  Decreased Interest 2 0  Down, Depressed, Hopeless 3 0  PHQ - 2 Score 5 0  Altered sleeping 3   Tired, decreased energy 3   Change in appetite 2   Feeling bad or failure about yourself  3   Trouble concentrating 2   Moving slowly or fidgety/restless 0   Suicidal thoughts 3   PHQ-9 Score 21   Difficult doing work/chores Very difficult     Flowsheet Row ED from 11/28/2021 in [facility] Most recent reading at 11/29/2021 12:54 AM ED from 11/28/2021 in [facility] EMERGENCY DEPARTMENT Most recent reading at 11/28/2021  1:15 PM ED from 08/06/2021 in [facility] [place] Most recent reading at 08/06/2021  6:19 PM  C-SSRS RISK CATEGORY Moderate Risk High Risk No Risk       Musculoskeletal  Strength & Muscle Tone: within  normal limits Gait & Station: normal Patient leans: N/A  Psychiatric Specialty Exam  Presentation  General Appearance: Casual  Eye Contact:Good  Speech:Clear and Coherent  Speech Volume:Normal  Handedness:Right   Mood and Affect  Mood:Depressed  Affect:Flat   Thought Process  Thought Processes:Coherent  Descriptions of Associations:Intact  Orientation:Full (Time, Place and Person)  Thought Content:WDL  Diagnosis of Schizophrenia or Schizoaffective disorder in past: No    Hallucinations:Hallucinations: None  Ideas of Reference:None  Suicidal Thoughts:Suicidal Thoughts: Yes, Active SI Active Intent and/or Plan: With Intent; With Plan  Homicidal Thoughts:Homicidal Thoughts: No   Sensorium  Memory:Immediate Good; Recent Good; Remote Good  Judgment:Fair  Insight:Fair  Executive Functions  Concentration:Good  Attention Span:Fair  Recall:Fair  [employer] of Knowledge:Good  Language:Good   Psychomotor Activity  Psychomotor Activity:Psychomotor Activity: Normal   Assets  Assets:Communication Skills; Financial Resources/Insurance; Housing; Physical Health   Sleep  Sleep:Sleep: Fair Number of Hours of Sleep: -1   Nutritional Assessment (For OBS and [facility] admissions only) Has the patient had a weight loss or gain of 10 pounds or more in the last 3 months?: No Has the patient had a decrease in food intake/or appetite?: No Does the patient have dental problems?: No Does the patient have eating habits or behaviors that may be indicators of an eating disorder including binging or inducing vomiting?: No Has the patient recently lost weight without trying?: 0    Physical Exam  Physical Exam Vitals and nursing note reviewed.  Cardiovascular:     Rate and Rhythm: Normal rate and regular rhythm.  Neurological:     Mental Status: She is oriented to person, place, and time.  Psychiatric:        Mood and Affect: Mood normal.        Behavior: Behavior  normal.        Thought Content: Thought content normal.    Review of Systems  Constitutional: Negative.   Eyes: Negative.   Gastrointestinal: Negative.   Genitourinary: Negative.   Skin: Negative.   Psychiatric/Behavioral:  Positive for depression, substance abuse and suicidal ideas. The patient is nervous/anxious.    Blood pressure 110/68, pulse 63, temperature 98.6 F (37 C), temperature source Oral, resp. rate 18, SpO2 97 %. There is no height or weight on file to calculate BMI.  Demographic Factors:  Caucasian  Loss Factors: NA  Historical Factors: Family history of mental illness or substance abuse and Impulsivity  Risk Reduction Factors:   Living with another person, especially a relative, Positive social support, Positive therapeutic relationship, and Positive coping skills or problem solving skills  Continued Clinical Symptoms:  Severe Anxiety and/or Agitation Depression:   Impulsivity  Cognitive Features That Contribute To Risk:  Closed-mindedness    Suicide Risk:  Minimal: No identifiable suicidal ideation.  Patients presenting with no risk factors but with morbid ruminations; may be classified as minimal risk based on the severity of the depressive symptoms  Plan Of Care/Follow-up recommendations:  Activity:  as tolerated  Diet:  heart healthy     Disposition: Patient to be transferred to [facility]  Oneta Rack, NP 11/29/2021, 11:31 AM

## 2021-11-29 NOTE — [facility] Group Notes (Signed)
Adult Psychoeducational Group  Date:  11/29/2021 Time:  1300-1400  Group Topic/Focus: Continuation of the group from Saturday. Looking at the lists that were created and talking about what needs to be done with the homework of 30 positives about themselves.                                     Talking about taking their power back and helping themselves to develop a positive self esteem.      Participation Quality:  Appropriate  Affect:  Appropriate  Cognitive:  Oriented  Insight: Improving  Engagement in Group:  Engaged  Modes of Intervention:  Activity, Discussion, Education, and Support  Additional Comments:  rates her energy at a 2/10. Left the group after it started.  Dione Housekeeper

## 2021-11-29 NOTE — [facility] Group Notes (Signed)
Adult Psychoeducational Group Note Date:  11/29/2021 Time:  0900-1045 Group Topic/Focus: PROGRESSIVE RELAXATION. A group where deep breathing is taught and tensing and relaxation muscle groups is used. Imagery is used as well.  Pts are asked to imagine 3 pillars that hold them up when they are not able to hold themselves up and to share that with the group.  Participation Level:  did not attend  Korissa Horsford A  

## 2021-11-29 NOTE — ED Notes (Signed)
Pt reports she slept well overnight.  She speaks minimally with this Clinical research associate and has brief eye contact.  She denies SI, HI, AVH and pain.  Breathing is even and unlabored.  Will continue to monitor for safety.

## 2021-11-29 NOTE — ED Notes (Signed)
Pt sleeping in bed. Respirations even and unlabored with no signs of acute distress noted. Will continue to monitor for safety.  

## 2021-11-29 NOTE — Discharge Instructions (Addendum)
Take all medications as prescribed. Keep all follow-up appointments as scheduled.  Do not consume alcohol or use illegal drugs while on prescription medications. Report any adverse effects from your medications to your primary care provider promptly.  In the event of recurrent symptoms or worsening symptoms, call 911, a crisis hotline, or go to the nearest emergency department for evaluation.   
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
## 2021-11-29 NOTE — Progress Notes (Signed)
[facility] Group Notes:  (Nursing/MHT/Case Management/Adjunct)  Date:  11/29/2021  Time:  2000  Type of Therapy:   wrap up group  Participation Level:  Active  Participation Quality:  Appropriate, Attentive, Sharing, and Supportive  Affect:  Anxious and Depressed  Cognitive:  Alert  Insight:  Improving  Engagement in Group:  Engaged  Modes of Intervention:  Clarification, Education, and Support  Summary of Progress/Problems: Positive thinking and positive change were discussed.   Marcille Buffy 11/29/2021, 8:46 PM

## 2021-11-29 NOTE — ED Notes (Signed)
Pin Rm 134 sitting in chair. A&Ox4 with no complaints of pain. Calm and cooperative, with no signs of acute distress. Pt states that her current mood is sad and depressed. Pt states that she currently has SI with thoughts of taking pills. No plan of type of pills, where, or when to act this out. Denies HI and AVH at present. States that she feels that she "hears people say things that they actually are not from time to time." Will continue to monitor for safety.

## 2021-11-29 NOTE — Plan of Care (Signed)
Reports passive S.I.No plan and no current intent. Attended group. Interacting in milieu. Has been compliant with mediations. Trazodone to help with sleep. Able to identify stressors and verbalize feelings to this staff.

## 2021-11-30 ENCOUNTER — Encounter (HOSPITAL_COMMUNITY): Payer: Self-pay

## 2021-11-30 ENCOUNTER — Encounter (HOSPITAL_COMMUNITY): Payer: Self-pay | Admitting: Psychiatry

## 2021-11-30 DIAGNOSIS — F3130 Bipolar disorder, current episode depressed, mild or moderate severity, unspecified: Principal | ICD-10-CM

## 2021-11-30 DIAGNOSIS — G4700 Insomnia, unspecified: Secondary | ICD-10-CM | POA: Insufficient documentation

## 2021-11-30 MED ORDER — TRAZODONE HCL 100 MG PO TABS
100.0000 mg | ORAL_TABLET | Freq: Every day | ORAL | Status: DC
  Administered 2021-11-30 – 2021-12-02 (×3): 100 mg via ORAL
  Filled 2021-11-30 (×4): qty 1

## 2021-11-30 MED ORDER — HYDROXYZINE HCL 50 MG PO TABS
50.0000 mg | ORAL_TABLET | Freq: Once | ORAL | Status: AC
  Administered 2021-11-30: 50 mg via ORAL
  Filled 2021-11-30 (×2): qty 1

## 2021-11-30 MED ORDER — SERTRALINE HCL 50 MG PO TABS
50.0000 mg | ORAL_TABLET | Freq: Every day | ORAL | Status: DC
  Filled 2021-11-30 (×2): qty 1

## 2021-11-30 MED ORDER — ARIPIPRAZOLE 5 MG PO TABS
5.0000 mg | ORAL_TABLET | Freq: Every day | ORAL | Status: DC
  Administered 2021-11-30 – 2021-12-01 (×2): 5 mg via ORAL
  Filled 2021-11-30 (×4): qty 1

## 2021-11-30 NOTE — H&P (Signed)
Psychiatric Admission Assessment Adult  Patient Identification: Casey Fuentes MRN:  [id number] Date of Evaluation:  11/30/2021 Chief Complaint:  MDD (major depressive disorder) [F32.9] Principal Diagnosis: Bipolar I disorder, most recent episode depressed (HCC) Diagnosis:  Principal Problem:   Bipolar I disorder, most recent episode depressed (HCC) Active Problems:   Anxiety state   Insomnia  History of Present Illness: Casey Pulver. Fuentes is a 26 yo Caucasian female with a reported history of postpartum depression, who presented to the [facility] ED on 11/28/21 with complaints of worsening suicidal thoughts with plans to either cut her wrists or overdose on pills. Pt was transferred to the [facility] behavioral health urgent care for continuous assessment, and subsequently transferred voluntarily to this [facility] for treatment and stabilization of her mood.  On assessment, pt is able to collaborate the information above, and reports worsening depressive symptoms over the past 1.5 weeks; she reports worsening hypersomnia, feelings of worthlessness, hopelessness, helplessness, anxiety, frustration, low energy levels, poor appetite, panic attacks & trouble with concentration and focus.  She also describes manic type symptoms which come and go, and last happened a week and a half ago; She reports that during that time, she indulged in a lot of risk taking behaviors such as binging on drugs, hypersexuality, poor sleep, but without the need for sleep, poor appetite, but not feeling hungry & high energy levels followed by a crash & depressive symptoms. She reports that episodes typically last 3-5 days followed by a depressive episode. She reports paranoia which started a year ago, and states that she feels as though people are looking at her and talking about her.  Pt reports her current stressors as being mostly financial in nature; she reports that she was recently evicted from her apartment due to  non payment, and resides with her brother currently. She reports that she also left her car at her apartment because the transmission gave out, and her landlord sold it. She reports relationship issues with boyfriend who works out of town, reports that she works the night shift at [employer], which is stressful & reports that it is stressful taking care of her 69 yo son.   Past Psychiatric Hx: Patient reports that she was diagnosed with postpartum depression 4 yrs ago after she had her son. She  denies any other formal mental health diagnoses. Pt reports a history of self injurious behaviors which started in elementary school, and she reports cutting and burning herself, but states that she has not indulged in these behaviors in multiple years. She reports trauma from the death of her childhood friend from a four wheeler accident in 7 th grade, and reports that she has not fully dealt with this loss. She reports a suicide attempt via overdosing on her mother's BP pills 6 yrs ago, and states that she did not tell any one, and did not get any MH help at the time. She denies any prior history of a mental health related hospitalization.   Substance use history:  Patient reports a history of trying cocaine & Molly in the past, and admits to occasional "narcotics" use, and states that she uses "Oxys", and "Roxys" which she buys off the streets when she is feeling down and depressed.  She reports that she started using marijuana at age 75 and currently uses 4 grams daily. She also reports that she vapes nicotine daily. She denies alcohol use, and denies any other substance use/abuse. Patient has been educated on the negative  impact of substance abuse on her mental health, and educated on the need to stop use. She states that using THC "helps me feel normal". She has been educated on the need to continue taking her medications and stop using THC as she seems to be using THC to self medicate her anxiety and  depressive symptoms.  Past psychiatric medication history: Pt reports past trials of Zoloft, but states that she only used the Zoloft for 2-3 weeks after she was diagnosed with post partum depression, and stopped. She denies any other psychotropic medication use in the past.  Family history:  Patient reports that her mother has a history of MDD, and that her maternal grandmother has a history of alcohol abuse & narcotics abuse.  Past Medical History: Denies  Prior Surgeries: Tonsillectomy, C-section  Head trauma, LOC, concussions, seizures: Denies Allergies: Amoxicillin LMP: unable to recall, states she has an IUD which makes periods unpredictable. Contraception: IUD PCP: None.  Additional Social History: Pt reports that she resides with her brother as mentioned above, and works at [employer] as mentioned above.  Current Presentation: During this encounter, pt presents with a depressed & anxious mood, & affect is congruent. She is oriented to person, place, time & situation, and knows the current president.  Her attention to personal hygiene and grooming is fair, eye contact is good, speech is clear & coherent. Thought contents are organized and logical, and pt currently denies SI/HI/AVH. She reports feeling like people always look at her and, talk about her, but this might be related to her chronic THC use. Pt has been educated on this.  Medication plan: Pt was started on on Zoloft at admission. We will discontinue this medication as it might cause mania in patient (described manic type symptoms which occur frequently as described above). We will start pt on Abilify 5 mg for management of bipolar 1 d/o, with a goal to increase to 10 mg prior to discharge. We will increase Trazodone to 100 mg nightly for insomnia as she complained of poor sleep last night. We will keep Hydroxyzine at 25 mg TID PRN for anxiety. Pt educated on the rationales, benefits and possible side effects of the above  medications and verbalizes understanding and is agreeable to trials.  Total Time spent with patient: 1.5 hours  Associated Signs/Symptoms: Depression Symptoms:  depressed mood, anhedonia, hypersomnia, fatigue, feelings of worthlessness/guilt, difficulty concentrating, hopelessness, recurrent thoughts of death, anxiety, panic attacks, loss of energy/fatigue, Duration of Depression Symptoms: Greater than two weeks  (Hypo) Manic Symptoms:  Impulsivity, Irritable Mood, Anxiety Symptoms:  Excessive Worry, Panic Symptoms, Psychotic Symptoms:  Paranoia, PTSD Symptoms: Had a traumatic exposure:  Loss of best friend in 7th grade Total Time spent with patient: 1.5 hours  Past Psychiatric History: post partum depression  Is the patient at risk to self? Yes.    Has the patient been a risk to self in the past 6 months? Yes.    Has the patient been a risk to self within the distant past? Yes.    Is the patient a risk to others? No.  Has the patient been a risk to others in the past 6 months? No.  Has the patient been a risk to others within the distant past? No.   [place] Scale:  Flowsheet Row [facility] Most recent reading at 11/29/2021  1:00 PM ED from 11/28/2021 in [facility] Most recent reading at 11/29/2021 12:54  AM ED from 11/28/2021 in [facility] EMERGENCY DEPARTMENT Most recent reading at 11/28/2021  1:15 PM  C-SSRS RISK CATEGORY High Risk Moderate Risk High Risk        Prior Inpatient Therapy:   Prior Outpatient Therapy:    Alcohol Screening: 1. How often do you have a drink containing alcohol?: Monthly or less 2. How many drinks containing alcohol do you have on a typical day when you are drinking?: 1 or 2 3. How often do you have six or more drinks on one occasion?: Never AUDIT-C Score: 1 Substance Abuse History in the last 12 months:  Yes.    Consequences of Substance Abuse: Medical Consequences:  Paranoia Previous Psychotropic Medications: Yes  Psychological Evaluations: No  Past Medical History:  Past Medical History:  Diagnosis Date   Anxiety 10/22/2017   Chlamydia infection during pregnancy 02/03/2017   Deliberate self-cutting 10/22/2017   Age [age]   Morbid obesity (HCC) 06/01/2017   Postpartum depression 10/22/2017    Past Surgical History:  Procedure Laterality Date   CESAREAN SECTION N/A 08/02/2017   Procedure: CESAREAN SECTION;  Surgeon: Hildred Laser, MD;  Location: [facility] ORS;  Service: Obstetrics;  Laterality: N/A;   TONSILLECTOMY     Family History:  Family History  Problem Relation Age of Onset   Diabetes Mother    Thyroid disease Mother    Cirrhosis Mother    Healthy Father    Family Psychiatric  History: Mother with depression, maternal grandmother with substance abuse Tobacco Screening:  yes Social History:  Social History   Substance and Sexual Activity  Alcohol Use No     Social History   Substance and Sexual Activity  Drug Use Yes   Types: Marijuana, Cocaine   Comment: cocaine is occasional uds did not show    Additional Social History: See above   Allergies:   Allergies  Allergen Reactions   Amoxicillin    Lab Results:  Results for orders placed or performed during the hospital encounter of 11/28/21 (from the past 48 hour(s))  Comprehensive metabolic panel     Status: None   Collection Time: 11/28/21  1:35 PM  Result Value Ref Range   Sodium 139 135 - 145 mmol/L   Potassium 4.2 3.5 - 5.1 mmol/L   Chloride 108 98 - 111 mmol/L   CO2 24 22 - 32 mmol/L   Glucose, Bld 97 70 - 99 mg/dL    Comment: Glucose reference range applies only to samples taken after fasting for at least 8 hours.   BUN 7 6 - 20 mg/dL   Creatinine, Ser 2.80 0.44 - 1.00 mg/dL   Calcium 9.0 8.9 - 03.4 mg/dL   Total Protein 7.4 6.5 - 8.1 g/dL   Albumin 3.8 3.5 - 5.0 g/dL   AST 18 15 - 41 U/L   ALT 18 0 - 44 U/L    Alkaline Phosphatase 62 38 - 126 U/L   Total Bilirubin 0.5 0.3 - 1.2 mg/dL   GFR, Estimated >91 >79 mL/min    Comment: (NOTE) Calculated using the CKD-EPI Creatinine Equation (2021)    Anion gap 7 5 - 15    Comment: Performed at [facility], [address], [place], [place] [address]  Ethanol     Status: None   Collection Time: 11/28/21  1:35 PM  Result Value Ref Range   Alcohol, Ethyl (B) <10 <10 mg/dL    Comment: (NOTE) Lowest detectable limit for serum alcohol is 10 mg/dL.  For medical purposes only. Performed at [facility], [address] [place], [place] [address]   Salicylate level     Status: Abnormal   Collection Time: 11/28/21  1:35 PM  Result Value Ref Range   Salicylate Lvl <7.0 (L) 7.0 - 30.0 mg/dL    Comment: Performed at [facility], [address] [place], [place] [address]  Acetaminophen level     Status: Abnormal   Collection Time: 11/28/21  1:35 PM  Result Value Ref Range   Acetaminophen (Tylenol), Serum <10 (L) 10 - 30 ug/mL    Comment: (NOTE) Therapeutic concentrations vary significantly. A range of 10-30 ug/mL  may be an effective concentration for many patients. However, some  are best treated at concentrations outside of this range. Acetaminophen concentrations >150 ug/mL at 4 hours after ingestion  and >50 ug/mL at 12 hours after ingestion are often associated with  toxic reactions.  Performed at [facility], [address] [place], [place] [address]   cbc     Status: Abnormal   Collection Time: 11/28/21  1:35 PM  Result Value Ref Range   WBC 13.3 (H) 4.0 - 10.5 K/uL   RBC 4.97 3.87 - 5.11 MIL/uL   Hemoglobin 15.3 (H) 12.0 - 15.0 g/dL   HCT 21.345.3 08.636.0 - 57.846.0 %   MCV 91.1 80.0 - 100.0 fL   MCH 30.8 26.0 - 34.0 pg   MCHC 33.8 30.0 - 36.0 g/dL   RDW 46.913.2 62.911.5 - 52.815.5 %   Platelets 285 150 - 400 K/uL   nRBC 0.0 0.0 - 0.2 %    Comment: Performed at [facility], [address] [place], [place] [address]   Rapid urine drug screen (hospital performed)     Status: Abnormal   Collection Time: 11/28/21  1:48 PM  Result Value Ref Range   Opiates NONE DETECTED NONE DETECTED   Cocaine NONE DETECTED NONE DETECTED   Benzodiazepines NONE DETECTED NONE DETECTED   Amphetamines NONE DETECTED NONE DETECTED   Tetrahydrocannabinol POSITIVE (A) NONE DETECTED   Barbiturates NONE DETECTED NONE DETECTED    Comment: (NOTE) DRUG SCREEN FOR MEDICAL PURPOSES ONLY.  IF CONFIRMATION IS NEEDED FOR ANY PURPOSE, NOTIFY LAB WITHIN 5 DAYS.  LOWEST DETECTABLE LIMITS FOR URINE DRUG SCREEN Drug Class                     Cutoff (ng/mL) Amphetamine and metabolites    1000 Barbiturate and metabolites    200 Benzodiazepine                 200 Tricyclics and metabolites     300 Opiates and metabolites        300 Cocaine and metabolites        300 THC                            50 Performed at [facility], [address] [place], [place] [address]   I-Stat beta hCG blood, ED     Status: None   Collection Time: 11/28/21  1:50 PM  Result Value Ref Range   I-stat hCG, quantitative <5.0 <5 mIU/mL   Comment 3            Comment:   GEST. AGE      CONC.  (mIU/mL)   <=1 WEEK        5 - 50  2 WEEKS       50 - 500     3 WEEKS       100 - 10,000     4 WEEKS     1,000 - 30,000        FEMALE AND NON-PREGNANT FEMALE:     LESS THAN 5 mIU/mL   Resp Panel by RT-PCR (Flu A&B, Covid) Anterior Nasal Swab     Status: None   Collection Time: 11/28/21  5:54 PM   Specimen: Anterior Nasal Swab  Result Value Ref Range   SARS Coronavirus 2 by RT PCR NEGATIVE NEGATIVE    Comment: (NOTE) SARS-CoV-2 target nucleic acids are NOT DETECTED.  The SARS-CoV-2 RNA is generally detectable in upper respiratory specimens during the acute phase of infection. The lowest concentration of SARS-CoV-2 viral copies this assay can detect is 138 copies/mL. A negative result does not preclude SARS-Cov-2 infection and should not be used as the  sole basis for treatment or other patient management decisions. A negative result may occur with  improper specimen collection/handling, submission of specimen other than nasopharyngeal swab, presence of viral mutation(s) within the areas targeted by this assay, and inadequate number of viral copies(<138 copies/mL). A negative result must be combined with clinical observations, patient history, and epidemiological information. The expected result is Negative.  Fact Sheet for Patients:  BloggerCourse.com  Fact Sheet for Healthcare Providers:  SeriousBroker.it  This test is no t yet approved or cleared by the [place] FDA and  has been authorized for detection and/or diagnosis of SARS-CoV-2 by FDA under an Emergency Use Authorization (EUA). This EUA will remain  in effect (meaning this test can be used) for the duration of the COVID-19 declaration under Section 564(b)(1) of the Act, 21 U.S.C.section 360bbb-3(b)(1), unless the authorization is terminated  or revoked sooner.       Influenza A by PCR NEGATIVE NEGATIVE   Influenza B by PCR NEGATIVE NEGATIVE    Comment: (NOTE) The Xpert Xpress SARS-CoV-2/FLU/RSV plus assay is intended as an aid in the diagnosis of influenza from Nasopharyngeal swab specimens and should not be used as a sole basis for treatment. Nasal washings and aspirates are unacceptable for Xpert Xpress SARS-CoV-2/FLU/RSV testing.  Fact Sheet for Patients: BloggerCourse.com  Fact Sheet for Healthcare Providers: SeriousBroker.it  This test is not yet approved or cleared by the [place] FDA and has been authorized for detection and/or diagnosis of SARS-CoV-2 by FDA under an Emergency Use Authorization (EUA). This EUA will remain in effect (meaning this test can be used) for the duration of the COVID-19 declaration under Section 564(b)(1) of the Act, 21  U.S.C. section 360bbb-3(b)(1), unless the authorization is terminated or revoked.  Performed at [facility], [address] [place], [place] [address]     Blood Alcohol level:  Lab Results  Component Value Date   ETH <10 11/28/2021    Metabolic Disorder Labs:  Lab Results  Component Value Date   HGBA1C 4.5 (L) 01/14/2017   No results found for: "PROLACTIN" No results found for: "CHOL", "TRIG", "HDL", "CHOLHDL", "VLDL", "LDLCALC"  Current Medications: Current Facility-Administered Medications  Medication Dose Route Frequency Provider Last Rate Last Admin   acetaminophen (TYLENOL) tablet 650 mg  650 mg Oral Q6H PRN Oneta Rack, NP       alum & mag hydroxide-simeth (MAALOX/MYLANTA) 200-200-20 MG/5ML suspension 30 mL  30 mL Oral Q4H PRN Oneta Rack, NP       ARIPiprazole (ABILIFY) tablet  5 mg  5 mg Oral Daily Zaeden Lastinger, NP       magnesium hydroxide (MILK OF MAGNESIA) suspension 30 mL  30 mL Oral Daily PRN Oneta Rack, NP       nicotine (NICODERM CQ - dosed in mg/24 hours) patch 21 mg  21 mg Transdermal Daily Attiah, Nadir, MD   21 mg at 11/30/21 0752   traZODone (DESYREL) tablet 100 mg  100 mg Oral QHS Debbrah Sampedro, NP       PTA Medications: No medications prior to admission.   Musculoskeletal: Strength & Muscle Tone: within normal limits Gait & Station: normal Patient leans: N/A Psychiatric Specialty Exam:  Presentation  General Appearance: Appropriate for Environment; Fairly Groomed  Eye Contact:Good  Speech:Clear and Coherent  Speech Volume:Normal  Handedness:Right  Mood and Affect  Mood:Anxious; Depressed  Affect:Congruent  Thought Process  Thought Processes:Coherent  Duration of Psychotic Symptoms: No data recorded Past Diagnosis of Schizophrenia or Psychoactive disorder: No  Descriptions of Associations:Intact  Orientation:Full (Time, Place and Person)  Thought Content:Logical  Hallucinations:Hallucinations:  None  Ideas of Reference:Paranoia  Suicidal Thoughts:Suicidal Thoughts: No SI Active Intent and/or Plan: With Intent; With Plan  Homicidal Thoughts:Homicidal Thoughts: No   Sensorium  Memory:Immediate Good  Judgment:Fair  Insight:Fair  Executive Functions  Concentration:Fair  Attention Span:Fair  Recall:Fair  Fund of Knowledge:Fair  Language:Fair  Psychomotor Activity  Psychomotor Activity:Psychomotor Activity: Normal  Assets  Assets:Communication Skills  Sleep  Sleep:Sleep: Poor Number of Hours of Sleep: -1   Physical Exam: Physical Exam Constitutional:      Appearance: Normal appearance.  HENT:     Head: Normocephalic.     Nose: Nose normal.  Eyes:     Pupils: Pupils are equal, round, and reactive to light.  Pulmonary:     Effort: Pulmonary effort is normal.  Musculoskeletal:        General: Normal range of motion.     Cervical back: Normal range of motion.  Neurological:     Mental Status: She is alert and oriented to person, place, and time.     Sensory: No sensory deficit.     Coordination: Coordination normal.  Psychiatric:        Thought Content: Thought content normal.    Review of Systems  Constitutional: Negative.   HENT: Negative.    Eyes: Negative.   Respiratory: Negative.    Cardiovascular: Negative.   Gastrointestinal: Negative.   Genitourinary: Negative.   Musculoskeletal: Negative.   Skin: Negative.   Psychiatric/Behavioral:  Positive for depression and substance abuse. Negative for hallucinations, memory loss and suicidal ideas. The patient is nervous/anxious and has insomnia.    Blood pressure (!) 114/99, pulse 68, temperature 98.8 F (37.1 C), temperature source Oral, resp. rate 18, height  (1.549 m), weight 90.7 kg, SpO2 100 %. Body mass index is 37.79 kg/m.  Treatment Plan Summary: Daily contact with patient to assess and evaluate symptoms and progress in treatment and Medication management  Observation  Level/Precautions:  15 minute checks  Laboratory:  Labs reviewed   Psychotherapy:  Unit Group sessions  Medications:  See [facility]  Consultations:  To be determined   Discharge Concerns:  Safety, medication compliance, mood stability  Estimated LOS: 5-7 days  Other:  N/A   PLAN Safety and Monitoring: Voluntary admission to inpatient psychiatric unit for safety, stabilization and treatment Daily contact with patient to assess and evaluate symptoms and progress in treatment Patient's case to be discussed in multi-disciplinary team meeting Observation Level :  q15 minute checks Vital signs: q12 hours Precautions: Safety  Long Term Goal(s): Improvement in symptoms so as ready for discharge  Short Term Goals: Ability to identify changes in lifestyle to reduce recurrence of condition will improve, Ability to verbalize feelings will improve, Ability to disclose and discuss suicidal ideas, Ability to demonstrate self-control will improve, Ability to identify and develop effective coping behaviors will improve, Ability to maintain clinical measurements within normal limits will improve, Compliance with prescribed medications will improve, and Ability to identify triggers associated with substance abuse/mental health issues will improve  Diagnoses Principal Problem:   Bipolar I disorder, most recent episode depressed (HCC) Active Problems:   Anxiety state   Insomnia  Bipolar 1 d/o -Start Abilify 5 mg daily for mood stabilization -Discontinue Zoloft due to concerns for mania  Anxiety -Continue Hydroxyzine 25 mg every 6 hours PRN  Insomnia -Increase Trazodone to 100 mg nightly for insomnia  Nicotine dependence -Continue Nicoderm patch  transdermal Q 24 hrs  Other PRNS -Continue Tylenol 650 mg every 6 hours PRN for mild pain -Continue Maalox 30 mg every 4 hrs PRN for indigestion -Continue Milk of Magnesia as needed every 6 hrs for constipation  Discharge Planning: Social work and  case management to assist with discharge planning and identification of hospital follow-up needs prior to discharge Estimated LOS: 5-7 days Discharge Concerns: Need to establish a safety plan; Medication compliance and effectiveness Discharge Goals: Return home with outpatient referrals for mental health follow-up including medication management/psychotherapy  I certify that inpatient services furnished can reasonably be expected to improve the patient's condition.    Starleen Blue, NP 8/14/20231:15 PM

## 2021-11-30 NOTE — BH IP Treatment Plan (Signed)
Interdisciplinary Treatment and Diagnostic Plan Update  11/30/2021 Time of Session: 0830 Casey Fuentes MRN: [id number]  Principal Diagnosis: MDD (major depressive disorder)  Secondary Diagnoses: Principal Problem:   MDD (major depressive disorder)   Current Medications:  Current Facility-Administered Medications  Medication Dose Route Frequency Provider Last Rate Last Admin   acetaminophen (TYLENOL) tablet 650 mg  650 mg Oral Q6H PRN Oneta Rack, NP       alum & mag hydroxide-simeth (MAALOX/MYLANTA) 200-200-20 MG/5ML suspension 30 mL  30 mL Oral Q4H PRN Oneta Rack, NP       magnesium hydroxide (MILK OF MAGNESIA) suspension 30 mL  30 mL Oral Daily PRN Oneta Rack, NP       nicotine (NICODERM CQ - dosed in mg/24 hours) patch 21 mg  21 mg Transdermal Daily Attiah, Nadir, MD   21 mg at 11/30/21 0752   [START ON 12/01/2021] sertraline (ZOLOFT) tablet 50 mg  50 mg Oral Daily Nkwenti, Doris, NP       traZODone (DESYREL) tablet 50 mg  50 mg Oral QHS PRN Oneta Rack, NP   50 mg at 11/29/21 2142   PTA Medications: No medications prior to admission.    Patient Stressors:    Patient Strengths:    Treatment Modalities: Medication Management, Group therapy, Case management,  1 to 1 session with clinician, Psychoeducation, Recreational therapy.   Physician Treatment Plan for Primary Diagnosis: MDD (major depressive disorder) Long Term Goal(s):     Short Term Goals:    Medication Management: Evaluate patient's response, side effects, and tolerance of medication regimen.  Therapeutic Interventions: 1 to 1 sessions, Unit Group sessions and Medication administration.  Evaluation of Outcomes: Progressing  Physician Treatment Plan for Secondary Diagnosis: Principal Problem:   MDD (major depressive disorder)  Long Term Goal(s):     Short Term Goals:       Medication Management: Evaluate patient's response, side effects, and tolerance of medication  regimen.  Therapeutic Interventions: 1 to 1 sessions, Unit Group sessions and Medication administration.  Evaluation of Outcomes: Progressing   RN Treatment Plan for Primary Diagnosis: MDD (major depressive disorder) Long Term Goal(s): Knowledge of disease and therapeutic regimen to maintain health will improve  Short Term Goals: Ability to remain free from injury will improve, Ability to verbalize frustration and anger appropriately will improve, Ability to demonstrate self-control, Ability to participate in decision making will improve, Ability to verbalize feelings will improve, Ability to disclose and discuss suicidal ideas, Ability to identify and develop effective coping behaviors will improve, and Compliance with prescribed medications will improve  Medication Management: RN will administer medications as ordered by provider, will assess and evaluate patient's response and provide education to patient for prescribed medication. RN will report any adverse and/or side effects to prescribing provider.  Therapeutic Interventions: 1 on 1 counseling sessions, Psychoeducation, Medication administration, Evaluate responses to treatment, Monitor vital signs and CBGs as ordered, Perform/monitor CIWA, COWS, AIMS and Fall Risk screenings as ordered, Perform wound care treatments as ordered.  Evaluation of Outcomes: Progressing   LCSW Treatment Plan for Primary Diagnosis: MDD (major depressive disorder) Long Term Goal(s): Safe transition to appropriate next level of care at discharge, Engage patient in therapeutic group addressing interpersonal concerns.  Short Term Goals: Engage patient in aftercare planning with referrals and resources, Increase social support, Increase ability to appropriately verbalize feelings, Increase emotional regulation, Facilitate acceptance of mental health diagnosis and concerns, Facilitate patient progression through stages of change regarding substance use  diagnoses and  concerns, Identify triggers associated with mental health/substance abuse issues, and Increase skills for wellness and recovery  Therapeutic Interventions: Assess for all discharge needs, 1 to 1 time with Social worker, Explore available resources and support systems, Assess for adequacy in community support network, Educate family and significant other(s) on suicide prevention, Complete Psychosocial Assessment, Interpersonal group therapy.  Evaluation of Outcomes: Progressing   Progress in Treatment: Attending groups: Yes. Participating in groups: Yes. Taking medication as prescribed: Yes. Toleration medication: Yes. Family/Significant other contact made: No, will contact:  CSW will obtain consent to reach collateral.  Patient understands diagnosis: Yes. Discussing patient identified problems/goals with staff: Yes. Medical problems stabilized or resolved: Yes. Denies suicidal/homicidal ideation: No. Issues/concerns per patient self-inventory: Yes. Other: none  New problem(s) identified: No, Describe:  none  New Short Term/Long Term Goal(s): Patient to work towards detox, medication management for mood stabilization; elimination of SI thoughts; development of comprehensive mental wellness/sobriety plan.  Patient Goals:  Patient states their goal for treatment is to "figure out different coping skills for anxiety and depression."  Discharge Plan or Barriers: No psychosocial barriers identified at this time, patient to return to place of residence when appropriate for discharge.   Reason for Continuation of Hospitalization: Depression  Estimated Length of Stay: 1-7 days    Last 3 [place] Suicide Severity Risk Score: Flowsheet Row [facility] (Current[facility] Most recent reading at 11/29/2021  1:00 PM ED from 11/28/2021 in [facility] Most recent reading at 11/29/2021 12:54 AM ED from 11/28/2021 in [facility] EM[facility]Y [facility]PART[facility] Most recent reading at 11/28/2021  1:15 PM  C-SSRS RISK CATEGORY High Risk Moderate Risk High Risk       Last PHQ 2/9 Scores:    06/29/2019    5:39 PM 06/06/2017   11:10 AM  Depression screen PHQ 2/9  Decreased Interest 2 0  Down, Depressed, Hopeless 3 0  PHQ - 2 Score 5 0  Altered sleeping 3   Tired, decreased energy 3   Change in appetite 2   Feeling bad or failure about yourself  3   Trouble concentrating 2   Moving slowly or fidgety/restless 0   Suicidal thoughts 3   PHQ-9 Score 21   Difficult doing work/chores Very difficult     Scribe for Treatment Team: Corky Crafts, Theresia Majors 11/30/2021 11:00 AM

## 2021-11-30 NOTE — Progress Notes (Signed)
This AM, patient presents with depressed mood, guarded with minimal interaction. Patient denies SI/HI/AVH. Patient tolerated schedule medication well with no side effect noted. Patient states in her self inventory sheet that she would be open and honest with doctors, and push her self out of the comfort zone. No acute distress noted. Staff will continue to provide support to patient.  11/30/21 0800  Psych Admission Type (Psych Patients Only)  Admission Status Voluntary  Psychosocial Assessment  Patient Complaints Depression;Anxiety  Eye Contact Brief  Facial Expression Anxious  Affect Anxious  Speech Logical/coherent  Interaction Cautious  Motor Activity Fidgety  Appearance/Hygiene Unremarkable  Behavior Characteristics Cooperative  Mood Depressed  Aggressive Behavior  Effect No apparent injury  Thought Process  Coherency WDL  Content WDL  Delusions None reported or observed  Perception WDL  Hallucination None reported or observed  Judgment Limited  Confusion None  Danger to Self  Current suicidal ideation? Passive  Description of Suicide Plan No plan/ No intent  Self-Injurious Behavior No self-injurious ideation or behavior indicators observed or expressed   Agreement Not to Harm Self Yes  Description of Agreement Verbally contracts for safety  Danger to Others  Danger to Others None reported or observed

## 2021-11-30 NOTE — Group Note (Signed)
LCSW Group Therapy Note   Group Date: 11/30/2021 Start Time: 1300 End Time: 1400   Type of Therapy and Topic:  Group Therapy: Anger Cues and Responses  Participation Level:  Active  Description of Group:   In this group, patients learned how to recognize the physical, cognitive, emotional, and behavioral responses they have to anger-provoking situations.  They identified a recent time they became angry and how they reacted.  They analyzed how their reaction was possibly beneficial and how it was possibly unhelpful.  The group discussed a variety of healthier coping skills that could help with such a situation in the future.  Focus was placed on how helpful it is to recognize the underlying emotions to our anger, because working on those can lead to a more permanent solution as well as our ability to focus on the important rather than the urgent.  Therapeutic Goals: Patients will remember their last incident of anger and how they felt emotionally and physically, what their thoughts were at the time, and how they behaved. Patients will identify how their behavior at that time worked for them, as well as how it worked against them. Patients will explore possible new behaviors to use in future anger situations. Patients will learn that anger itself is normal and cannot be eliminated, and that healthier reactions can assist with resolving conflict rather than worsening situations.  Summary of Patient Progress:  Pt was present/active throughout the session and proved open to feedback from CSW and peers. Patient demonstrated good insight into the subject matter, was respectful of peers, and was present and engaged throughout the entire session.  Therapeutic Modalities:   Cognitive Behavioral Therapy  [name] 11/30/2021  7:02 PM

## 2021-11-30 NOTE — Group Note (Signed)
Recreation Therapy Group Note   Group Topic:Stress Management  Group Date: 11/30/2021 Start Time: 0930 End Time: 0950 Facilitators: Caroll Rancher, [place] Location: [address] Dayroom   Goal Area(s) Addresses:  Patient will identify positive stress management techniques. Patient will identify benefits of using stress management post d/c.  Group Description:  Meditation.  LRT played a mountain meditation.  This meditation focused on taking on the characteristics of the mountain into meditation and everyday life.  The meditation centered on how mountains go through each season, they are able to take on those changes and remain steady through it.  The meditation brings that same steadiness into everyday living.    Affect/Mood: Appropriate   Participation Level: Engaged   Participation Quality: Independent   Behavior: Appropriate   Speech/Thought Process: Focused   Insight: Good   Judgement: Good   Modes of Intervention: Meditation   Patient Response to Interventions:  Engaged   Education Outcome:  Acknowledges education and In group clarification offered    Clinical Observations/Individualized Feedback: Pt attended and participated in group activity.    Plan: Continue to engage patient in RT group sessions 2-3x/week.   Caroll Rancher, LRT,CTRS  11/30/2021 11:22 AM

## 2021-11-30 NOTE — Progress Notes (Signed)
Spiritual care group on grief and loss facilitated by chaplain Burnis Kingfisher MDiv, [facility]  Group Goal:  Support / Education around grief and loss Members engage in facilitated group support and psycho-social education.  Group Description:  Following introductions and group rules, group members engaged in facilitated group dialog and support around topic of loss, with particular support around experiences of loss in their lives. Group Identified types of loss (relationships / self / things) and identified patterns, circumstances, and changes that precipitate losses. Reflected on thoughts / feelings around loss, normalized grief responses, and recognized variety in grief experience.   Group noted Worden's four tasks of grief in discussion.  Group drew on Adlerian / Rogerian, narrative, MI, Patient Progress:  Present throughout group.  Observant of conversation as evidenced by eye contact with speakers and appropriate head nods.  Did not engage verbally.
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
## 2021-11-30 NOTE — [facility] Suicide Risk Assessment (Signed)
[facility] INPATIENT:  Family/Significant Other Suicide Prevention Education  Suicide Prevention Education:  Education Completed; Casey Fuentes, mother [phone] has been identified by the patient as the family member/significant other with whom the patient will be residing, and identified as the person(s) who will aid the patient in the event of a mental health crisis (suicidal ideations/suicide attempt).  With written consent from the patient, the family member/significant other has been provided the following suicide prevention education, prior to the and/or following the discharge of the patient.  In addition to the precautions below, patient's support agrees to monitor patient for safety, ensure treatment compliance, and alert emergency services should patient require such services.   States patient has made prior attempts to kill herself w/ OD on BP medications. States the patient told her brother's partner that she was planning on killing herself. Made plans for someone to watch her son during this time.   States she has been noticeably depressed since the birth of the patient's son 4 years ago. Noticing increased sleep, lack of appetite, isolation, lack of motivation, and social isolation.   Patient has access to brother's firearm, agrees to ensure the brother will secure firearm in gun locker.   The suicide prevention education provided includes the following: Suicide risk factors Suicide prevention and interventions National Suicide Hotline telephone number [facility] assessment telephone number [facility] Emergency Assistance 911 [facility] and/or Residential Mobile Crisis Unit telephone number  Request made of family/significant other to: Remove weapons (e.g., guns, rifles, knives), all items previously/currently identified as safety concern.   Remove drugs/medications (over-the-counter, prescriptions, illicit drugs), all items previously/currently identified as a  safety concern.  The family member/significant other verbalizes understanding of the suicide prevention education information provided.  The family member/significant other agrees to remove the items of safety concern listed above.  Corky Crafts 11/30/2021, 3:30 PM

## 2021-11-30 NOTE — Progress Notes (Signed)
D) Pt received calm, visible, participating in milieu, and in no acute distress. Pt A & O x4. Pt denies SI, HI, A/ V H, depression, anxiety and pain at this time. A) Pt encouraged to drink fluids. Pt encouraged to come to staff with needs. Pt encouraged to attend and participate in groups. Pt encouraged to set reachable goals.  R) Pt remained safe on unit, in no acute distress, will continue to assess.      11/30/21 2000  Psych Admission Type (Psych Patients Only)  Admission Status Voluntary  Psychosocial Assessment  Patient Complaints Anxiety;Depression  Eye Contact Brief  Facial Expression Anxious  Affect Anxious  Speech Logical/coherent  Interaction Cautious  Motor Activity Fidgety  Appearance/Hygiene Unremarkable  Behavior Characteristics Cooperative  Mood Sad  Thought Process  Coherency WDL  Content WDL  Delusions None reported or observed  Perception WDL  Hallucination None reported or observed  Judgment Limited  Confusion None  Danger to Self  Current suicidal ideation? Passive  Description of Suicide Plan none  Self-Injurious Behavior No self-injurious ideation or behavior indicators observed or expressed   Agreement Not to Harm Self Yes  Description of Agreement verbal  Danger to Others  Danger to Others None reported or observed

## 2021-11-30 NOTE — [facility] Suicide Risk Assessment (Signed)
Suicide Risk Assessment  Admission Assessment    [facility] Admission Suicide Risk Assessment   Nursing information obtained from:  Patient, Review of record Demographic factors:  Adolescent or young adult, Caucasian Current Mental Status:  Suicidal ideation indicated by patient, Self-harm thoughts Loss Factors:  NA Historical Factors:  Prior suicide attempts, Impulsivity Risk Reduction Factors:  Positive coping skills or problem solving skills, Living with another person, especially a relative  Total Time spent with patient: 1.5 hours Principal Problem: Bipolar I disorder, most recent episode depressed (HCC) Diagnosis:  Principal Problem:   Bipolar I disorder, most recent episode depressed (HCC) Active Problems:   Anxiety state   Insomnia  History of Present Illness: Casey Fuentes is a 26 yo Caucasian female with a reported history of postpartum depression, who presented to the [facility] ED on 11/28/21 with complaints of worsening suicidal thoughts with plans to either cut her wrists or overdose on pills. Pt was transferred to the [facility] behavioral health urgent care for continuous assessment, and subsequently transferred voluntarily to this [facility] for treatment and stabilization of her mood.    Continued Clinical Symptoms: Pt reports worsening hypersomnia, feelings of worthlessness, hopelessness, helplessness, anxiety, frustration, low energy levels, poor appetite, panic attacks & trouble with concentration and focus.  She also describes manic type symptoms which come and go, and last happened a week and a half ago; She reports that during that time, she indulged in a lot of risk taking behaviors such as binging on drugs, hypersexuality, poor sleep, but without the need for sleep, poor appetite, but not feeling hungry & high energy levels followed by a crash & depressive symptoms. She reports that episodes typically last 3-5 days followed by a depressive episode. She reports  paranoia which started a year ago, and states that she feels as though people are looking at her and talking about her. Current symptoms place pt at high risk of danger to self and continuous hospitalization is necessary to treat and stabilize her mood.   The "Alcohol Use Disorders Identification Test", Guidelines for Use in Primary Care, Second Edition.  World Science writer [facility]). Score between 0-7:  no or low risk or alcohol related problems. Score between 8-15:  moderate risk of alcohol related problems. Score between 16-19:  high risk of alcohol related problems. Score 20 or above:  warrants further diagnostic evaluation for alcohol dependence and treatment.  CLINICAL FACTORS:   Bipolar Disorder:   Depressive phase  Musculoskeletal: Strength & Muscle Tone: within normal limits Gait & Station: normal Patient leans: N/A  Psychiatric Specialty Exam:  Presentation  General Appearance: Appropriate for Environment; Fairly Groomed  Eye Contact:Good  Speech:Clear and Coherent  Speech Volume:Normal  Handedness:Right   Mood and Affect  Mood:Anxious; Depressed  Affect:Congruent   Thought Process  Thought Processes:Coherent  Descriptions of Associations:Intact  Orientation:Full (Time, Place and Person)  Thought Content:Logical  History of Schizophrenia/Schizoaffective disorder:No  Duration of Psychotic Symptoms:No data recorded Hallucinations:Hallucinations: None  Ideas of Reference:Paranoia  Suicidal Thoughts:Suicidal Thoughts: No SI Active Intent and/or Plan: With Intent; With Plan  Homicidal Thoughts:Homicidal Thoughts: No   Sensorium  Memory:Immediate Good  Judgment:Fair  Insight:Fair   Executive Functions  Concentration:Fair  Attention Span:Fair  Recall:Fair  Fund of Knowledge:Fair  Language:Fair   Psychomotor Activity  Psychomotor Activity:Psychomotor Activity: Normal   Assets  Assets:Communication Skills   Sleep  Sleep:Sleep:  Poor Number of Hours of Sleep: -1    Physical Exam: Physical Exam Constitutional:  Appearance: Normal appearance.  HENT:     Head: Normocephalic.     Nose: Nose normal.  Eyes:     Pupils: Pupils are equal, round, and reactive to light.  Pulmonary:     Effort: Pulmonary effort is normal.  Musculoskeletal:        General: Normal range of motion.     Cervical back: Normal range of motion.  Neurological:     Mental Status: She is alert.  Psychiatric:        Behavior: Behavior normal.    Review of Systems  Constitutional: Negative.   HENT: Negative.    Eyes: Negative.   Respiratory: Negative.    Cardiovascular: Negative.   Gastrointestinal: Negative.   Genitourinary: Negative.   Musculoskeletal: Negative.   Skin: Negative.   Neurological: Negative.   Psychiatric/Behavioral:  Positive for depression and substance abuse. Negative for hallucinations, memory loss and suicidal ideas. The patient is nervous/anxious and has insomnia.    Blood pressure (!) 114/99, pulse 68, temperature 98.8 F (37.1 C), temperature source Oral, resp. rate 18, height 5\' 1"  (1.549 m), weight 90.7 kg, SpO2 100 %. Body mass index is 37.79 kg/m.   COGNITIVE FEATURES THAT CONTRIBUTE TO RISK:  None    SUICIDE RISK:   Moderate:  Frequent suicidal ideation with limited intensity, and duration, some specificity in terms of plans, no associated intent, good self-control, limited dysphoria/symptomatology, some risk factors present, and identifiable protective factors, including available and accessible social support.  PLAN Safety and Monitoring: Voluntary admission to inpatient psychiatric unit for safety, stabilization and treatment Daily contact with patient to assess and evaluate symptoms and progress in treatment Patient's case to be discussed in multi-disciplinary team meeting Observation Level : q15 minute checks Vital signs: q12 hours Precautions: Safety   Long Term Goal(s): Improvement  in symptoms so as ready for discharge   Short Term Goals: Ability to identify changes in lifestyle to reduce recurrence of condition will improve, Ability to verbalize feelings will improve, Ability to disclose and discuss suicidal ideas, Ability to demonstrate self-control will improve, Ability to identify and develop effective coping behaviors will improve, Ability to maintain clinical measurements within normal limits will improve, Compliance with prescribed medications will improve, and Ability to identify triggers associated with substance abuse/mental health issues will improve   Diagnoses Principal Problem:   Bipolar I disorder, most recent episode depressed (HCC) Active Problems:   Anxiety state   Insomnia   Bipolar 1 d/o -Start Abilify 5 mg daily for mood stabilization -Discontinue Zoloft due to concerns for mania   Anxiety -Continue Hydroxyzine 25 mg every 6 hours PRN   Insomnia -Increase Trazodone to 100 mg nightly for insomnia   Nicotine dependence -Continue Nicoderm patch 21mg  transdermal Q 24 hrs   Other PRNS -Continue Tylenol 650 mg every 6 hours PRN for mild pain -Continue Maalox 30 mg every 4 hrs PRN for indigestion -Continue Milk of Magnesia as needed every 6 hrs for constipation   Discharge Planning: Social work and case management to assist with discharge planning and identification of hospital follow-up needs prior to discharge Estimated LOS: 5-7 days Discharge Concerns: Need to establish a safety plan; Medication compliance and effectiveness Discharge Goals: Return home with outpatient referrals for mental health follow-up including medication management/psychotherapy    I certify that inpatient services furnished can reasonably be expected to improve the patient's condition.   , NP 11/30/2021, 1:19 PM

## 2021-11-30 NOTE — [facility] Group Notes (Signed)
[facility] Group Notes:  (Nursing/MHT/Case Management/Adjunct)  Date:  11/30/2021  Time:  9:26 AM  Type of Therapy:  Group Therapy  Participation Level:  Active  Participation Quality:  Appropriate  Affect:  Appropriate  Cognitive:  Appropriate  Insight:  Appropriate  Engagement in Group:  Engaged  Modes of Intervention:  Discussion  Summary of Progress/Problems:  Patient attended and participated in a goals/ orientation group today. Patient's goal for today is to talk to the doctors and be honest with them.   Daneil Dan 11/30/2021, 9:26 AM

## 2021-11-30 NOTE — Plan of Care (Signed)
  Problem: Coping: Goal: Coping ability will improve Outcome: Progressing Goal: Will verbalize feelings Outcome: Progressing   Problem: Activity: Goal: Interest or engagement in leisure activities will improve Outcome: Progressing Goal: Imbalance in normal sleep/wake cycle will improve Outcome: Progressing   Problem: Safety: Goal: Ability to disclose and discuss suicidal ideas will improve Outcome: Progressing Goal: Ability to identify and utilize support systems that promote safety will improve Outcome: Progressing

## 2021-11-30 NOTE — Progress Notes (Signed)
   11/29/21 2000  Psychosocial Assessment  Patient Complaints Depression;Anxiety  Eye Contact Brief  Facial Expression Anxious  Affect Anxious;Depressed (Rates depression a 4# and anxiety a 7#)  Speech Logical/coherent  Interaction Cautious  Motor Activity Fidgety  Appearance/Hygiene Unremarkable  Behavior Characteristics Cooperative  Mood Depressed  Thought Process  Coherency WDL  Content WDL  Delusions None reported or observed  Perception WDL  Hallucination None reported or observed  Judgment Limited  Confusion None  Danger to Self  Current suicidal ideation? Passive  Description of Suicide Plan No plan/No intent  Self-Injurious Behavior No self-injurious ideation or behavior indicators observed or expressed   Agreement Not to Harm Self Yes  Danger to Others  Danger to Others None reported or observed

## 2021-12-01 ENCOUNTER — Encounter (HOSPITAL_COMMUNITY): Payer: Self-pay | Admitting: Psychiatry

## 2021-12-01 DIAGNOSIS — F1490 Cocaine use, unspecified, uncomplicated: Secondary | ICD-10-CM | POA: Diagnosis present

## 2021-12-01 DIAGNOSIS — F1210 Cannabis abuse, uncomplicated: Secondary | ICD-10-CM | POA: Diagnosis present

## 2021-12-01 LAB — CBC WITH DIFFERENTIAL/PLATELET
Abs Immature Granulocytes: 0.01 10*3/uL (ref 0.00–0.07)
Basophils Absolute: 0 10*3/uL (ref 0.0–0.1)
Basophils Relative: 1 %
Eosinophils Absolute: 0.3 10*3/uL (ref 0.0–0.5)
Eosinophils Relative: 4 %
HCT: 47.5 % — ABNORMAL HIGH (ref 36.0–46.0)
Hemoglobin: 16.2 g/dL — ABNORMAL HIGH (ref 12.0–15.0)
Immature Granulocytes: 0 %
Lymphocytes Relative: 31 %
Lymphs Abs: 2.6 10*3/uL (ref 0.7–4.0)
MCH: 31 pg (ref 26.0–34.0)
MCHC: 34.1 g/dL (ref 30.0–36.0)
MCV: 91 fL (ref 80.0–100.0)
Monocytes Absolute: 0.6 10*3/uL (ref 0.1–1.0)
Monocytes Relative: 7 %
Neutro Abs: 4.9 10*3/uL (ref 1.7–7.7)
Neutrophils Relative %: 57 %
Platelets: 269 10*3/uL (ref 150–400)
RBC: 5.22 MIL/uL — ABNORMAL HIGH (ref 3.87–5.11)
RDW: 13.2 % (ref 11.5–15.5)
WBC: 8.5 10*3/uL (ref 4.0–10.5)
nRBC: 0 % (ref 0.0–0.2)

## 2021-12-01 LAB — HEMOGLOBIN A1C
Hgb A1c MFr Bld: 4.3 % — ABNORMAL LOW (ref 4.8–5.6)
Mean Plasma Glucose: 76.71 mg/dL

## 2021-12-01 LAB — LIPID PANEL
Cholesterol: 136 mg/dL (ref 0–200)
HDL: 46 mg/dL (ref >40)
LDL Cholesterol: 83 mg/dL (ref 0–99)
Total CHOL/HDL Ratio: 3 RATIO
Triglycerides: 35 mg/dL (ref <150)
VLDL: 7 mg/dL (ref 0–40)

## 2021-12-01 LAB — VITAMIN B12: Vitamin B-12: 209 pg/mL (ref 180–914)

## 2021-12-01 LAB — VITAMIN D 25 HYDROXY (VIT D DEFICIENCY, FRACTURES): Vit D, 25-Hydroxy: 36.78 ng/mL (ref 30–100)

## 2021-12-01 LAB — TSH: TSH: 0.863 u[IU]/mL (ref 0.350–4.500)

## 2021-12-01 MED ORDER — ARIPIPRAZOLE 10 MG PO TABS
10.0000 mg | ORAL_TABLET | Freq: Every day | ORAL | Status: DC
  Administered 2021-12-02 – 2021-12-06 (×5): 10 mg via ORAL
  Filled 2021-12-01 (×6): qty 1

## 2021-12-01 MED ORDER — ESCITALOPRAM OXALATE 5 MG PO TABS
5.0000 mg | ORAL_TABLET | Freq: Every day | ORAL | Status: DC
  Administered 2021-12-01 – 2021-12-02 (×2): 5 mg via ORAL
  Filled 2021-12-01 (×4): qty 1

## 2021-12-01 NOTE — Group Note (Signed)
Recreation Therapy Group Note   Group Topic:Animal Assisted Therapy   Group Date: 12/01/2021 Start Time: 1430 End Time: 1500 Facilitators: Kensington Rios, Benito Mccreedy, LRT Location: [address] Dayroom  Animal-Assisted Activity (AAA) Program Checklist/Progress Notes Patient Eligibility Criteria Checklist & Daily Group note for Rec Tx Intervention   AAA/T Program Assumption of Risk Form signed by Patient/ or Parent Legal Guardian YES  Patient is free of allergies or severe asthma  YES  Patient reports no fear of animals YES  Patient reports no history of cruelty to animals YES  Patient understands their participation is voluntary YES  Patient washes hands before animal contact YES  Patient washes hands after animal contact YES   Group Description: Patients provided opportunity to interact with trained and credentialed [employer] Therapy dog and the community volunteer/dog handler. Patients practiced appropriate animal interaction and were educated on dog safety outside of the hospital in common community settings. Patients were encouraged to socialize with one another and share about their experiences with animals and pets. Patients participated with turn taking for dog interactions with structure imposed as needed based on number of participants and quality of spontaneous participation delivered.  Goal Area(s) Addresses:  Patient will demonstrate appropriate social skills during group session.  Patient will demonstrate ability to follow instructions during group session.  Patient will identify if a reduction in stress level occurs as a result of participation in animal assisted therapy session.    Education: Charity fundraiser, Health visitor, Communication & Social Skills   Affect/Mood: Congruent and Euphoric   Participation Level: Minimal   Participation Quality: Independent   Behavior: Attentive , Cooperative, and Reserved   Modes of Intervention: Activity, Research officer, trade union, and Socialization   Patient Response to Interventions:  Attentive and Disinterested overall   Education Outcome:  In group clarification offered    Clinical Observations/Individualized Feedback: Tytianna was mostly passive in their participation of AAA group offered. Pt was hesitant to pet the visiting therapy dog, Brianna when greeted/approached by the animal. Pt shared that they have no pets at home and have only owned a lizard in the past.    Shanon Brow, LRT, CTRS 12/01/2021 4:54 PM

## 2021-12-01 NOTE — Progress Notes (Signed)
Pt presents with flat affect and depressed mood.  Pt describes her mood as "numb."  Pt took medications without incident and no adverse reactions are noted.  RN will continue to monitor pt's progress and will assist pt as needed.  12/01/21 1215  Psych Admission Type (Psych Patients Only)  Admission Status Voluntary  Psychosocial Assessment  Patient Complaints Anxiety;Depression  Eye Contact Brief  Facial Expression Anxious  Affect Anxious  Speech Logical/coherent  Interaction Cautious  Motor Activity Fidgety  Appearance/Hygiene Unremarkable  Behavior Characteristics Cooperative  Mood Sad  Thought Process  Coherency WDL  Content WDL  Delusions None reported or observed  Perception WDL  Hallucination None reported or observed  Judgment Poor  Confusion None  Danger to Self  Current suicidal ideation? Denies  Description of Suicide Plan none  Self-Injurious Behavior No self-injurious ideation or behavior indicators observed or expressed   Agreement Not to Harm Self Yes  Description of Agreement verbal  Danger to Others  Danger to Others None reported or observed

## 2021-12-01 NOTE — Progress Notes (Addendum)
[facility]  12/01/2021 6:11 PM Casey Fuentes  MRN:  [id number]   Reason for Admission:  Casey CopperKaitlyn M. Nelda Fuentes is a 26 yo Caucasian female with a reported history of postpartum depression, who [facility] with complaints of worsening suicidal thoughts with plans to either cut her wrists or overdose on pills. [facility], and subsequently [facility] and stabilization of her mood.The patient is currently on Hospital Day 2.   Chart Review from last 24 hours:  The patient's chart was reviewed and nursing notes were reviewed. The patient's case was discussed in multidisciplinary team meeting. [facility]. Per nursing, patient is calm and cooperative and attended 3 groups: recreational therapy, goals group, and spiritual care group. The following PRN medications were given: none  Information Obtained Today During Patient Interview: The patient was seen and evaluated on the unit . On assessment, the patient feels "okay" today. Patient reports having good sleep and appetite with no issues on bowel movement, nausea, vomiting, and abdominal pain. She feels that the medications have been helpful and denies adverse effects such as restlessness or upper/lower extremity movements. She agrees to increasing Abilify and adding a low dose of Lexapro for her mood symptoms. She can recall a previous episodes where, while she was not on illicit drugs, she acted like she "was on crack" and has elevated mood and energy, pressured speech and impulsivity. She reports using marijuana daily and recently using cocaine as well. Currently denies SI, HI, AVH, delusions.    Sleep:  7.75 hours  Appetite: good   Principal Problem: Bipolar I disorder, most recent episode depressed (HCC) Diagnosis: Principal Problem:   Bipolar I  disorder, most recent episode depressed (HCC) Active Problems:   Anxiety state   Insomnia   Cocaine use   Cannabis abuse    Past Psychiatric History: See H&P  Past Medical History:  Past Medical History:  Diagnosis Date   Anxiety 10/22/2017   Chlamydia infection during pregnancy 02/03/2017   Deliberate self-cutting 10/22/2017   Age 65-18   Dyspnea    Morbid obesity (HCC) 06/01/2017   Postpartum depression 10/22/2017    Past Surgical History:  Procedure Laterality Date   CESAREAN SECTION N/A 08/02/2017   Procedure: CESAREAN SECTION;  Surgeon: Hildred Laserherry, Anika, MD;  Location: [facility] ORS;  Service: Obstetrics;  Laterality: N/A;   TONSILLECTOMY     Family History:  Family History  Problem Relation Age of Onset   Diabetes Mother    Thyroid disease Mother    Cirrhosis Mother    Healthy Father    Family Psychiatric  History: See H&P Social History: See H&P   Current Medications: Current Facility-Administered Medications  Medication Dose Route Frequency Provider Last Rate Last Admin   acetaminophen (TYLENOL) tablet 650 mg  650 mg Oral Q6H PRN Oneta RackLewis, Tanika N, NP       alum & mag hydroxide-simeth (MAALOX/MYLANTA) 200-200-20 MG/5ML suspension 30 mL  30 mL Oral Q4H PRN Oneta RackLewis, Tanika N, NP       [START ON 12/02/2021] ARIPiprazole (ABILIFY) tablet 10 mg  10 mg Oral Daily Christia ReadingBanci, Daniela, MD       escitalopram (LEXAPRO) tablet 5 mg  5 mg Oral Daily Christia ReadingBanci, Daniela, MD   5 mg at 12/01/21 1330   magnesium hydroxide (MILK OF MAGNESIA) suspension 30 mL  30 mL  Oral Daily PRN Oneta Rack, NP       nicotine (NICODERM CQ - dosed in mg/24 hours) patch 21 mg  21 mg Transdermal Daily Abbott Pao, Nadir, MD   21 mg at 12/01/21 0945   traZODone (DESYREL) tablet 100 mg  100 mg Oral QHS Starleen Blue, NP   100 mg at 11/30/21 2144    Lab Results:  Results for orders placed or performed during the hospital encounter of 11/29/21 (from the past 48 hour(s))  Lipid panel     Status: None   Collection  Time: 12/01/21  6:33 AM  Result Value Ref Range   Cholesterol 136 0 - 200 mg/dL   Triglycerides 35 <119 mg/dL   HDL 46 >14 mg/dL   Total CHOL/HDL Ratio 3.0 RATIO   VLDL 7 0 - 40 mg/dL   LDL Cholesterol 83 0 - 99 mg/dL    Comment:        Total Cholesterol/HDL:CHD Risk Coronary Heart Disease Risk Table                     Men   Women  1/2 Average Risk   3.4   3.3  Average Risk       5.0   4.4  2 X Average Risk   9.6   7.1  3 X Average Risk  23.4   11.0        Use the calculated Patient Ratio above and the CHD Risk Table to determine the patient's CHD Risk.        ATP III CLASSIFICATION (LDL):  <100     mg/dL   Optimal  782-956  mg/dL   Near or Above                    Optimal  130-159  mg/dL   Borderline  213-086  mg/dL   High  >578     mg/dL   Very High Performed at [facility], [address] [place], [place] [address]   VITAMIN D 25 Hydroxy (Vit-D Deficiency, Fractures)     Status: None   Collection Time: 12/01/21  6:33 AM  Result Value Ref Range   Vit D, 25-Hydroxy 36.78 30 - 100 ng/mL    Comment: (NOTE) Vitamin D deficiency has been defined by the Institute of Medicine  and an Endocrine Society practice guideline as a level of serum 25-OH  vitamin D less than 20 ng/mL (1,2). The Endocrine Society went on to  further define vitamin D insufficiency as a level between 21 and 29  ng/mL (2).  1. IOM (Institute of Medicine). 2010. Dietary reference intakes for  calcium and D. [place]: [employer]. 2. Holick MF, Binkley Aurora, Bischoff-Ferrari HA, et al. Evaluation,  treatment, and prevention of vitamin D deficiency: an Endocrine  Society clinical practice guideline, JCEM. 2011 Jul; 96(7): 1911-30.  Performed at [facility], [address], [place], [place] [address]   CBC with Differential/Platelet     Status: Abnormal   Collection Time: 12/01/21  6:33 AM  Result Value Ref Range   WBC 8.5 4.0 - 10.5 K/uL   RBC 5.22 (H)  3.87 - 5.11 MIL/uL   Hemoglobin 16.2 (H) 12.0 - 15.0 g/dL   HCT 13.2 (H) 44.0 - 10.2 %   MCV 91.0 80.0 - 100.0 fL   MCH 31.0 26.0 - 34.0 pg   MCHC 34.1 30.0 - 36.0 g/dL   RDW 72.5  11.5 - 15.5 %   Platelets 269 150 - 400 K/uL   nRBC 0.0 0.0 - 0.2 %   Neutrophils Relative % 57 %   Neutro Abs 4.9 1.7 - 7.7 K/uL   Lymphocytes Relative 31 %   Lymphs Abs 2.6 0.7 - 4.0 K/uL   Monocytes Relative 7 %   Monocytes Absolute 0.6 0.1 - 1.0 K/uL   Eosinophils Relative 4 %   Eosinophils Absolute 0.3 0.0 - 0.5 K/uL   Basophils Relative 1 %   Basophils Absolute 0.0 0.0 - 0.1 K/uL   Immature Granulocytes 0 %   Abs Immature Granulocytes 0.01 0.00 - 0.07 K/uL    Comment: Performed at [facility], [address] [place], [place] [address]  Hemoglobin A1c     Status: Abnormal   Collection Time: 12/01/21  6:40 AM  Result Value Ref Range   Hgb A1c MFr Bld 4.3 (L) 4.8 - 5.6 %    Comment: (NOTE) Pre diabetes:          5.7%-6.4%  Diabetes:              >6.4%  Glycemic control for   <7.0% adults with diabetes    Mean Plasma Glucose 76.71 mg/dL    Comment: Performed at [facility], [address] [place], [place] [address]  TSH     Status: None   Collection Time: 12/01/21  6:40 AM  Result Value Ref Range   TSH 0.863 0.350 - 4.500 uIU/mL    Comment: Performed by a 3rd Generation assay with a functional sensitivity of <=0.01 uIU/mL. Performed at [facility], [address] [place], [place] [address]   Vitamin B12     Status: None   Collection Time: 12/01/21  6:40 AM  Result Value Ref Range   Vitamin B-12 209 180 - 914 pg/mL    Comment: (NOTE) This assay is not validated for testing neonatal or myeloproliferative syndrome specimens for Vitamin B12 levels. Performed at [facility], [address], [place], [place] [address]     Blood Alcohol level:  Lab Results  Component Value Date   ETH <10 11/28/2021    Metabolic  Disorder Labs: Lab Results  Component Value Date   HGBA1C 4.3 (L) 12/01/2021   MPG 76.71 12/01/2021   No results found for: "PROLACTIN" Lab Results  Component Value Date   CHOL 136 12/01/2021   TRIG 35 12/01/2021   HDL 46 12/01/2021   CHOLHDL 3.0 12/01/2021   VLDL 7 12/01/2021   LDLCALC 83 12/01/2021     Musculoskeletal: Strength & Muscle Tone: within normal limits Gait & Station: normal Patient leans: N/A  Psychiatric Specialty Exam:  General Appearance: appears as stated age, casually dressed and groomed  Behavior:  cooperative and somber  Psychomotor Activity:No psychomotor agitation or retardation noted   Eye Contact: good Speech: normal amount, tone, lower volume and normal fluency   Mood: dysphoric Affect: congruent, mildly guarded  Thought Process: linear, no circumstantial or tangential thought process noted, no racing thoughts or flight of ideas Descriptions of Associations: intact Thought Content: no bizarre content, logical - reports paranoia prior to admission but denies current paranoia, ideas of reference, first rank sx Hallucinations: denies AH, VH , does not appear responding to stimuli Suicidal Thoughts: denies SI, intention, plan  Homicidal Thoughts: denies HI, intention, plan   Alertness/Orientation: alert and fully oriented  Insight: fair Judgment: Improving - compliant with meds  Memory: intact  Executive Functions  Concentration: intact  Attention Span: Fair Recall: intact Fund of Knowledge: fair    Assets  Assets:Communication Skills    Physical Exam Constitutional:      Appearance: Normal appearance.  Cardiovascular:     Rate and Rhythm: Normal rate.  Pulmonary:     Effort: Pulmonary effort is normal.  Neurological:     General: No focal deficit present.     Mental Status: Alert and oriented to person, place, and time.    Review of Systems  Constitutional: Negative.  Negative for chills, fever and weight loss.   HENT: Negative.    Eyes: Negative.   Respiratory: Negative.    Cardiovascular: Negative.   Gastrointestinal:  Negative for constipation, diarrhea, nausea and vomiting.  Genitourinary: Negative.   Musculoskeletal: Negative.   Skin: Negative.   Neurological: Negative.  Negative for tingling.    Blood pressure 101/70, pulse 92, temperature 98.2 F (36.8 C), temperature source Oral, resp. rate 18, height 5\' 1"  (1.549 m), weight 90.7 kg, SpO2 97 %. Body mass index is 37.79 kg/m.    ASSESSMENT: Bipolar d/o MRE depressed (questionable psychotic features of paranoia prior to admission) Cannabis use d/o Cocaine use - episodic (r/o stimulant use d/o)  PLAN: Safety and Monitoring:  -- Voluntary/Involuntary admission to inpatient psychiatric unit for safety, stabilization and treatment  -- Daily contact with patient to assess and evaluate symptoms and progress in treatment  -- Patient's case to be discussed in multi-disciplinary team meeting  -- Observation Level : q15 minute checks  -- Vital signs:  q12 hours  -- Precautions: suicide, elopement, and assault  2.Psychiatric Diagnoses/treatment:  Bipolar d/o MRE depressed (questionable psychotic features of paranoia prior to admission)  Increase Abilify from 5 mg to 10 mg as mood stabilizer (risks associated with atypical antipsychotic use including risk for weight gain, TD/EPS, and metabolic syndrome were again reviewed)  -- Metabolic profile and EKG monitoring obtained while on an atypical antipsychotic (BMI: 37.79 Lipid Panel: WNL HbgA1c: 4.3 QTc:399 on 8/13)   Start Lexapro 5 mg daily for depressive sx with close monitoring for mood escalation with start of antidepressant --  The risks/benefits/side-effects/alternatives to Lexapro were discussed in detail with the patient and time was given for questions. The patient consents to medication trial.    Cannabis use d/o Cocaine use - episodic (r/o stimulant use d/o) -- Counseled on need  to abstain from illicit substances after discharge -- would benefit from outpatient SA counseling after discharge    3. Medical Issues:  Pertinent labs: CMP WNL, Lipid panel WNL, Vit D WNL, B12 WNL, CBC: mildly elevated RBC,Hgb, and HCT, A1c 4.3, UDS positive THC, EKG: Qtc 399 on 8/13; B1 pending  Tobacco Use Disorder  -- Nicotine patch 21mg /24 hours ordered  -- Smoking cessation encouraged   Mildly elevated H/H   -- questionably due to hemoconcentration or nicotine use - will need f/u with PCP after discharge  6. Discharge Planning:   -- Social work and case management to assist with discharge planning and identification of hospital follow-up needs prior to discharge  -- Estimated LOS: 5-7 days  -- Discharge Concerns: Need to establish a safety plan; Medication compliance and effectiveness  -- Discharge Goals: Return home with outpatient referrals for mental health follow-up including medication management/psychotherapy      Total Time Spent in Direct Patient Care:  I personally spent 30 minutes on the unit in direct patient care. The direct patient care time included face-to-face time with the patient,  reviewing the patient's chart, communicating with other professionals, and coordinating care. Greater than 50% of this time was spent in counseling or coordinating care with the patient regarding goals of hospitalization, psycho-education, and discharge planning needs.   Christia Reading, MD PGY1 12/01/2021, 6:11 PM

## 2021-12-02 MED ORDER — ESCITALOPRAM OXALATE 5 MG PO TABS
5.0000 mg | ORAL_TABLET | Freq: Every day | ORAL | Status: DC
  Filled 2021-12-02 (×2): qty 1

## 2021-12-02 NOTE — Progress Notes (Signed)
Patient did not attend wrap up group. 

## 2021-12-02 NOTE — Plan of Care (Signed)
Patient with minimal socialization, flat affect, and mostly guarded. Patient cooperative and med compliant. Denies any pain/discomfort. Patient stated sleeping well last night and rated her depression a 1 out of 10 and anxiety 2 out of 10. Patient's goal is to work on her discharge plan today and otherwise verbalized no further questions. Denies SI, HI, and A/V/H with no plan/intent.   Problem: Health Behavior/Discharge Planning: Goal: Compliance with therapeutic regimen will improve Outcome: Progressing   Problem: Coping: Goal: Ability to identify and develop effective coping behavior will improve Outcome: Progressing

## 2021-12-02 NOTE — Progress Notes (Signed)
D) Pt received calm, visible, participating in milieu, and in no acute distress. Pt A & O x4. Pt denies SI, HI, A/ V H, depression, anxiety and pain at this time. A) Pt encouraged to drink fluids. Pt encouraged to come to staff with needs. Pt encouraged to attend and participate in groups. Pt encouraged to set reachable goals.  R) Pt remained safe on unit, in no acute distress, will continue to assess.      12/02/21 2000  Psych Admission Type (Psych Patients Only)  Admission Status Voluntary  Psychosocial Assessment  Patient Complaints None  Eye Contact Brief  Facial Expression Anxious  Affect Apathetic  Speech Logical/coherent  Interaction Minimal  Motor Activity Slow  Appearance/Hygiene Unremarkable  Behavior Characteristics Calm;Cooperative  Mood Depressed  Thought Process  Coherency WDL  Content WDL  Delusions None reported or observed  Perception WDL  Hallucination None reported or observed  Judgment Poor  Confusion None  Danger to Self  Current suicidal ideation? Denies  Self-Injurious Behavior No self-injurious ideation or behavior indicators observed or expressed   Agreement Not to Harm Self Yes  Description of Agreement verbal  Danger to Others  Danger to Others None reported or observed

## 2021-12-02 NOTE — Progress Notes (Addendum)
[facility] MD Progress Note  12/02/2021 11:01 AM Casey Fuentes  MRN:  [id number]   Reason for Admission:  Creed Copper. Travieso is a 26 yo Caucasian female with a reported history of postpartum depression, who presented to the [facility] ED on 11/28/21 with complaints of worsening suicidal thoughts with plans to either cut her wrists or overdose on pills. Pt was transferred to the [facility] behavioral health urgent care for continuous assessment, and subsequently transferred voluntarily to this [facility] for treatment and stabilization of her mood.The patient is currently on Hospital Day 3.   Chart Review from last 24 hours:  The patient's chart was reviewed and nursing notes were reviewed. The patient's case was discussed in multidisciplinary team meeting. Per [facility] was taking her medications appropriately. Per nursing, Pt presents with flat affect and depressed mood.  Pt describes her mood as "numb."  Pt took medications without incident and no adverse reactions are noted.  Patient attended 1 group pet therapy session. No PRN medications were given yesterday.    Information Obtained Today During Patient Interview: The patient was seen and evaluated on the unit . On assessment, the patient feels "good" today, stating she feels better today compared to 2 days ago. Patient reports having good sleep and appetite with no issues on bowel movement (reports last bowel movement was yesterday), nausea, vomiting, and abdominal pain. She feels that the medications have been helpful but reports feeling slightly sedated during the day.  Was agreeable to moving the Lexapro dose to nighttime.  Denies adverse effects including restlessness and headache. Patient feels the group session have been helpful but fell asleep during the session. When asked about low mood, patient reports it has been improving and most recent time had this feeling was 2 days ago.  When asked to reflect back on the SI that was present on admission,  she feels that it waxes and wanes and she does not want to act on it.  Regarding anxiety, it has been improving but is still present with triggers being her family and boyfriend when she is discharged. Denies SI, HI, AVH, delusions.    Sleep:  7.5 hours  Appetite: good   Principal Problem: Bipolar I disorder, most recent episode depressed (HCC) Diagnosis: Principal Problem:   Bipolar I disorder, most recent episode depressed (HCC) Active Problems:   Anxiety state   Insomnia   Cocaine use   Cannabis abuse    Past Psychiatric History: See H&P  Past Medical History:  Past Medical History:  Diagnosis Date   Anxiety 10/22/2017   Chlamydia infection during pregnancy 02/03/2017   Deliberate self-cutting 10/22/2017   Age 10-18   Dyspnea    Morbid obesity (HCC) 06/01/2017   Postpartum depression 10/22/2017    Past Surgical History:  Procedure Laterality Date   CESAREAN SECTION N/A 08/02/2017   Procedure: CESAREAN SECTION;  Surgeon: Hildred Laser, MD;  Location: [facility] ORS;  Service: Obstetrics;  Laterality: N/A;   TONSILLECTOMY     Family History:  Family History  Problem Relation Age of Onset   Diabetes Mother    Thyroid disease Mother    Cirrhosis Mother    Healthy Father    Family Psychiatric  History: See H&P Social History: See H&P   Current Medications: Current Facility-Administered Medications  Medication Dose Route Frequency Provider Last Rate Last Admin   acetaminophen (TYLENOL) tablet 650 mg  650 mg Oral Q6H PRN Oneta Rack, NP       alum &  mag hydroxide-simeth (MAALOX/MYLANTA) 200-200-20 MG/5ML suspension 30 mL  30 mL Oral Q4H PRN Oneta Rack, NP       ARIPiprazole (ABILIFY) tablet 10 mg  10 mg Oral Daily Christia Reading, MD   10 mg at 12/02/21 0810   escitalopram (LEXAPRO) tablet 5 mg  5 mg Oral Daily Christia Reading, MD   5 mg at 12/02/21 0810   magnesium hydroxide (MILK OF MAGNESIA) suspension 30 mL  30 mL Oral Daily PRN Oneta Rack, NP        nicotine (NICODERM CQ - dosed in mg/24 hours) patch 21 mg  21 mg Transdermal Daily Abbott Pao, Nadir, MD   21 mg at 12/02/21 0809   traZODone (DESYREL) tablet 100 mg  100 mg Oral QHS Starleen Blue, NP   100 mg at 12/01/21 2123    Lab Results:  Results for orders placed or performed during the hospital encounter of 11/29/21 (from the past 48 hour(s))  Lipid panel     Status: None   Collection Time: 12/01/21  6:33 AM  Result Value Ref Range   Cholesterol 136 0 - 200 mg/dL   Triglycerides 35 <419 mg/dL   HDL 46 >62 mg/dL   Total CHOL/HDL Ratio 3.0 RATIO   VLDL 7 0 - 40 mg/dL   LDL Cholesterol 83 0 - 99 mg/dL    Comment:        Total Cholesterol/HDL:CHD Risk Coronary Heart Disease Risk Table                     Men   Women  1/2 Average Risk   3.4   3.3  Average Risk       5.0   4.4  2 X Average Risk   9.6   7.1  3 X Average Risk  23.4   11.0        Use the calculated Patient Ratio above and the CHD Risk Table to determine the patient's CHD Risk.        ATP III CLASSIFICATION (LDL):  <100     mg/dL   Optimal  229-798  mg/dL   Near or Above                    Optimal  130-159  mg/dL   Borderline  921-194  mg/dL   High  >174     mg/dL   Very High Performed at [facility], [address] [place], [place] [address]   VITAMIN D 25 Hydroxy (Vit-D Deficiency, Fractures)     Status: None   Collection Time: 12/01/21  6:33 AM  Result Value Ref Range   Vit D, 25-Hydroxy 36.78 30 - 100 ng/mL    Comment: (NOTE) Vitamin D deficiency has been defined by the Institute of Medicine  and an Endocrine Society practice guideline as a level of serum 25-OH  vitamin D less than 20 ng/mL (1,2). The Endocrine Society went on to  further define vitamin D insufficiency as a level between 21 and 29  ng/mL (2).  1. IOM (Institute of Medicine). 2010. Dietary reference intakes for  calcium and D. [place]: [employer]. 2. Holick MF, Binkley [place], Bischoff-Ferrari  HA, et al. Evaluation,  treatment, and prevention of vitamin D deficiency: an Endocrine  Society clinical practice guideline, JCEM. 2011 Jul; 96(7): 1911-30.  Performed at [facility], [address], [place], [place] [address]   CBC with Differential/Platelet  Status: Abnormal   Collection Time: 12/01/21  6:33 AM  Result Value Ref Range   WBC 8.5 4.0 - 10.5 K/uL   RBC 5.22 (H) 3.87 - 5.11 MIL/uL   Hemoglobin 16.2 (H) 12.0 - 15.0 g/dL   HCT 77.8 (H) 24.2 - 35.3 %   MCV 91.0 80.0 - 100.0 fL   MCH 31.0 26.0 - 34.0 pg   MCHC 34.1 30.0 - 36.0 g/dL   RDW 61.4 43.1 - 54.0 %   Platelets 269 150 - 400 K/uL   nRBC 0.0 0.0 - 0.2 %   Neutrophils Relative % 57 %   Neutro Abs 4.9 1.7 - 7.7 K/uL   Lymphocytes Relative 31 %   Lymphs Abs 2.6 0.7 - 4.0 K/uL   Monocytes Relative 7 %   Monocytes Absolute 0.6 0.1 - 1.0 K/uL   Eosinophils Relative 4 %   Eosinophils Absolute 0.3 0.0 - 0.5 K/uL   Basophils Relative 1 %   Basophils Absolute 0.0 0.0 - 0.1 K/uL   Immature Granulocytes 0 %   Abs Immature Granulocytes 0.01 0.00 - 0.07 K/uL    Comment: Performed at [facility], [address], [place], [place] [address]  Hemoglobin A1c     Status: Abnormal   Collection Time: 12/01/21  6:40 AM  Result Value Ref Range   Hgb A1c MFr Bld 4.3 (L) 4.8 - 5.6 %    Comment: (NOTE) Pre diabetes:          5.7%-6.4%  Diabetes:              >6.4%  Glycemic control for   <7.0% adults with diabetes    Mean Plasma Glucose 76.71 mg/dL    Comment: Performed at [facility], [address], [place], [place] [address]  TSH     Status: None   Collection Time: 12/01/21  6:40 AM  Result Value Ref Range   TSH 0.863 0.350 - 4.500 uIU/mL    Comment: Performed by a 3rd Generation assay with a functional sensitivity of <=0.01 uIU/mL. Performed at [facility], [address] [place], [place] [address]   Vitamin B12     Status: None   Collection Time: 12/01/21   6:40 AM  Result Value Ref Range   Vitamin B-12 209 180 - 914 pg/mL    Comment: (NOTE) This assay is not validated for testing neonatal or myeloproliferative syndrome specimens for Vitamin B12 levels. Performed at [facility], [address] [place], [place] [address]     Blood Alcohol level:  Lab Results  Component Value Date   ETH <10 11/28/2021    Metabolic Disorder Labs: Lab Results  Component Value Date   HGBA1C 4.3 (L) 12/01/2021   MPG 76.71 12/01/2021   No results found for: "PROLACTIN" Lab Results  Component Value Date   CHOL 136 12/01/2021   TRIG 35 12/01/2021   HDL 46 12/01/2021   CHOLHDL 3.0 12/01/2021   VLDL 7 12/01/2021   LDLCALC 83 12/01/2021     Musculoskeletal: Strength & Muscle Tone: within normal limits Gait & Station: normal Patient leans: N/A  Psychiatric Specialty Exam:  General Appearance: appears at stated age, casually dressed dressed and groomed  Behavior: cooperative and somber  Psychomotor Activity:No psychomotor agitation or retardation noted   Eye Contact: Fair Speech: normal amount, tone, volume and fluency   Mood: dysphoric and mildly anxious- reports this has been improving Affect: at times of the assessment, she appeared  restrictive  Thought Process: linear, goal directed, no circumstantial or tangential thought process noted, no racing thoughts or flight of ideas Descriptions of Associations: intact Thought Content: no bizarre content, logical -denies current paranoia, ideas of reference, thought broadcasting  Hallucinations: denies AH, VH , does not appear responding to stimuli Delusions: No paranoia or other delusions noted Suicidal Thoughts: denies SI, intention, plan  Homicidal Thoughts: denies HI, intention, plan   Alertness/Orientation: alert and fully oriented  Insight: fair, improved Judgment: fair, improved  Memory: intact  Executive Functions  Concentration: intact  Attention Span:  Fair Recall: intact Fund of Knowledge: fair     Assets  Assets:Communication Skills    Physical Exam Constitutional:      Appearance: Normal appearance.  Cardiovascular:     Rate and Rhythm: Normal rate.  Pulmonary:     Effort: Pulmonary effort is normal.  Neurological:     General: No focal deficit present.     Mental Status: Alert and oriented to person, place, and time.    Review of Systems  Constitutional: Negative.  Negative for chills, fever and weight loss.  HENT: Negative.    Eyes: Negative.   Respiratory: Negative.    Cardiovascular: Negative.   Gastrointestinal:  Negative for constipation, diarrhea, nausea and vomiting.  Genitourinary: Negative.   Musculoskeletal: Negative.   Skin: Negative.   Neurological: Negative.  Negative for tingling.      Blood pressure (!) 112/94, pulse (!) 104, temperature 98.5 F (36.9 C), temperature source Oral, resp. rate 18, height 5\' 1"  (1.549 m), weight 90.7 kg, SpO2 97 %. Body mass index is 37.79 kg/m.    ASSESSMENT: Bipolar d/o MRE depressed (questionable psychotic features of paranoia prior to admission) Cannabis use d/o Cocaine use - episodic (r/o stimulant use d/o)  PLAN: Safety and Monitoring:  -- Voluntary/Involuntary admission to inpatient psychiatric unit for safety, stabilization and treatment  -- Daily contact with patient to assess and evaluate symptoms and progress in treatment  -- Patient's case to be discussed in multi-disciplinary team meeting  -- Observation Level : q15 minute checks  -- Vital signs:  q12 hours  -- Precautions: suicide, elopement, and assault  2.Psychiatric Diagnoses/treatment:  #Bipolar d/o MRE depressed (questionable psychotic features of paranoia prior to admission)  Continue Abilify 10 mg as mood stabilizer- consider dose titration as needed  -- Metabolic profile and EKG monitoring obtained while on an atypical antipsychotic (BMI: 37.79 Lipid Panel: WNL HbgA1c: 4.3 QTc:399 on  8/13)   Move Lexapro 5 mg daily to QHS due to sedation - close monitoring for mood escalation with start of antidepressant - consider dose titration as tolerated  -- Discussed option of PHP and she is not sure she can do this with her job   Cannabis use d/o Cocaine use - episodic (r/o stimulant use d/o) -- Counseled on need to abstain from illicit substances after discharge -- would benefit from outpatient SA counseling and CDIOP after discharge - she agrees to consider     3. Medical Issues:  Pertinent labs: CMP WNL, Lipid panel WNL, Vit D WNL, B12 WNL, CBC: mildly elevated RBC,Hgb, and HCT, A1c 4.3, UDS positive THC, EKG: Qtc 399 on 8/13; B1 pending  Tobacco Use Disorder  -- Nicotine patch 21mg /24 hours ordered  -- Smoking cessation encouraged   Mildly elevated H/H   -- questionably due to hemoconcentration or nicotine use - will need f/u with PCP after discharge  6. Discharge Planning:   -- Social work and case management to  assist with discharge planning and identification of hospital follow-up needs prior to discharge  -- Estimated LOS: 5-7 days  -- Discharge Concerns: Need to establish a safety plan; Medication compliance and effectiveness  -- Discharge Goals: Return home with outpatient referrals for mental health follow-up including medication management/psychotherapy      Total Time Spent in Direct Patient Care:  I personally spent 30 minutes on the unit in direct patient care. The direct patient care time included face-to-face time with the patient, reviewing the patient's chart, communicating with other professionals, and coordinating care. Greater than 50% of this time was spent in counseling or coordinating care with the patient regarding goals of hospitalization, psycho-education, and discharge planning needs.   Christia Reading, MD PGY1 12/02/2021, 11:01 AM

## 2021-12-02 NOTE — Group Note (Signed)
LCSW Group Therapy Note   Group Date: 12/02/2021 Start Time: 1300 End Time: 1400  Type of Therapy and Topic:  Group Therapy:  Feelings About Hospitalization  Participation Level:  Active   Description of Group This process group involved patients discussing their feelings related to being hospitalized, as well as the benefits they see to being in the hospital.  These feelings and benefits were itemized.  The group then brainstormed specific ways in which they could seek those same benefits when they discharge and return home.  Therapeutic Goals Patient will identify and describe positive and negative feelings related to hospitalization Patient will verbalize benefits of hospitalization to themselves personally Patients will brainstorm together ways they can obtain similar benefits in the outpatient setting, identify barriers to wellness and possible solutions  Summary of Patient Progress:   Pt actively engaged in a discussion about communication barriers and brainstormed ways to potentially overcome such barriers. Patient proved open to input from peers and feedback from CSW. Patient demonstrated good insight into the subject matter, was respectful of peers, and participated/remained present throughout the entire session.   Therapeutic Modalities Cognitive Behavioral Therapy Motivational Interviewing  [name] 12/02/2021  3:39 PM

## 2021-12-02 NOTE — Progress Notes (Signed)
Pt reports she had a good day and participated in pet therapy and positive affirmations. Pt rates depression 2/10 and anxiety 2/10. Pt reports a good appetite, and no physical problems. Pt denies SI/HI/AVH and verbally contracts for safety. Provided support and encouragement. Pt safe on the unit. Q 15 minute safety checks continued.

## 2021-12-03 LAB — VITAMIN B1: Vitamin B1 (Thiamine): 123.6 nmol/L (ref 66.5–200.0)

## 2021-12-03 MED ORDER — TRAZODONE HCL 100 MG PO TABS
100.0000 mg | ORAL_TABLET | Freq: Every evening | ORAL | Status: DC | PRN
  Administered 2021-12-05: 100 mg via ORAL
  Filled 2021-12-03: qty 1

## 2021-12-03 MED ORDER — ESCITALOPRAM OXALATE 10 MG PO TABS
10.0000 mg | ORAL_TABLET | Freq: Every day | ORAL | Status: DC
  Administered 2021-12-03 – 2021-12-05 (×3): 10 mg via ORAL
  Filled 2021-12-03 (×5): qty 1

## 2021-12-03 MED ORDER — TRAZODONE HCL 100 MG PO TABS
100.0000 mg | ORAL_TABLET | Freq: Every day | ORAL | Status: DC
  Filled 2021-12-03 (×2): qty 1

## 2021-12-03 MED ORDER — TRAZODONE HCL 100 MG PO TABS: 100.0000 mg | ORAL_TABLET | Freq: Every evening | ORAL | Status: DC | PRN

## 2021-12-03 NOTE — Plan of Care (Signed)
Patient denies SI, HI, and A/V/H with no plan/intent. Patient states "I'm not really feeling too depressed and I am not anxious." Patient is med compliant and is hoping her medication will no longer make her sedated with the changes made. Patient had no other questions other than asking about her discharge date. Denies pain and out in milieu observed playing cards with peers.   Problem: Health Behavior/Discharge Planning: Goal: Compliance with therapeutic regimen will improve Outcome: Progressing   Problem: Self-Concept: Goal: Level of anxiety will decrease Outcome: Progressing

## 2021-12-03 NOTE — Progress Notes (Signed)
Casey Fuentes was pleasant and cooperative.  She denied SI/HI or AVH.  She took her hs medications without difficulty.  She did complain of UTI symptoms and order obtained for UA.  Specimen cup provided and awaiting sample.  Q 15 minute checks maintained for safety   12/03/21 2130  Psych Admission Type (Psych Patients Only)  Admission Status Voluntary  Psychosocial Assessment  Patient Complaints None  Eye Contact Fair  Facial Expression Sad  Affect Depressed  Speech Logical/coherent  Interaction Minimal  Motor Activity Other (Comment) (unremarkable)  Appearance/Hygiene Unremarkable  Behavior Characteristics Cooperative;Appropriate to situation  Mood Depressed  Thought Process  Coherency WDL  Content WDL  Delusions None reported or observed  Perception WDL  Hallucination None reported or observed  Judgment Poor  Confusion None  Danger to Self  Current suicidal ideation? Denies  Danger to Others  Danger to Others None reported or observed

## 2021-12-03 NOTE — Progress Notes (Signed)
Adult Psychoeducational Group Note  Date:  12/03/2021 Time:  9:17 PM  Group Topic/Focus:  Wrap-Up Group:   The focus of this group is to help patients review their daily goal of treatment and discuss progress on daily workbooks.  Participation Level:  Active  Participation Quality:  Appropriate  Affect:  Appropriate  Cognitive:  Appropriate  Insight: Appropriate  Engagement in Group:  Engaged  Modes of Intervention:  Education and Exploration  Additional Comments:  Patient attended and participated in group tonight. She reports that she learnt it is OK to have problems and you are not alone  Scot Dock 12/03/2021, 9:17 PM

## 2021-12-03 NOTE — Progress Notes (Addendum)
[facility] MD Progress Note  12/03/2021 12:17 PM Casey Fuentes  MRN:  [id number]   Reason for Admission:  Casey Fuentes. Casey Fuentes is a 26 yo Caucasian female with a reported history of postpartum depression, who presented to the [facility] ED on 11/28/21 with complaints of worsening suicidal thoughts with plans to either cut her wrists or overdose on pills. Pt was transferred to the [facility] behavioral health urgent care for continuous assessment, and subsequently transferred voluntarily to this [facility] for treatment and stabilization of her mood.The patient is currently on Hospital Day 4.   Chart Review from last 24 hours:  The patient's chart was reviewed and nursing notes were reviewed. The patient's case was discussed in multidisciplinary team meeting. Per [facility] was taking her medications appropriately. Per nursing, patient is calm and cooperative and attended 1 group. Patient denies SI, HI, and A/V/H with no plan/intent. Patient states "I'm not really feeling too depressed and I am not anxious." Patient is med compliant and is hoping her medication will no longer make her sedated with the changes made. No PRN medications were given: none  Information Obtained Today During Patient Interview: The patient was seen and evaluated on the unit . On assessment, the patient feels "good" today. Patient reports having good sleep and appetite with no issues on bowel movement, nausea, vomiting, and abdominal pain. She feels that the medications have been helpful and denies adverse effects such as restlessness and headache. Patient feels the group session have been helpful. When asked about low mood, patient reports that she feels at baseline but reports passive SI most recently yesterday, saying "I have thoughts like I do not want to be here". She notes having passive SI when she thinks about the tension between her and her boyfriend or family.  Regarding anxiety, it has been improving, rating the severity 0 out of  10.  Denies cravings and withdrawal symptoms of illicit substances.. Denies HI, AVH, delusions, paranoia, thought broadcasting, and ideas of reference.  She is agreeable to increasing the Lexapro today and changing the trazodone from scheduled to as needed.    Sleep:  good, no recorded sleep time  Appetite: good    Principal Problem: Bipolar I disorder, most recent episode depressed (HCC) Diagnosis: Principal Problem:   Bipolar I disorder, most recent episode depressed (HCC) Active Problems:   Anxiety state   Insomnia   Cocaine use   Cannabis abuse    Past Psychiatric History: See H&P  Past Medical History:  Past Medical History:  Diagnosis Date   Anxiety 10/22/2017   Chlamydia infection during pregnancy 02/03/2017   Deliberate self-cutting 10/22/2017   Age 69-18   Dyspnea    Morbid obesity (HCC) 06/01/2017   Postpartum depression 10/22/2017    Past Surgical History:  Procedure Laterality Date   CESAREAN SECTION N/A 08/02/2017   Procedure: CESAREAN SECTION;  Surgeon: Hildred Laser, MD;  Location: [facility] ORS;  Service: Obstetrics;  Laterality: N/A;   TONSILLECTOMY     Family History:  Family History  Problem Relation Age of Onset   Diabetes Mother    Thyroid disease Mother    Cirrhosis Mother    Healthy Father    Family Psychiatric  History: See H&P Social History: See H&P   Current Medications: Current Facility-Administered Medications  Medication Dose Route Frequency Provider Last Rate Last Admin   acetaminophen (TYLENOL) tablet 650 mg  650 mg Oral Q6H PRN Oneta Rack, NP       alum &  mag hydroxide-simeth (MAALOX/MYLANTA) 200-200-20 MG/5ML suspension 30 mL  30 mL Oral Q4H PRN Oneta Rack, NP       ARIPiprazole (ABILIFY) tablet 10 mg  10 mg Oral Daily Christia Reading, MD   10 mg at 12/03/21 0749   escitalopram (LEXAPRO) tablet 10 mg  10 mg Oral QHS Christia Reading, MD       magnesium hydroxide (MILK OF MAGNESIA) suspension 30 mL  30 mL Oral Daily PRN  Oneta Rack, NP       nicotine (NICODERM CQ - dosed in mg/24 hours) patch 21 mg  21 mg Transdermal Daily Attiah, Nadir, MD   21 mg at 12/03/21 0749   traZODone (DESYREL) tablet 100 mg  100 mg Oral QHS PRN Christia Reading, MD        Lab Results:  No results found for this or any previous visit (from the past 48 hour(s)).   Blood Alcohol level:  Lab Results  Component Value Date   ETH <10 11/28/2021    Metabolic Disorder Labs: Lab Results  Component Value Date   HGBA1C 4.3 (L) 12/01/2021   MPG 76.71 12/01/2021   No results found for: "PROLACTIN" Lab Results  Component Value Date   CHOL 136 12/01/2021   TRIG 35 12/01/2021   HDL 46 12/01/2021   CHOLHDL 3.0 12/01/2021   VLDL 7 12/01/2021   LDLCALC 83 12/01/2021     Musculoskeletal: Strength & Muscle Tone: within normal limits Gait & Station: normal Patient leans: N/A  Psychiatric Specialty Exam:  General Appearance: appears at stated age, casually dressed and groomed  Behavior: cooperative  Psychomotor Activity: no psychomotor agitation or retardation noted   Eye Contact: fair  Speech: normal amount, tone, volume and fluency, somewhat sluggish   Mood: described as "good" but appears somber Affect: noncongruent and moderately interactive - can smile intermittently on exam  Thought Process: linear, goal directed, no circumstantial or tangential thought process noted, no racing thoughts or flight of ideas Descriptions of Associations: intact Thought Content: no bizarre content, logical  Hallucinations: denies AH, VH , does not appear responding to stimuli Delusions: no paranoia, delusions of control, grandeur, ideas of reference, thought broadcasting, and magical thinking Suicidal Thoughts: reports passive SI yesterday but denies intention, plan or SI today and contracts for safety on the unit Homicidal Thoughts: denies HI, intention, plan   Alertness/Orientation: alert and fully oriented  Insight: fair   Judgment: fair -can work on verbalizing more coping skills  Memory: intact  Art therapist  Concentration: intact  Attention Span: fair Recall: intact Fund of Knowledge: fair      Assets  Assets:Communication Skills    Physical Exam Constitutional:      Appearance: Normal appearance.  Cardiovascular:     Rate and Rhythm: Normal rate.  Pulmonary:     Effort: Pulmonary effort is normal.  Neurological:     General: No focal deficit present.     Mental Status: Alert and oriented to person, place, and time.    Review of Systems  Constitutional: Negative.  Negative for chills, fever and weight loss.  HENT: Negative.    Eyes: Negative.   Respiratory: Negative.    Cardiovascular: Negative.   Gastrointestinal:  Negative for constipation, diarrhea, nausea and vomiting.  Genitourinary: Negative.   Musculoskeletal: Negative.   Skin: Negative.   Neurological: Negative.  Negative for tingling.        Blood pressure (!) 94/55, pulse (!) 53, temperature 98.9 F (37.2 C), temperature source Oral, resp.  rate 18, height 5\' 1"  (1.549 m), weight 90.7 kg, SpO2 95 %. Body mass index is 37.79 kg/m.    ASSESSMENT: Bipolar d/o MRE depressed (questionable psychotic features of paranoia prior to admission) Cannabis use d/o Cocaine use - episodic (r/o stimulant use d/o)  PLAN: Safety and Monitoring:  -- Voluntary/Involuntary admission to inpatient psychiatric unit for safety, stabilization and treatment  -- Daily contact with patient to assess and evaluate symptoms and progress in treatment  -- Patient's case to be discussed in multi-disciplinary team meeting  -- Observation Level : q15 minute checks  -- Vital signs:  q12 hours  -- Precautions: suicide, elopement, and assault  2.Psychiatric Diagnoses/treatment:  #Bipolar d/o MRE depressed (questionable psychotic features of paranoia prior to admission)  Continue Abilify 10 mg as mood stabilizer- consider dose titration  as needed  -- Metabolic profile and EKG monitoring obtained while on an atypical antipsychotic (BMI: 37.79 Lipid Panel: WNL HbgA1c: 4.3 QTc:399 on 8/13)   Increase Lexapro to 10 mg and move to QHS due to oversedation during the AM and passive SI  - close monitoring for mood escalation with start of antidepressant - consider dose titration as tolerated  -Change trazodone 100 mg QHS to QHS PRN for insomnia to avoid daytime sedation and given low BP today   Cannabis use d/o Cocaine use - episodic (r/o stimulant use d/o) -- Counseled on need to abstain from illicit substances after discharge -- would benefit from outpatient SA counseling after discharge and agrees to this with [facility]    3. Medical Issues:  Pertinent labs: CMP WNL, Lipid panel WNL, Vit D WNL, B12 WNL, CBC: mildly elevated RBC,Hgb, and HCT, A1c 4.3, UDS positive THC, EKG: Qtc 399 on 8/13; vit B1 123.6  Tobacco Use Disorder  -- Nicotine patch 21mg /24 hours ordered  -- Smoking cessation encouraged   Mildly elevated H/H   -- questionably due to hemoconcentration or nicotine use - will need f/u with PCP after discharge  6. Discharge Planning:   -- Social work and case management to assist with discharge planning and identification of hospital follow-up needs prior to discharge  -- Estimated LOS: 5-7 days  -- Discharge Concerns: Need to establish a safety plan; Medication compliance and effectiveness  -- Discharge Goals: Return home with outpatient referrals for mental health follow-up including medication management/psychotherapy      Total Time Spent in Direct Patient Care:  I personally spent 30 minutes on the unit in direct patient care. The direct patient care time included face-to-face time with the patient, reviewing the patient's chart, communicating with other professionals, and coordinating care. Greater than 50% of this time was spent in counseling or coordinating care with the patient regarding goals of  hospitalization, psycho-education, and discharge planning needs.   , MD PGY1 12/03/2021, 12:17 PM

## 2021-12-03 NOTE — [facility] Group Notes (Signed)
[facility] Group Notes:  (Nursing/MHT/Case Management/Adjunct)  Date:  12/03/2021  Time:  11:35 AM  Type of Therapy:  Group Therapy  Participation Level:  Active  Participation Quality:  Appropriate  Affect:  Appropriate  Cognitive:  Appropriate  Insight:  Appropriate  Engagement in Group:  Engaged  Modes of Intervention:  Discussion  Summary of Progress/Problems:  Patient attended and participated in goals group today. Patient's goal for today is to stay out of her room and stay awake for groups.   Daneil Dan 12/03/2021, 11:35 AM

## 2021-12-04 ENCOUNTER — Encounter (HOSPITAL_COMMUNITY): Payer: Self-pay

## 2021-12-04 LAB — URINALYSIS, COMPLETE (UACMP) WITH MICROSCOPIC
Bilirubin Urine: NEGATIVE
Glucose, UA: NEGATIVE mg/dL
Hgb urine dipstick: NEGATIVE
Ketones, ur: NEGATIVE mg/dL
Leukocytes,Ua: NEGATIVE
Nitrite: NEGATIVE
Protein, ur: NEGATIVE mg/dL
Specific Gravity, Urine: 1.008 (ref 1.005–1.030)
pH: 8 (ref 5.0–8.0)

## 2021-12-04 NOTE — Plan of Care (Signed)
Nurse discussed anxiety, depression and coping skills with patient.  

## 2021-12-04 NOTE — Progress Notes (Signed)
Patient stated she does not feel any kind of emotion today and feels that she is just floating around.

## 2021-12-04 NOTE — Group Note (Signed)
Date:  12/04/2021 Time:  10:16 AM  Group Topic/Focus:  Orientation:   The focus of this group is to educate the patient on the purpose and policies of crisis stabilization and provide a format to answer questions about their admission.  The group details unit policies and expectations of patients while admitted.    Participation Level:  Active  Participation Quality:  Appropriate  Affect:  Appropriate  Cognitive:  Appropriate  Insight: Appropriate  Engagement in Group:  Engaged  Modes of Intervention:  Discussion  Additional Comments:     LONDA MACKOWSKI 12/04/2021, 10:16 AM

## 2021-12-04 NOTE — BH IP Treatment Plan (Signed)
Interdisciplinary Treatment and Diagnostic Plan Update  12/04/2021 Time of Session: 10:00am  Casey Fuentes MRN: [id number]  Principal Diagnosis: Bipolar I disorder, most recent episode depressed (HCC)  Secondary Diagnoses: Principal Problem:   Bipolar I disorder, most recent episode depressed (HCC) Active Problems:   Anxiety state   Insomnia   Cocaine use   Cannabis abuse   Current Medications:  Current Facility-Administered Medications  Medication Dose Route Frequency Provider Last Rate Last Admin   acetaminophen (TYLENOL) tablet 650 mg  650 mg Oral Q6H PRN Oneta Rack, NP       alum & mag hydroxide-simeth (MAALOX/MYLANTA) 200-200-20 MG/5ML suspension 30 mL  30 mL Oral Q4H PRN Oneta Rack, NP       ARIPiprazole (ABILIFY) tablet 10 mg  10 mg Oral Daily Christia Reading, MD   10 mg at 12/04/21 0900   escitalopram (LEXAPRO) tablet 10 mg  10 mg Oral QHS Christia Reading, MD   10 mg at 12/03/21 2130   magnesium hydroxide (MILK OF MAGNESIA) suspension 30 mL  30 mL Oral Daily PRN Oneta Rack, NP       nicotine (NICODERM CQ - dosed in mg/24 hours) patch 21 mg  21 mg Transdermal Daily Attiah, Nadir, MD   21 mg at 12/03/21 0749   traZODone (DESYREL) tablet 100 mg  100 mg Oral QHS PRN Christia Reading, MD       PTA Medications: No medications prior to admission.    Patient Stressors:    Patient Strengths:    Treatment Modalities: Medication Management, Group therapy, Case management,  1 to 1 session with clinician, Psychoeducation, Recreational therapy.   Physician Treatment Plan for Primary Diagnosis: Bipolar I disorder, most recent episode depressed (HCC) Long Term Goal(s): Improvement in symptoms so as ready for discharge   Short Term Goals: Ability to identify changes in lifestyle to reduce recurrence of condition will improve Ability to verbalize feelings will improve Ability to disclose and discuss suicidal ideas Ability to demonstrate self-control will  improve Ability to identify and develop effective coping behaviors will improve Ability to maintain clinical measurements within normal limits will improve Compliance with prescribed medications will improve Ability to identify triggers associated with substance abuse/mental health issues will improve  Medication Management: Evaluate patient's response, side effects, and tolerance of medication regimen.  Therapeutic Interventions: 1 to 1 sessions, Unit Group sessions and Medication administration.  Evaluation of Outcomes: Progressing  Physician Treatment Plan for Secondary Diagnosis: Principal Problem:   Bipolar I disorder, most recent episode depressed (HCC) Active Problems:   Anxiety state   Insomnia   Cocaine use   Cannabis abuse  Long Term Goal(s): Improvement in symptoms so as ready for discharge   Short Term Goals: Ability to identify changes in lifestyle to reduce recurrence of condition will improve Ability to verbalize feelings will improve Ability to disclose and discuss suicidal ideas Ability to demonstrate self-control will improve Ability to identify and develop effective coping behaviors will improve Ability to maintain clinical measurements within normal limits will improve Compliance with prescribed medications will improve Ability to identify triggers associated with substance abuse/mental health issues will improve     Medication Management: Evaluate patient's response, side effects, and tolerance of medication regimen.  Therapeutic Interventions: 1 to 1 sessions, Unit Group sessions and Medication administration.  Evaluation of Outcomes: Progressing   RN Treatment Plan for Primary Diagnosis: Bipolar I disorder, most recent episode depressed (HCC) Long Term Goal(s): Knowledge of disease and therapeutic regimen to maintain  health will improve  Short Term Goals: Ability to remain free from injury will improve, Ability to participate in decision making will  improve, Ability to verbalize feelings will improve, Ability to disclose and discuss suicidal ideas, and Ability to identify and develop effective coping behaviors will improve  Medication Management: RN will administer medications as ordered by provider, will assess and evaluate patient's response and provide education to patient for prescribed medication. RN will report any adverse and/or side effects to prescribing provider.  Therapeutic Interventions: 1 on 1 counseling sessions, Psychoeducation, Medication administration, Evaluate responses to treatment, Monitor vital signs and CBGs as ordered, Perform/monitor CIWA, COWS, AIMS and Fall Risk screenings as ordered, Perform wound care treatments as ordered.  Evaluation of Outcomes: Progressing   LCSW Treatment Plan for Primary Diagnosis: Bipolar I disorder, most recent episode depressed (HCC) Long Term Goal(s): Safe transition to appropriate next level of care at discharge, Engage patient in therapeutic group addressing interpersonal concerns.  Short Term Goals: Engage patient in aftercare planning with referrals and resources, Increase social support, Increase emotional regulation, Facilitate acceptance of mental health diagnosis and concerns, Identify triggers associated with mental health/substance abuse issues, and Increase skills for wellness and recovery  Therapeutic Interventions: Assess for all discharge needs, 1 to 1 time with Social worker, Explore available resources and support systems, Assess for adequacy in community support network, Educate family and significant other(s) on suicide prevention, Complete Psychosocial Assessment, Interpersonal group therapy.  Evaluation of Outcomes: Progressing   Progress in Treatment: Attending groups: Yes. Participating in groups: Yes. Taking medication as prescribed: Yes. Toleration medication: Yes. Family/Significant other contact made: Yes, will contact:  Mother  Patient understands  diagnosis: Yes. Discussing patient identified problems/goals with staff: Yes. Medical problems stabilized or resolved: Yes. Denies suicidal/homicidal ideation: No. Issues/concerns per patient self-inventory: Yes. Other: none   New problem(s) identified: No, Describe:  none   New Short Term/Long Term Goal(s): Patient to work towards detox, medication management for mood stabilization; elimination of SI thoughts; development of comprehensive mental wellness/sobriety plan.   Patient Goals:  Patient states their goal for treatment is to "figure out different coping skills for anxiety and depression."   Discharge Plan or Barriers: No psychosocial barriers identified at this time, patient to return to place of residence when appropriate for discharge.    Reason for Continuation of Hospitalization: Depression   Estimated Length of Stay: 1-7 days        Last 3 [place] Suicide Severity Risk Score: Flowsheet Row [facility] Most recent reading at 11/29/2021  1:00 PM ED from 11/28/2021 in [facility] Most recent reading at 11/29/2021 12:54 AM ED from 11/28/2021 in [facility] EMERGENCY DEPARTMENT Most recent reading at 11/28/2021  1:15 PM  C-SSRS RISK CATEGORY High Risk Moderate Risk High Risk       Last PHQ 2/9 Scores:    06/29/2019    5:39 PM 06/06/2017   11:10 AM  Depression screen PHQ 2/9  Decreased Interest 2 0  Down, Depressed, Hopeless 3 0  PHQ - 2 Score 5 0  Altered sleeping 3   Tired, decreased energy 3   Change in appetite 2   Feeling bad or failure about yourself  3   Trouble concentrating 2   Moving slowly or fidgety/restless 0   Suicidal thoughts 3   PHQ-9 Score 21   Difficult doing work/chores Very difficult     Scribe for Treatment Team:  Aram Beecham, LCSWA 12/04/2021 10:42 AM

## 2021-12-04 NOTE — [facility] Group Notes (Signed)
[facility] Group Notes:  (Nursing/MHT/Case Management/Adjunct)  Date:  12/04/2021  Time:  2:40 PM  Type of Therapy:  Group Therapy   Participation Level:  Active   Participation Quality:  Appropriate   Affect:  Appropriate   Cognitive:  Appropriate   Insight:  Appropriate   Engagement in Group:  Engaged   Modes of Intervention:  Discussion   Summary of Progress/Problems:   Patient attended and participated in a relaxation group that focuses on deep breathing and muscle relaxation.   Daneil Dan 12/04/2021, 2:40 PM

## 2021-12-04 NOTE — Progress Notes (Signed)
D:  Patient's self inventory sheet, patient sleeps good, no sleep medication, sleep medication helpful.  Good appetite, normal energy level, good concentration.  Denied depression, hopeless and anxiety.  Denied withdrawals.  Denied SI.  Denied physical problems.  Denied physical pain.  Goal is maintain positivity.  Will think happy thoughts.  No discharge plans. A:  Medications administered per MD orders.  Emotional support and encouragement given patient. R:  Denied SI and Hi, contracts for safety.  Denied A/V hallucinations.  Safety maintained with 15 minute checks.

## 2021-12-04 NOTE — Group Note (Signed)
LCSW Group Therapy Note   Group Date: 12/04/2021 Start Time: 1300 End Time: 1400     Type of Therapy and Topic:  Group Therapy:  triggers  Participation Level:  minimal  Description of Group:   Recognizing Triggers: Patients defined triggers and discussed the importance of recognizing their personal warning signs. Patients identified their own triggers and how they tend to cope with stressful situations. Patients discussed areas such as people, places, things, and thoughts that rigger certain emotions for them. CSW provided support to patients and discussed safety planning for when these triggers occur. Group participants had opportunities to share openly with the group and participate in a group discussion while providing support and feedback to their peers.  Therapeutic Goals: Patient will identify triggers that are contributing to a problem in their life Patient will identify unwanted behaviors and feelings associated with a trigger.  Patient will share with other group members strategies to confront and avoid triggers so that they may be able to react appropriately to triggers in daily life.    Summary of Patient Progress:  Minimal participation    Therapeutic Modalities:   Cognitive Behavioral Therapy Solution Focused Therapy Motivational Interviewing Family Systems Approach       Marinda Elk, [place] 12/04/2021  2:31 PM

## 2021-12-04 NOTE — Progress Notes (Addendum)
[facility] MD Progress Note  12/04/2021 8:55 AM Casey Fuentes  MRN:  [id number]   Reason for Admission:  Casey Fuentes is a 26 yo Caucasian female with a reported history of postpartum depression, who presented to the [facility] ED on 11/28/21 with complaints of worsening suicidal thoughts with plans to either cut her wrists or overdose on pills. Pt was transferred to the [facility] behavioral health urgent care for continuous assessment, and subsequently transferred voluntarily to this [facility] for treatment and stabilization of her mood.The patient is currently on Hospital Day 5.   Chart Review from last 24 hours:  The patient's chart was reviewed and nursing notes were reviewed. The patient's case was discussed in multidisciplinary team meeting. Per [employer], patient was taking medications appropriately. Per nursing, patient is pleasant and cooperative and attended 1 group. She complained of UTI sxms and UA was ordered. No PRN medications were given.   Information Obtained Today During Patient Interview: The patient was seen and evaluated on the unit . On assessment, the patient feels "good" today. Patient reports having good sleep and lower appetite, but still eats her food. Denies issues with bowel movement, nausea, vomiting, and abdominal pain. She feels that the medications have been helpful and denies adverse effects such as restlessness, stiffness, and HA. She reports some pain when urinating yesterday but feels that she was not hydrating. Encouraged the patient to stay hydrated and monitor symptoms today. When asked about low mood, patient reports it has been improving and most recent time had this feeling was 2 days ago. Regarding anxiety, it has been improving and is back at baseline. Denies SI, HI, AVH, delusions, paranoia, thought broadcasting, and ideas of reference.  Sleep:  good  Appetite: fair    Principal Problem: Bipolar I disorder, most recent episode depressed  (HCC) Diagnosis: Principal Problem:   Bipolar I disorder, most recent episode depressed (HCC) Active Problems:   Anxiety state   Insomnia   Cocaine use   Cannabis abuse    Past Psychiatric History: See H&P  Past Medical History:  Past Medical History:  Diagnosis Date   Anxiety 10/22/2017   Chlamydia infection during pregnancy 02/03/2017   Deliberate self-cutting 10/22/2017   Age 64-18   Dyspnea    Morbid obesity (HCC) 06/01/2017   Postpartum depression 10/22/2017    Past Surgical History:  Procedure Laterality Date   CESAREAN SECTION N/A 08/02/2017   Procedure: CESAREAN SECTION;  Surgeon: Hildred Laser, MD;  Location: [facility] ORS;  Service: Obstetrics;  Laterality: N/A;   TONSILLECTOMY     Family History:  Family History  Problem Relation Age of Onset   Diabetes Mother    Thyroid disease Mother    Cirrhosis Mother    Healthy Father    Family Psychiatric  History: See H&P Social History: See H&P   Current Medications: Current Facility-Administered Medications  Medication Dose Route Frequency Provider Last Rate Last Admin   acetaminophen (TYLENOL) tablet 650 mg  650 mg Oral Q6H PRN Oneta Rack, NP       alum & mag hydroxide-simeth (MAALOX/MYLANTA) 200-200-20 MG/5ML suspension 30 mL  30 mL Oral Q4H PRN Oneta Rack, NP       ARIPiprazole (ABILIFY) tablet 10 mg  10 mg Oral Daily Christia Reading, MD   10 mg at 12/03/21 0749   escitalopram (LEXAPRO) tablet 10 mg  10 mg Oral QHS Christia Reading, MD   10 mg at 12/03/21 2130   magnesium hydroxide (MILK OF  MAGNESIA) suspension 30 mL  30 mL Oral Daily PRN Oneta Rack, NP       nicotine (NICODERM CQ - dosed in mg/24 hours) patch 21 mg  21 mg Transdermal Daily Abbott Pao, Nadir, MD   21 mg at 12/03/21 0749   traZODone (DESYREL) tablet 100 mg  100 mg Oral QHS PRN Christia Reading, MD        Lab Results:  Results for orders placed or performed during the hospital encounter of 11/29/21 (from the past 48 hour(s))  Urinalysis,  Complete w Microscopic Urine, Clean Catch     Status: Abnormal   Collection Time: 12/03/21  7:20 AM  Result Value Ref Range   Color, Urine STRAW (A) YELLOW   APPearance CLEAR CLEAR   Specific Gravity, Urine 1.008 1.005 - 1.030   pH 8.0 5.0 - 8.0   Glucose, UA NEGATIVE NEGATIVE mg/dL   Hgb urine dipstick NEGATIVE NEGATIVE   Bilirubin Urine NEGATIVE NEGATIVE   Ketones, ur NEGATIVE NEGATIVE mg/dL   Protein, ur NEGATIVE NEGATIVE mg/dL   Nitrite NEGATIVE NEGATIVE   Leukocytes,Ua NEGATIVE NEGATIVE   RBC / HPF 0-5 0 - 5 RBC/hpf   WBC, UA 0-5 0 - 5 WBC/hpf   Bacteria, UA RARE (A) NONE SEEN   Squamous Epithelial / LPF 0-5 0 - 5    Comment: Performed at [facility], [address], [place], [place] [address]     Blood Alcohol level:  Lab Results  Component Value Date   ETH <10 11/28/2021    Metabolic Disorder Labs: Lab Results  Component Value Date   HGBA1C 4.3 (L) 12/01/2021   MPG 76.71 12/01/2021   No results found for: "PROLACTIN" Lab Results  Component Value Date   CHOL 136 12/01/2021   TRIG 35 12/01/2021   HDL 46 12/01/2021   CHOLHDL 3.0 12/01/2021   VLDL 7 12/01/2021   LDLCALC 83 12/01/2021     Musculoskeletal: Strength & Muscle Tone: within normal limits Gait & Station: normal Patient leans: N/A  Psychiatric Specialty Exam:  General Appearance: appears at stated age, casually dressed and groomed  Behavior: pleasant and cooperative  Psychomotor Activity: no psychomotor agitation or retardation noted   Eye Contact: good Speech: normal amount, tone, volume and fluency   Mood: euthymic, reports feeling "good" Affect: congruent, more pleasant and interactive today and appears less somber  Thought Process: linear, goal directed, no circumstantial or tangential thought process noted, no racing thoughts or flight of ideas Descriptions of Associations: intact Thought Content: no bizarre content, logical and future-oriented Hallucinations:  denies AH, VH , does not appear responding to stimuli Delusions: no paranoia, delusions of control, grandeur, ideas of reference, thought broadcasting, and magical thinking Suicidal Thoughts: denies SI today (most recent time having passive SI was 2 days ago), intention, plan  Homicidal Thoughts: denies HI, intention, plan   Alertness/Orientation: alert and fully oriented  Insight: fair Judgment: fair  Memory: intact  Executive Functions  Concentration: intact  Attention Span: fair Recall: intact Fund of Knowledge: fair  Assets  Assets:Communication Skills  Physical Exam Constitutional:      Appearance: Normal appearance.  Cardiovascular:     Rate and Rhythm: Normal rate.  Pulmonary:     Effort: Pulmonary effort is normal.  Neurological:     General: No focal deficit present.     Mental Status: Alert and oriented to person, place, and time.    Review of Systems  Constitutional: Negative.  Negative for chills, fever and weight  loss.  HENT: Negative.    Eyes: Negative.   Respiratory: Negative.    Cardiovascular: Negative.   Gastrointestinal:  Negative for constipation, diarrhea, nausea and vomiting.  Genitourinary: Yesterday, positive for dysuria; Negative for increased urinary frequency and urgency   Musculoskeletal: Negative.   Skin: Negative.   Neurological: Negative.  Negative for tingling.       Blood pressure 105/69, pulse 93, temperature 98.4 F (36.9 C), temperature source Oral, resp. rate 18, height 5\' 1"  (1.549 m), weight 90.7 kg, SpO2 98 %. Body mass index is 37.79 kg/m.    ASSESSMENT: Bipolar d/o MRE depressed (questionable psychotic features of paranoia prior to admission) Cannabis use d/o Cocaine use - episodic (r/o stimulant use d/o)  PLAN: Safety and Monitoring:  -- Voluntary/Involuntary admission to inpatient psychiatric unit for safety, stabilization and treatment  -- Daily contact with patient to assess and evaluate symptoms and progress  in treatment  -- Patient's case to be discussed in multi-disciplinary team meeting  -- Observation Level : q15 minute checks  -- Vital signs:  q12 hours  -- Precautions: suicide, elopement, and assault  2.Psychiatric Diagnoses/treatment:  #Bipolar d/o MRE depressed (questionable psychotic features of paranoia prior to admission)  Continue Abilify 10 mg as mood stabilizer- consider dose titration as needed  -- Metabolic profile and EKG monitoring obtained while on an atypical antipsychotic (BMI: 37.79 Lipid Panel: WNL HbgA1c: 4.3 QTc:399 on 8/13)   Continue Lexapro to 10 mg QHS due to oversedation during the AM - close monitoring for mood escalation with start of antidepressant   -PRN: trazodone 100 mg QHS to QHS PRN for insomnia   Cannabis use d/o Cocaine use - episodic (r/o stimulant use d/o) -- Counseled on need to abstain from illicit substances after discharge -- would benefit from outpatient SA counseling after discharge and agrees to this with [facility]    3. Medical Issues:  Pertinent labs: CMP WNL, Lipid panel WNL, Vit D WNL, B12 WNL, CBC: mildly elevated RBC,Hgb, and HCT, A1c 4.3, UDS positive THC, EKG: Qtc 399 on 8/13; vit B1 123.6, UA WNL except rare bacteria  Tobacco Use Disorder  -- Nicotine patch 21mg /24 hours ordered  -- Smoking cessation encouraged   Mildly elevated H/H   -- questionably due to hemoconcentration or nicotine use - will need f/u with PCP after discharge  6. Discharge Planning:   -- Social work and case management to assist with discharge planning and identification of hospital follow-up needs prior to discharge  -- Estimated LOS: 5-7 days  -- Discharge Concerns: Need to establish a safety plan; Medication compliance and effectiveness  -- Discharge Goals: Return home with outpatient referrals for mental health follow-up including medication management/psychotherapy      Total Time Spent in Direct Patient Care:  I personally spent 30  minutes on the unit in direct patient care. The direct patient care time included face-to-face time with the patient, reviewing the patient's chart, communicating with other professionals, and coordinating care. Greater than 50% of this time was spent in counseling or coordinating care with the patient regarding goals of hospitalization, psycho-education, and discharge planning needs.   , MD PGY1 12/04/2021, 8:55 AM

## 2021-12-05 NOTE — Group Note (Addendum)
  [facility] LCSW Group Therapy Note  Date/Time:  12/05/2021 10:00am-11:00am or 11:00am-12:00pm  Type of Therapy and Topic:  Group Therapy:  Self-Care after Hospitalization  Participation Level:  Minimal   Description of Group This process group involved patients discussing how they plan to take care of themselves in a better manner when they get home from the hospital.  The group started with patients listing one healthy self-care they hope to engage in at discharge that they did not use prior to admission.  We discussed a variety of other means of self-care which had a large range from hygiene activities to eating to setting boundaries to participating in peer support groups.  The primary focus by CSW was to point out commonalities.  When there were participants who stated everyone else can get help, but they themselves are "beyond help," this was discussed in detail and this belief was gently challenged.  Finally, it was announced that immediately following group was to be "Hygiene Hour" where everyone would go to their rooms and take care of their personal hygiene.  This was met with wide acceptance.  Therapeutic Goals Patient will identify and describe one self-care activity to deliberately plan to use upon hospital discharge Patient will participate in generating additional ideas about healthy self-care options when they return to the community Patients will be supportive of one another and receive support from others Patients will be challenged to realize that they are not "beyond help" any more than other participants in the room are  Summary of Patient Progress:  The patient expressed that one self-care activity she hopes to use post-discharge is to follow up with a therapist.  Other than this, the patient spoke very little and only when she was called on directly.   Therapeutic Modalities Brief Solution-Focused Therapy Psychoeducation   Selmer Dominion, LCSW 12/05/2021, 5:00 PM

## 2021-12-05 NOTE — Progress Notes (Signed)
   12/05/21 2244  Psych Admission Type (Psych Patients Only)  Admission Status Voluntary  Psychosocial Assessment  Patient Complaints None  Eye Contact Fair  Facial Expression Flat  Affect Appropriate to circumstance  Speech Logical/coherent  Interaction Minimal  Motor Activity Other (Comment) (WDL)  Appearance/Hygiene Unremarkable  Behavior Characteristics Appropriate to situation  Mood Pleasant  Thought Process  Coherency WDL  Content WDL  Delusions None reported or observed  Perception WDL  Hallucination None reported or observed  Judgment Limited  Confusion None  Danger to Self  Current suicidal ideation? Denies  Self-Injurious Behavior No self-injurious ideation or behavior indicators observed or expressed   Agreement Not to Harm Self Yes  Description of Agreement verbal  Danger to Others  Danger to Others None reported or observed

## 2021-12-05 NOTE — Progress Notes (Signed)
D: Pt alert and oriented. Pt denies experiencing any anxiety/depression at this time. Pt denies experiencing any pain at this time. Pt denies experiencing any SI/HI, or AVH at this time.    A: Scheduled medications administered to pt, per MD orders. Support and encouragement provided. Frequent verbal contact made.  Routine safety checks conducted q15 minutes.   R: No adverse drug reactions noted. Pt verbally contracts for safety at this time. Pt complaint with medications. Pt interacts approprietly with others on the unit. Pt remains safe at this time.

## 2021-12-05 NOTE — [facility] Counselor (Signed)
Clinical Social Work Note  CSW contacted patient's mother S. Pointer [phone] and confirmed that the gun in patient's home has been secured within a gun safe.  Mother is keeping the patient's son and stated that she has informed the patient she will need to come stay with mother a few days so that mother can keep an eye on her and know that she is doing alright.  Mother stated she believes the patient is ready to discharge home.  Ambrose Mantle, LCSW 12/05/2021, 5:01 PM

## 2021-12-05 NOTE — [facility] Group Notes (Signed)
Psychoeducational Group Note    Date:  12/05/2021 Time: 1300-1400    Purpose of Group: . The group focus' on teaching patients on how to identify their needs and their Life Skills:  A group where two lists are made. What people need and what are things that we do that are unhealthy. The lists are developed by the patients and it is explained that we often do the actions that are not healthy to get our list of needs met.  Goal:: to develop the coping skills needed to get their needs met  Participation Level:  Active  Participation Quality:  Appropriate  Affect:  Appropriate  Cognitive:  Oriented  Insight: Improving  Engagement in Group:  Engaged  Modes of Intervention:  Activity, Discussion, Education, and Support  Additional Comments:  Rates her energy at a 9/10. Participated fully in the group.  Paulino Rily

## 2021-12-05 NOTE — Plan of Care (Signed)
Patient cooperative and med compliant. Patient observed out in milieu socializing more often. Patient denies pain. Patient denies SI,HI, and A/V/H with no plan/intent. Patient remains safe on unit and verbalizes no questions/concerns.   Problem: Health Behavior/Discharge Planning: Goal: Compliance with therapeutic regimen will improve Outcome: Progressing   Problem: Role Relationship: Goal: Will demonstrate positive changes in social behaviors and relationships Outcome: Progressing   Problem: Safety: Goal: Ability to disclose and discuss suicidal ideas will improve Outcome: Progressing

## 2021-12-05 NOTE — Progress Notes (Signed)
[facility] MD Progress Note  12/05/2021 11:41 AM Casey Fuentes  MRN:  [id number]  Reason for Admission:  Casey Fuentes is a 26 yo Caucasian female with a reported history of postpartum depression, who presented to the [facility] ED on 11/28/21 with complaints of worsening suicidal thoughts with plans to either cut her wrists or overdose on pills. Pt was transferred to the [facility] behavioral health urgent care for continuous assessment, and subsequently transferred voluntarily to this [facility] for treatment and stabilization of her mood.The patient is currently on Hospital Day 6.   Chart Review from last 24 hours:  The patient's chart was reviewed and nursing notes were reviewed. The patient's case was discussed in multidisciplinary team meeting. Per nursing she attended groups and had no acute behavioral issues or safety concerns noted. She has been more social in the milieu per staff. Per MAR she was compliant with meds except refusal of nicotine patch and required no PRNs.  Information Obtained Today During Patient Interview: The patient was seen and evaluated on the unit. She reports feeling bored and "a little spacey" yesterday but states this has since resolved. She states she feels like her normal self today and believes the medications are helping her not feel "as extreme with my thoughts and feelings." She plans to stay with her mom after discharge and is unsure about the status of her relationship with her boyfriend. She is thinking about looking for a different job so she is not on night shift after she gets home. She states she has talked to her boyfriend and mother and feels they are being supportive. She denies SI, HI, AVH, paranoia, delusions, manic or hypomanic symptoms. She denies feeling depressed or anxious today and we discussed potential discharge tomorrow. She has been drinking more fluids and denies any dysuria today. She voices no physical complaints or medication  side-effects and reports good sleep and appetite. She denies urges for SIB and denies cravings or withdrawal from substances.   Sleep:  good total time unrecorded  Appetite: good  Principal Problem: Bipolar disorder with current episode depressed (HCC) Diagnosis: Principal Problem:   Bipolar disorder with current episode depressed (HCC) Active Problems:   Anxiety state   Cocaine use   Cannabis abuse  Past Psychiatric History: See H&P  Past Medical History:  Past Medical History:  Diagnosis Date   Anxiety 10/22/2017   Chlamydia infection during pregnancy 02/03/2017   Deliberate self-cutting 10/22/2017   Age [age]   Dyspnea    Morbid obesity (HCC) 06/01/2017   Postpartum depression 10/22/2017    Past Surgical History:  Procedure Laterality Date   CESAREAN SECTION N/A 08/02/2017   Procedure: CESAREAN SECTION;  Surgeon: Hildred Laser, MD;  Location: [facility] ORS;  Service: Obstetrics;  Laterality: N/A;   TONSILLECTOMY     Family History:  Family History  Problem Relation Age of Onset   Diabetes Mother    Thyroid disease Mother    Cirrhosis Mother    Healthy Father    Family Psychiatric  History: See H&P  Social History: See H&P   Current Medications: Current Facility-Administered Medications  Medication Dose Route Frequency Provider Last Rate Last Admin   acetaminophen (TYLENOL) tablet 650 mg  650 mg Oral Q6H PRN Oneta Rack, NP       alum & mag hydroxide-simeth (MAALOX/MYLANTA) 200-200-20 MG/5ML suspension 30 mL  30 mL Oral Q4H PRN Oneta Rack, NP       ARIPiprazole (ABILIFY) tablet 10 mg  10 mg Oral Daily Christia Reading, MD   10 mg at 12/05/21 2542   escitalopram (LEXAPRO) tablet 10 mg  10 mg Oral QHS Christia Reading, MD   10 mg at 12/04/21 2048   magnesium hydroxide (MILK OF MAGNESIA) suspension 30 mL  30 mL Oral Daily PRN Oneta Rack, NP       nicotine (NICODERM CQ - dosed in mg/24 hours) patch 21 mg  21 mg Transdermal Daily Attiah, Nadir, MD   21 mg at  12/05/21 0823   traZODone (DESYREL) tablet 100 mg  100 mg Oral QHS PRN Christia Reading, MD        Lab Results:  No results found for this or any previous visit (from the past 48 hour(s)).    Blood Alcohol level:  Lab Results  Component Value Date   ETH <10 11/28/2021    Metabolic Disorder Labs: Lab Results  Component Value Date   HGBA1C 4.3 (L) 12/01/2021   MPG 76.71 12/01/2021   No results found for: "PROLACTIN" Lab Results  Component Value Date   CHOL 136 12/01/2021   TRIG 35 12/01/2021   HDL 46 12/01/2021   CHOLHDL 3.0 12/01/2021   VLDL 7 12/01/2021   LDLCALC 83 12/01/2021     Musculoskeletal: Strength & Muscle Tone: within normal limits Gait & Station: normal Patient leans: N/A  Psychiatric Specialty Exam:  General Appearance: appears stated age, casually dressed and groomed  Behavior: pleasant and cooperative  Psychomotor Activity: no psychomotor agitation or retardation noted; no cogwheeling, no stiffness, no tremor, AIMS 0  Eye Contact: good  Speech: normal amount, tone, volume and fluency  Mood: euthymic  Affect: less constricted, can smile, calm, polite  Thought Process: linear, goal directed  Descriptions of Associations: intact  Thought Content: future oriented, denies AVH, paranoia, delusions, SI or HI  Hallucinations: denies AH, VH , does not appear responding to stimuli  Delusions: denied  Suicidal Thoughts: Denied  Homicidal Thoughts: Denied  Alertness/Orientation: alert and fully oriented  Insight: fair Judgment: fair  Memory: intact  Executive Functions  Concentration: good  Attention Span: good Recall: intact Fund of Knowledge: fair  Assets  Assets:Communication Skills  Physical Exam Constitutional:      Appearance: Normal appearance.  Cardiovascular:     Rate and Rhythm: Normal rate.  Pulmonary:     Effort: Pulmonary effort is normal.  Neurological:     General: No focal deficit present.     Mental Status:  Alert and oriented to person, place, and time.   Review of Systems  Respiratory:  Negative for shortness of breath.   Cardiovascular:  Negative for chest pain.  Gastrointestinal:  Negative for constipation, diarrhea, nausea and vomiting.  Genitourinary:  Negative for dysuria.  Neurological:  Negative for dizziness and headaches.   Blood pressure 96/61, pulse 93, temperature 98.3 F (36.8 C), temperature source Oral, resp. rate 18, height 5\' 1"  (1.549 m), weight 90.7 kg, SpO2 93 %. Body mass index is 37.79 kg/m.   ASSESSMENT: Bipolar d/o MRE depressed (questionable psychotic features of paranoia prior to admission) Cannabis use d/o Cocaine use - episodic (r/o stimulant use d/o)  PLAN: Safety and Monitoring:  -- Voluntary/Involuntary admission to inpatient psychiatric unit for safety, stabilization and treatment  -- Daily contact with patient to assess and evaluate symptoms and progress in treatment  -- Patient's case to be discussed in multi-disciplinary team meeting  -- Observation Level : q15 minute checks  -- Vital signs:  q12 hours  -- Precautions:  suicide, elopement, and assault  2.Psychiatric Diagnoses/treatment:  #Bipolar d/o MRE depressed (questionable psychotic features of paranoia prior to admission)  Continue Abilify 10 mg as mood stabilizer- patient resists further dose increase at this time - can be titrated up outpatient if needed  -- Metabolic profile and EKG monitoring obtained while on an atypical antipsychotic (BMI: 37.79 Lipid Panel: WNL HbgA1c: 4.3 QTc:399 on 8/13)   Continue Lexapro to 10 mg QHS - close monitoring for mood escalation with start of antidepressant   -PRN: trazodone 100 mg QHS to QHS PRN for insomnia   Cannabis use d/o Cocaine use - episodic (r/o stimulant use d/o) -- Counseled on need to abstain from illicit substances after discharge -- would benefit from outpatient SA counseling after discharge and agrees to this with [facility]    3. Medical Issues:  Pertinent labs: CMP WNL, Lipid panel WNL, Vit D WNL, B12 WNL, CBC: mildly elevated RBC,Hgb, and HCT, A1c 4.3, UDS positive THC, EKG: Qtc 399 on 8/13; vit B1 123.6, UA WNL except rare bacteria  Tobacco Use Disorder  -- Nicotine patch 21mg /24 hours ordered  -- Smoking cessation encouraged   Mildly elevated H/H   -- questionably due to hemoconcentration or nicotine use - will need f/u with PCP after discharge  Total Time Spent in Direct Patient Care:  I personally spent 25 minutes on the unit in direct patient care. The direct patient care time included face-to-face time with the patient, reviewing the patient's chart, communicating with other professionals, and coordinating care. Greater than 50% of this time was spent in counseling or coordinating care with the patient regarding goals of hospitalization, psycho-education, and discharge planning needs.  10-06-1983, MD, FAPA 12/05/2021, 11:41 AM

## 2021-12-05 NOTE — BHH Group Notes (Signed)
Goals Group 12/05/2021   Group Focus: affirmation, clarity of thought, and goals/reality orientation Treatment Modality:  Psychoeducation Interventions utilized were assignment, group exercise, and support Purpose: To be able to understand and verbalize the reason for their admission to the hospital. To understand that the medication helps with their chemical imbalance but they also need to work on their choices in life. To be challenged to develop a list of 30 positives about themselves. Also introduce the concept that "feelings" are not reality.  Participation Level:  Active  Participation Quality:  Appropriate  Affect:  Appropriate  Cognitive:  Appropriate  Insight:  Improving  Engagement in Group:  Engaged  Additional Comments: Rates her energy at a 9/10.Participated fully in the group.  Dione Housekeeper

## 2021-12-06 DIAGNOSIS — F3130 Bipolar disorder, current episode depressed, mild or moderate severity, unspecified: Principal | ICD-10-CM

## 2021-12-06 MED ORDER — ESCITALOPRAM OXALATE 10 MG PO TABS: 10.0000 mg | ORAL_TABLET | Freq: Every day | ORAL | 0 refills | Status: DC

## 2021-12-06 MED ORDER — ARIPIPRAZOLE 10 MG PO TABS: 10.0000 mg | ORAL_TABLET | Freq: Every day | ORAL | 0 refills | Status: DC

## 2021-12-06 MED ORDER — NICOTINE 21 MG/24HR TD PT24: 21.0000 mg | MEDICATED_PATCH | Freq: Every day | TRANSDERMAL | 0 refills | Status: DC | PRN

## 2021-12-06 NOTE — BHH Group Notes (Signed)
Adult Psychoeducational Group Note Date:  12/06/2021 Time:  0900-1045 Group Topic/Focus: PROGRESSIVE RELAXATION. A group where deep breathing is taught and tensing and relaxation muscle groups is used. Imagery is used as well.  Pts are asked to imagine 3 pillars that hold them up when they are not able to hold themselves up and to share that with the group.  Participation Level:  did not attend  Rami Waddle A   

## 2021-12-06 NOTE — Progress Notes (Signed)
  [facility] Adult Case Management Discharge Plan :  Will you be returning to the same living situation after discharge:  No.  Will stay with mother a few days At discharge, do you have transportation home?: Yes,  mother to pick up at 1:00pm Do you have the ability to pay for your medications: Yes,  insurance and income  Release of information consent forms completed and emailed to Medical Records, then turned in to Medical Records by CSW.   Patient to Follow up at:  Follow-up Information     Services, [facility]. Go on 12/09/2021.   Why: You have a hospital follow up appointment to obtain therapy and medication management services on Wednesday 12/09/21 at 10:00 am.   This appointment will be held in person. Contact information: [address] [place] [address] [phone]                 Next level of care provider has access to [facility] Link:no  Safety Planning and Suicide Prevention discussed: Yes,  with mother     Has patient been referred to the Quitline?: Patient refused referral  Patient has been referred for addiction treatment: Yes  Lynnell Chad, LCSW 12/06/2021, 9:34 AM

## 2021-12-06 NOTE — Discharge Summary (Signed)
Physician Discharge Summary Note  Patient:  Casey Fuentes is an 26 y.o., female MRN:  [id number] DOB:  10/20/1995 Patient phone:  [phone] (home)  Patient address:   [address] [place] [address],   Total Time Spent in Direct Patient Care:  I personally spent 35 minutes on the unit in direct patient care. The direct patient care time included face-to-face time with the patient, reviewing the patient's chart, communicating with other professionals, and coordinating care. Greater than 50% of this time was spent in counseling or coordinating care with the patient regarding goals of hospitalization, psycho-education, and discharge planning needs.  Date of Admission:  11/29/2021 Date of Discharge: 12/06/2021  Reason for Admission:  Casey Fuentes is a 26 yo Caucasian female with a reported history of postpartum depression, who presented to the [facility] ED on 11/28/21 with complaints of worsening suicidal thoughts with plans to either cut her wrists or overdose on pills. Pt was transferred to the [facility] behavioral health urgent care for continuous assessment, and subsequently transferred voluntarily to this [facility] for treatment and stabilization of her mood. See H&P for details.   Principal Problem: Bipolar disorder with current episode depressed Casey Fuentes) Discharge Diagnoses: Principal Problem:   Bipolar disorder with current episode depressed (HCC) Active Problems:   Anxiety state   Cocaine use   Cannabis abuse  Past Psychiatric History: see H&P  Past Medical History:  Past Medical History:  Diagnosis Date   Anxiety 10/22/2017   Chlamydia infection during pregnancy 02/03/2017   Deliberate self-cutting 10/22/2017   Age [age]   Dyspnea    Morbid obesity (HCC) 06/01/2017   Postpartum depression 10/22/2017    Past Surgical History:  Procedure Laterality Date   CESAREAN SECTION N/A 08/02/2017   Procedure: CESAREAN SECTION;  Surgeon: Hildred Laser,  MD;  Location: [facility] ORS;  Service: Obstetrics;  Laterality: N/A;   TONSILLECTOMY     Family History:  Family History  Problem Relation Age of Onset   Diabetes Mother    Thyroid disease Mother    Cirrhosis Mother    Healthy Father    Family Psychiatric  History: see H&P  Social History:  Social History   Substance and Sexual Activity  Alcohol Use Yes   Alcohol/week: 4.0 standard drinks of alcohol   Types: 4 Shots of liquor per week   Comment: reports dinking 4 standard drinks 1x weekly     Social History   Substance and Sexual Activity  Drug Use Yes   Types: Marijuana, Cocaine   Comment: cocaine is occasional uds did not show    [facility] Course:  The patient was admitted voluntarily to [facility] where she was seen daily by attending psychiatrist/APP and her care was discussed in daily, multi-disciplinary team meeting. She was monitored on q15 minute safety checks.   On admission she was felt to meet criteria or Bipolar d/o MRE depressed and she reported some vague sx of paranoia prior to admission. It was unclear if report of paranoia was related to [facility] and cocaine use prior to admission. She was counseled on the need for abstinence from substance use after admission and did agree to outpatient SA therapy after discharge.   She was started on Abilify titrating up to 10mg  daily for mood stabilization, concern for recent paranoia, and to augment for depression. Her admission Zoloft was held while she was started on a mood stabilizer and patient did not feel the Zoloft had been helping and declined to  restart this during admission. She did agree to start of Lexapro 5mg  daily for help with residual depressive sx and SI, and this was increased to 10mg  and moved to bedtime due to report of sedation on the medication. She tolerated the 2 medications well and had gradual improvement in her mood over time in the [facility]. She reported resolution of SI and had no acute psychotic sx during admission.    Gradually patient was noted to be more social with peers, attending groups, and interactive in the milieu. Safety planning was completed with her mother. She agreed to outpatient f/u with [facility] after discharge. During admission she was noted to have mildly elevated H/H which was felt to likely be due to nicotine abuse or hemoconcentration. She was encouraged to drink fluids and have this recheck by a PCP after discharge.  On day of discharge, she denied SI, HI, AVH, paranoia, ideas of reference, first rank symptoms, or delusions. She reported stable and improved mood. She appeared forward thinking and could articulate a safety and discharge plan. We discussed that collateral from her mother obtained by social work yesterday confirmed that safety planning has been done and mother agreed the patient was improved and ready for discharge. Patient plans to stay with mother after discharge. She was advised to abstain from alcohol and illicit drug use and to keep appointments for therapy, SA counseling, and medication management with [facility] after discharge without fail. She was advised she will need monitoring for TD/EPS and metabolic lab monitoring while on Abilify and will need monitoring for SI or signs of mania while on Lexapro. Her H/H was mildly elevated on admission and this was felt to likely be hemoconcentration or due to nicotine abuse. She was advised to fluid hydrate and have this rechecked by PCP without fail after discharge. She voiced no physical complaints and reported stable sleep and appetite. She denied medication side-effects.  Physical Findings: AIMS: 0  Musculoskeletal: Strength & Muscle Tone: within normal limits Gait & Station: normal Patient leans: N/A   Psychiatric Specialty Exam   Presentation  General Appearance: casually dressed, good hygiene, appears stated age   Eye Contact:Good   Speech:Clear and Coherent   Speech Volume:Normal   Mood and Affect   Mood:described as improved - appears more euthymic   Affect:calm, polite, mildly constricted but can smile at times   Thought Process  Thought Processes:Linear, goal directed   Descriptions of Associations:Intact   Orientation:Full (Time, Place and Person)   Thought Content:Denies SI, HI, AVH,paranoia or delusions, ideas of reference or first rank symptoms, no SI or HI   Hallucinations:Denied   Suicidal Thoughts:Denied   Homicidal Thoughts:Denied   Sensorium  Memory:Immediate Good   Judgment:Fair   Insight:Fair     Executive Functions  Concentration:Good   Attention Span:Good   Recall:Intact   Fund of Knowledge:Good   Language:Good     Psychomotor Activity  Psychomotor Activity:Normal AIMS 0, no tremor, no stiffness, no cogwheeling   Assets  Assets:Communication Skills   Physical Exam: Physical Exam Vitals and nursing note reviewed.  Constitutional:      Appearance: Normal appearance.  HENT:     Head: Normocephalic.  Pulmonary:     Effort: Pulmonary effort is normal.  Neurological:     General: No focal deficit present.     Mental Status: She is alert.      Review of Systems  Respiratory:  Negative for shortness of breath.   Cardiovascular:  Negative for chest pain and palpitations.  Gastrointestinal:  Negative for constipation, diarrhea, nausea and vomiting.  Neurological:  Negative for dizziness and headaches.    Blood pressure 96/71, pulse 94, temperature 98.3 F (36.8 C), temperature source Oral, resp. rate 20, height 5\' 1"  (1.549 m), weight 90.7 kg, SpO2 98 %. Body mass index is 37.79 kg/m.   Social History   Tobacco Use  Smoking Status Never  Smokeless Tobacco Current  Tobacco Comments   Vaping nicotine   Tobacco Cessation:  A prescription for an FDA-approved tobacco cessation medication provided at discharge   Blood Alcohol level:  Lab Results  Component Value Date   [facility] <10 11/28/2021    Metabolic Disorder Labs:  Lab  Results  Component Value Date   HGBA1C 4.3 (L) 12/01/2021   MPG 76.71 12/01/2021   No results found for: "PROLACTIN" Lab Results  Component Value Date   CHOL 136 12/01/2021   TRIG 35 12/01/2021   HDL 46 12/01/2021   CHOLHDL 3.0 12/01/2021   VLDL 7 12/01/2021   LDLCALC 83 12/01/2021    See Psychiatric Specialty Exam and Suicide Risk Assessment completed by Attending Physician prior to discharge.  Discharge destination:  Home  Is patient on multiple antipsychotic therapies at discharge:  No   Has Patient had three or more failed trials of antipsychotic monotherapy by history:  No  Recommended Plan for Multiple Antipsychotic Therapies: NA   Allergies as of 12/06/2021       Reactions   Amoxicillin         Medication List     TAKE these medications      Indication  ARIPiprazole 10 MG tablet Commonly known as: ABILIFY Take 1 tablet (10 mg total) by mouth daily.  Indication: Depressive phase of bipolar 1   escitalopram 10 MG tablet Commonly known as: LEXAPRO Take 1 tablet (10 mg total) by mouth at bedtime.  Indication: bipolar depression   nicotine 21 mg/24hr patch Commonly known as: NICODERM CQ - dosed in mg/24 hours Place 1 patch (21 mg total) onto the skin daily as needed (smoking cessation).  Indication: Nicotine Addiction        Follow-up Information     Services, [facility]. Go on 12/09/2021.   Why: You have a [facility] follow up appointment to obtain therapy and medication management services on Wednesday 12/09/21 at 10:00 am.   This appointment will be held in person. Contact information: [address] [place] [phone]                 Follow-up recommendations:  Activity:  as tolerated Diet:  heart healthy Other:  Patient advised to abstain from alcohol and illicit drug use and to start substance abuse counseling and therapy with [facility] after discharge. She was encouraged to comply with medications and  outpatient mental health medication management appointments. She was advised she will need ongoing monitoring of her weight, EKG, CBC, AIMS, lipids and glucose while on an atypical antipsychotic. She was advised to watch for suicidal thinking and signs of mania while on an antidepressant. She was advised to fluid hydrate and have her primary care provider recheck her mildly elevated hemoglobin and hematocrit after discharge. Smoking cessation was encouraged.    Signed: [facility], MD, FAPA 12/06/2021, 7:25 AM

## 2021-12-06 NOTE — Group Note (Signed)
LCSW Group Therapy Note  12/06/2021      Type of Therapy and Topic:  Group Therapy: Gratitude  Participation Level:  Did Not Attend   Description of Group:   In this group, patients shared and discussed the importance of acknowledging the elements in their lives for which they are grateful and how this can positively impact their mood.  The group discussed how bringing the positive elements of their lives to the forefront of their minds can help with recovery from any illness, physical or mental.  An exercise was done as a group in which a list was made of gratitude items in order to encourage participants to consider other potential positives in their lives.  Therapeutic Goals: Patients will identify one or more item for which they are grateful in each of 6 categories:  people, experience, thing, place, skill, and other. Patients will discuss how it is possible to seek out gratitude in even bad situations. Patients will explore other possible items of gratitude that they could remember.   Summary of Patient Progress:  .Was diccharged prior to the start of group.   Therapeutic Modalities:   Solution-Focused Therapy Activity  Carloyn Jaeger Grossman-Orr, LCSW .

## 2021-12-06 NOTE — BHH Group Notes (Signed)
Pt attended goals group, her goal is to go home and be positive.
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
## 2021-12-06 NOTE — Progress Notes (Signed)
Patient is discharging at this time. Patient is A&Ox4. Vs stable. Patient denies SI,HI, and A/V/H with no plan/intent. Printed AVS reviewed with and given to patient along with medications and follow up appointments. Patient verbalized all understanding. All valuables/belongings returned to patient. Patient is being transported by her mother. Patient denies any pain/discomfort. No s/s of current distress.  

## 2022-01-01 NOTE — BHH Counselor (Signed)
CSW received FMLA paperwork from the Pt to be completed by the providers.  Once FMLA paperwork was completed CSW attempted to contact the Pt at the number provided in the chart [phone]).  There was no answer at this number and CSW left a HIPAA approved voicemail informing the Pt that her FLMA paperwork was completed by the provider but would needed to be picked up from the hospital because the Pt has not completed her portion of the paperwork which includes her work times and job hours.  CSW then contacted her mother Shterna Laramee ([phone]) who was listed as the Pt's SPE contact in her chart from the prior hospitalization.  CSW explained the situation to her amd Mrs. Stetzer then stated that her daughter has no car and can't come to the hospital to pick up the paperwork.  Mrs. Dan Humphreys stated that she sees her daughter daily and aaked that the paperwork be faxed to her.  CSW faxed this paperwork to Mrs. Nelda Severe on 01/01/2022 at 11:50am.  CSW also sent a copy of the paperwork to Medical Records to be stored in the Pt's medical chart.

## 2022-04-01 ENCOUNTER — Inpatient Hospital Stay: Admission: RE | Admit: 2022-04-01 | Payer: Managed Care, Other (non HMO) | Source: Ambulatory Visit

## 2022-04-10 IMAGING — CT CT ABD-PELV W/ CM
2 of 4 series · 16 of 46 positions shown, 18 images · IV contrast (APPLIED)
Comparison: None.

CLINICAL DATA: Abdominal pain.

EXAM:
CT ABDOMEN AND PELVIS WITH CONTRAST
TECHNIQUE: Multidetector CT imaging of the abdomen and pelvis was performed
using the standard protocol following bolus administration of
intravenous contrast.
CONTRAST:  100mL OMNIPAQUE IOHEXOL 300 MG/ML  SOLN

[Series 2: routine abd/pel with · axial · 0.98mm/px · z∈[-480,-65]mm · 13 of 93 slices shown, 15 images]
[im 5/93  soft-tissue]
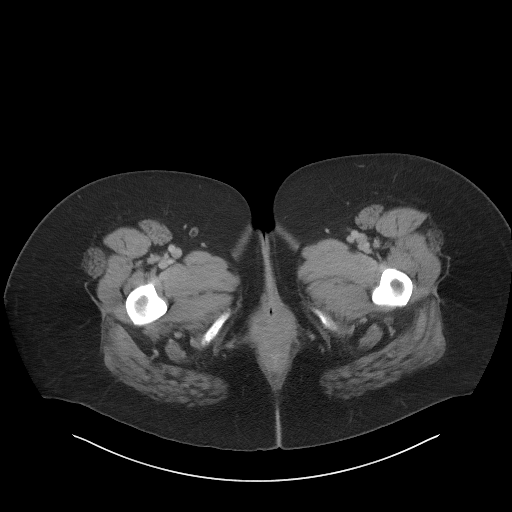
[im 5/93  bone]
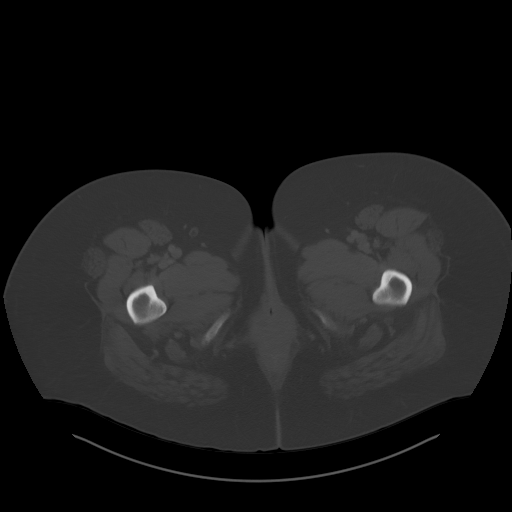
[im 13/93  soft-tissue]
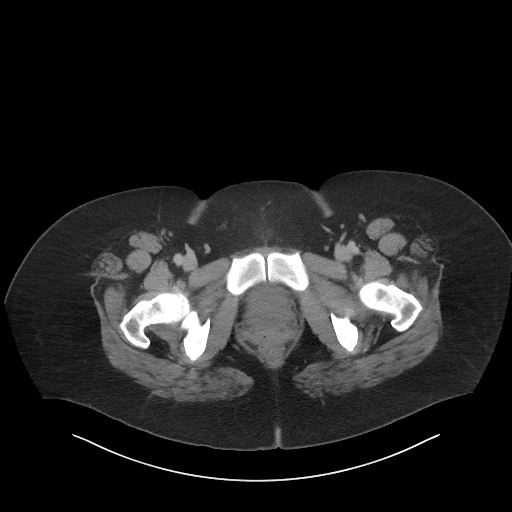
[im 21/93  soft-tissue]
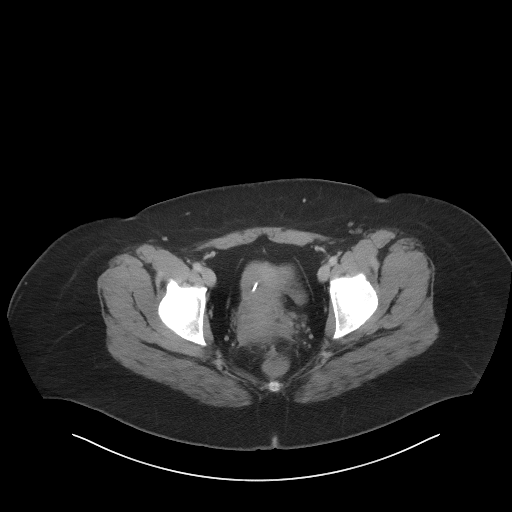
[im 26/93  soft-tissue]
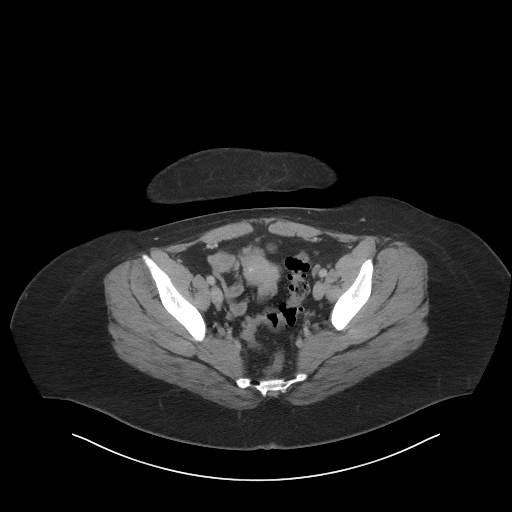
[im 34/93  soft-tissue]
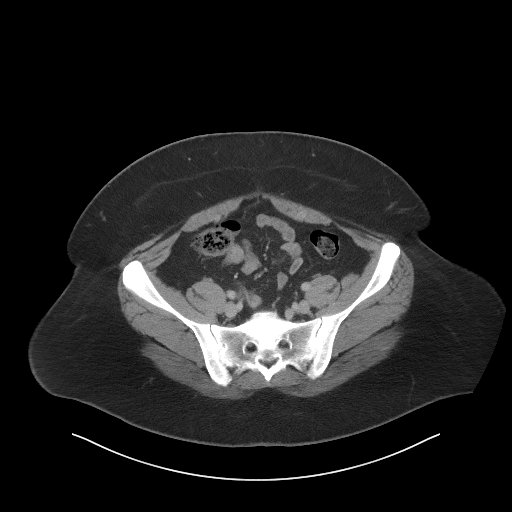
[im 38/93  soft-tissue]
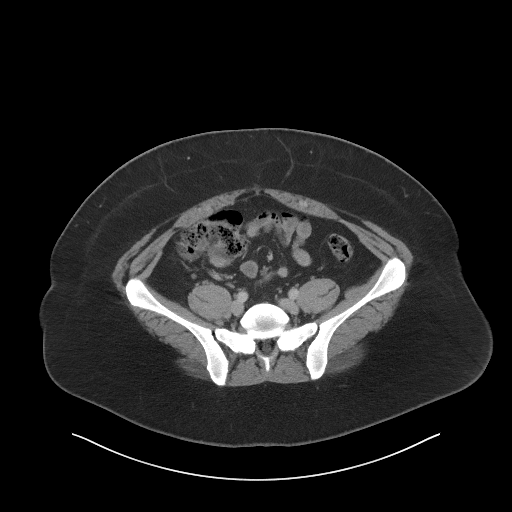
[im 47/93  soft-tissue]
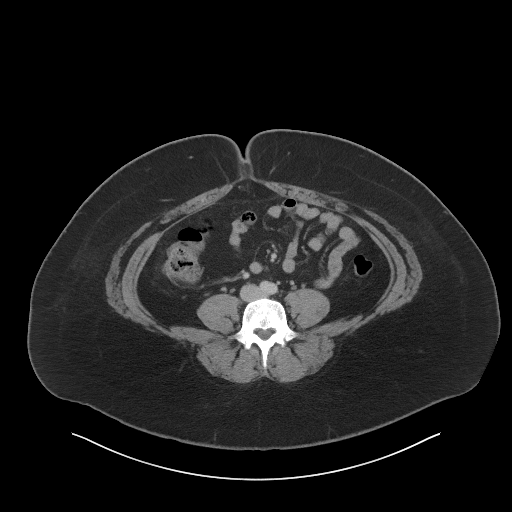
[im 55/93  soft-tissue]
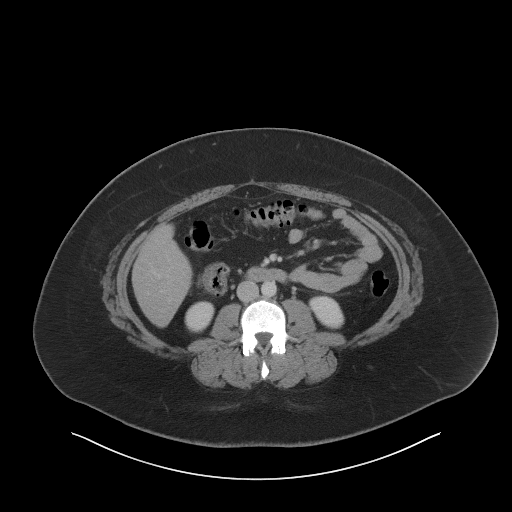
[im 59/93  soft-tissue]
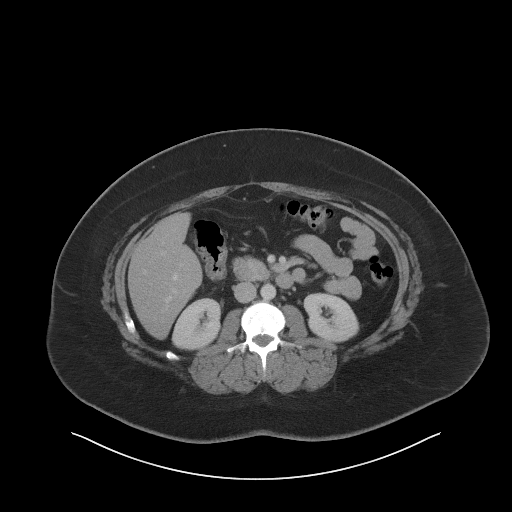
[im 59/93  bone]
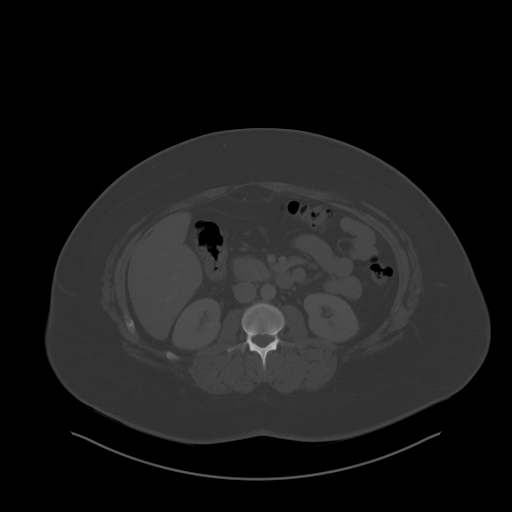
[im 67/93  soft-tissue]
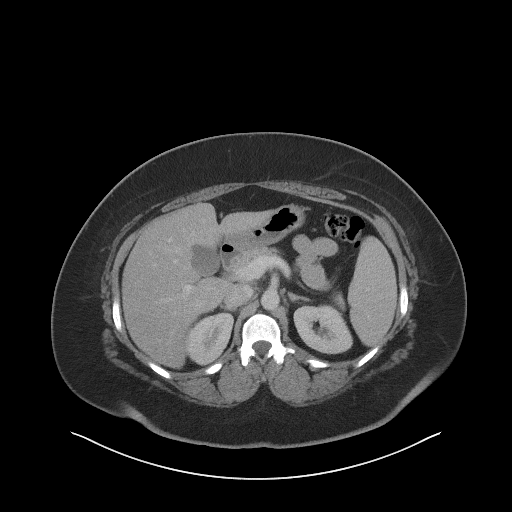
[im 72/93  soft-tissue]
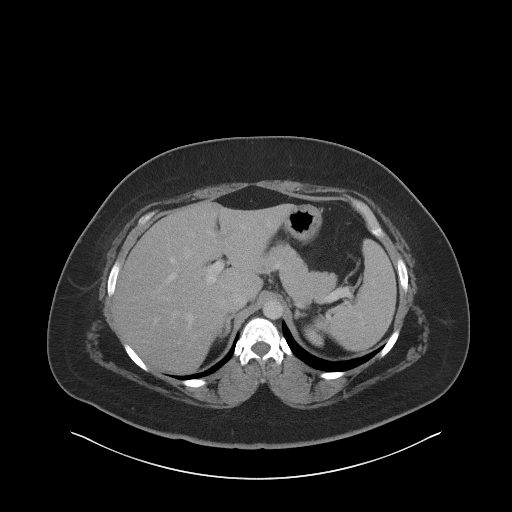
[im 80/93  soft-tissue]
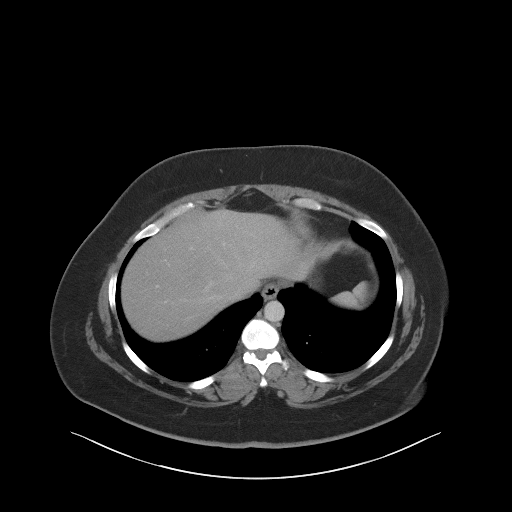
[im 88/93  soft-tissue]
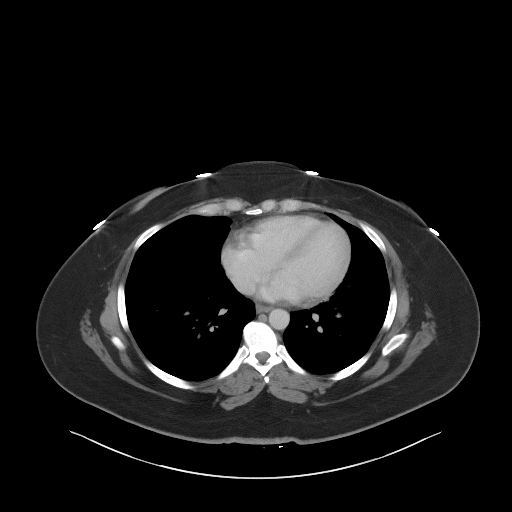

[Series 5: coronal st · coronal · 0.71mm/px · 3 of 98 slices shown]
[im 33/98  soft-tissue]
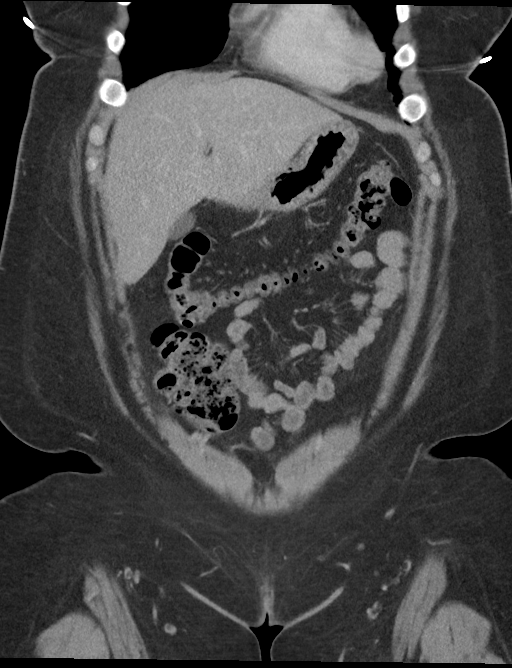
[im 44/98  soft-tissue]
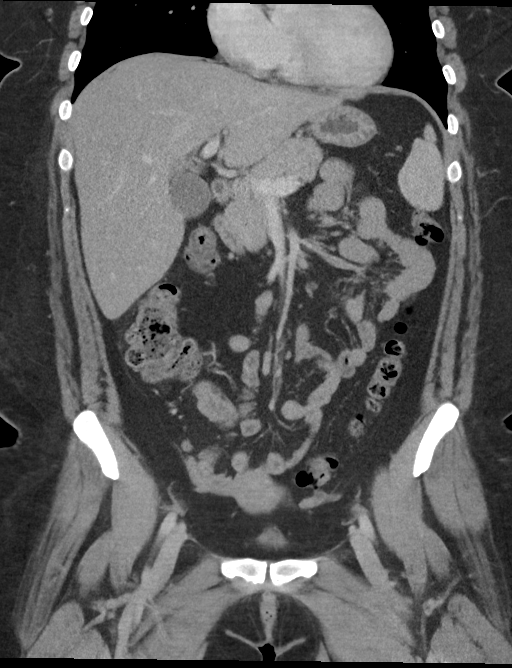
[im 54/98  soft-tissue]
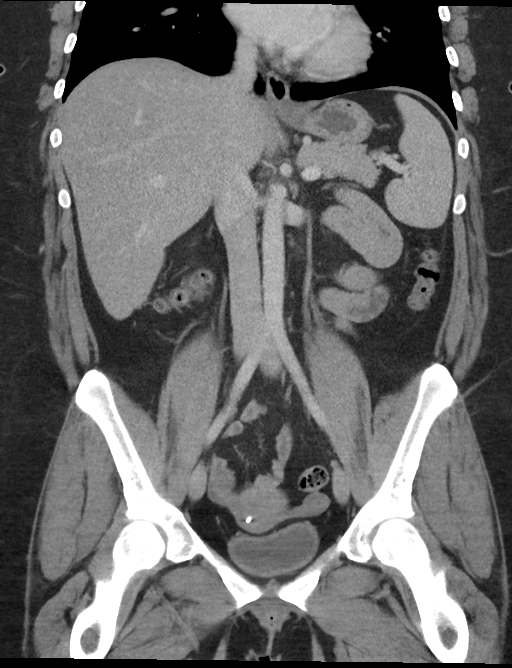

[16 of 46 positions shown; findings below may reference images not displayed]

FINDINGS: Lower chest: The visualized lung bases are clear.

No intra-abdominal free air or free fluid.

Hepatobiliary: Mild fatty liver. No intrahepatic biliary dilatation.
The gallbladder is unremarkable.

Pancreas: Unremarkable. No pancreatic ductal dilatation or
surrounding inflammatory changes.

Spleen: Normal in size without focal abnormality.

Adrenals/Urinary Tract: Adrenal glands are unremarkable. Kidneys are
normal, without renal calculi, focal lesion, or hydronephrosis.
Bladder is unremarkable.

Stomach/Bowel: There is no bowel obstruction or active inflammation.
The appendix is normal.

Vascular/Lymphatic: The abdominal aorta and IVC unremarkable. No
portal venous gas. There is no adenopathy.

Reproductive: The uterus is anteverted. An intrauterine device is
noted. No adnexal masses.

Other: None

Musculoskeletal: No acute or significant osseous findings.
IMPRESSION: 1. No acute intra-abdominal or pelvic pathology.
2. Mild fatty liver.

## 2023-09-24 ENCOUNTER — Other Ambulatory Visit: Payer: Self-pay

## 2023-09-24 ENCOUNTER — Encounter: Payer: Self-pay | Admitting: Emergency Medicine

## 2023-09-24 DIAGNOSIS — R1110 Vomiting, unspecified: Secondary | ICD-10-CM | POA: Diagnosis not present

## 2023-09-24 DIAGNOSIS — R1030 Lower abdominal pain, unspecified: Secondary | ICD-10-CM | POA: Diagnosis present

## 2023-09-24 LAB — URINALYSIS, ROUTINE W REFLEX MICROSCOPIC
Bilirubin Urine: NEGATIVE
Glucose, UA: NEGATIVE mg/dL
Hgb urine dipstick: NEGATIVE
Ketones, ur: NEGATIVE mg/dL
Leukocytes,Ua: NEGATIVE
Nitrite: NEGATIVE
Protein, ur: NEGATIVE mg/dL
Specific Gravity, Urine: 1.013 (ref 1.005–1.030)
pH: 6 (ref 5.0–8.0)

## 2023-09-24 LAB — CBC
HCT: 42.5 % (ref 36.0–46.0)
Hemoglobin: 14.9 g/dL (ref 12.0–15.0)
MCH: 31.4 pg (ref 26.0–34.0)
MCHC: 35.1 g/dL (ref 30.0–36.0)
MCV: 89.5 fL (ref 80.0–100.0)
Platelets: 286 10*3/uL (ref 150–400)
RBC: 4.75 MIL/uL (ref 3.87–5.11)
RDW: 12 % (ref 11.5–15.5)
WBC: 9.6 10*3/uL (ref 4.0–10.5)
nRBC: 0 % (ref 0.0–0.2)

## 2023-09-24 LAB — POC URINE PREG, ED: Preg Test, Ur: NEGATIVE

## 2023-09-24 NOTE — ED Triage Notes (Signed)
 Arrived pov, ambulatory to triage for abd pain  Per pt "today I tried to use the bathroom and it felt like I was constipated, I've been having very sharp pains in my lower abdomen for a couple of months, but the pain is worse tonight."  NADN

## 2023-09-25 ENCOUNTER — Emergency Department
Admission: EM | Admit: 2023-09-25 | Discharge: 2023-09-25 | Disposition: A | Discharge status: Home / Self Care | Attending: Emergency Medicine | Admitting: Emergency Medicine

## 2023-09-25 ENCOUNTER — Emergency Department

## 2023-09-25 DIAGNOSIS — R109 Unspecified abdominal pain: Secondary | ICD-10-CM

## 2023-09-25 LAB — LIPASE, BLOOD: Lipase: 36 U/L (ref 11–51)

## 2023-09-25 LAB — COMPREHENSIVE METABOLIC PANEL WITH GFR
ALT: 21 U/L (ref 0–44)
AST: 19 U/L (ref 15–41)
Albumin: 3.5 g/dL (ref 3.5–5.0)
Alkaline Phosphatase: 56 U/L (ref 38–126)
Anion gap: 10 (ref 5–15)
BUN: 12 mg/dL (ref 6–20)
CO2: 22 mmol/L (ref 22–32)
Calcium: 8.6 mg/dL — ABNORMAL LOW (ref 8.9–10.3)
Chloride: 102 mmol/L (ref 98–111)
Creatinine, Ser: 0.77 mg/dL (ref 0.44–1.00)
GFR, Estimated: >60 mL/min (ref >60)
Glucose, Bld: 109 mg/dL — ABNORMAL HIGH (ref 70–99)
Potassium: 3.6 mmol/L (ref 3.5–5.1)
Sodium: 134 mmol/L — ABNORMAL LOW (ref 135–145)
Total Bilirubin: 0.9 mg/dL (ref 0.0–1.2)
Total Protein: 7.4 g/dL (ref 6.5–8.1)

## 2023-09-25 MED ORDER — IOHEXOL 300 MG/ML  SOLN
100.0000 mL | Freq: Once | INTRAMUSCULAR | Status: AC | PRN
  Administered 2023-09-25: 100 mL via INTRAVENOUS

## 2023-09-25 MED ORDER — KETOROLAC TROMETHAMINE 30 MG/ML IJ SOLN
15.0000 mg | Freq: Once | INTRAMUSCULAR | Status: AC
  Administered 2023-09-25: 15 mg via INTRAVENOUS
  Filled 2023-09-25: qty 1

## 2023-09-25 NOTE — ED Provider Notes (Signed)
 [facility] Provider Note    Event Date/Time   First MD Initiated Contact with Patient 09/25/23 [phone]     (approximate)   History   Abdominal Pain   HPI  Casey Fuentes is a 28 y.o. female history of postpartum depression   2 months having intermittent lower abdominal pain and slight tenderness.  Yesterday increasing discomfort very low abdomen.  Associated with vomiting a couple times "yellow bile"  No ongoing nausea reports mild pain.  Pain was fairly severe yesterday located in the mid to lower abdomen but has subsided greatly without intervention.  Still having some symptoms reports discomfort bowel movement.  Last bowel movement today.  No bloody.  Denies pregnancy.  Has IUD.  No vaginal bleeding or discharge.     Physical Exam   Triage Vital Signs: ED Triage Vitals [09/24/23 2329]  Encounter Vitals Group     BP 121/88     Systolic BP Percentile      Diastolic BP Percentile      Pulse Rate 75     Resp 18     Temp 98.4 F (36.9 C)     Temp Source Oral     SpO2 98 %     Weight 220 lb (99.8 kg)     Height 5\' 1"  (1.549 m)     Head Circumference      Peak Flow      Pain Score      Pain Loc      Pain Education      Exclude from Growth Chart     Most recent vital signs: Vitals:   09/24/23 2329  BP: 121/88  Pulse: 75  Resp: 18  Temp: 98.4 F (36.9 C)  SpO2: 98%     General: Awake, no distress.  CV:  Good peripheral perfusion.  Normal tones and rate Resp:  Normal effort.  Clear Abd:  No distention.  Moderate tenderness only in the suprapubic area.  No focal pain in the lower quadrants or upper abdomen bilateral.  Reports moderate tenderness suprapubically Other:  Denies any pain or burning with urination.  No tenderness to percussion of the costovertebral angles bilateral.   ED Results / Procedures / Treatments   Labs (all labs ordered are listed, but only abnormal results are displayed) Labs Reviewed  COMPREHENSIVE  METABOLIC PANEL WITH GFR - Abnormal; Notable for the following components:      Result Value   Sodium 134 (*)    Glucose, Bld 109 (*)    Calcium 8.6 (*)    All other components within normal limits  URINALYSIS, ROUTINE W REFLEX MICROSCOPIC - Abnormal; Notable for the following components:   Color, Urine YELLOW (*)    APPearance HAZY (*)    All other components within normal limits  LIPASE, BLOOD  CBC  POC URINE PREG, ED     EKG     RADIOLOGY  CT ABDOMEN PELVIS W CONTRAST Result Date: 09/25/2023 CLINICAL DATA:  Abdominal pain, vomiting EXAM: CT ABDOMEN AND PELVIS WITH CONTRAST TECHNIQUE: Multidetector CT imaging of the abdomen and pelvis was performed using the standard protocol following bolus administration of intravenous contrast. RADIATION DOSE REDUCTION: This exam was performed according to the departmental dose-optimization program which includes automated exposure control, adjustment of the mA and/or kV according to patient size and/or use of iterative reconstruction technique. CONTRAST:  OMNIPAQUE  IOHEXOL  300 MG/ML  SOLN COMPARISON:  03/13/2021 FINDINGS: Lower chest: No acute findings Hepatobiliary: No focal  hepatic abnormality. Gallbladder unremarkable. Pancreas: No focal abnormality or ductal dilatation. Spleen: No focal abnormality.  Normal size. Adrenals/Urinary Tract: No adrenal abnormality. No focal renal abnormality. No stones or hydronephrosis. Urinary bladder is unremarkable. Stomach/Bowel: Moderate stool burden in the right colon. Stomach, large and small bowel grossly unremarkable. Appendix not visualized. Vascular/Lymphatic: No evidence of aneurysm or adenopathy. Reproductive: Uterus and adnexa unremarkable. No mass. IUD in the uterus. Other: No free fluid or free air. Musculoskeletal: No acute bony abnormality. IMPRESSION: No acute findings in the abdomen or pelvis. Electronically Signed   By: Janeece Mechanic M.D.   On: 09/25/2023 01:57   US  PELVIC COMPLETE W  TRANSVAGINAL AND TORSION R/O Result Date: 09/25/2023 CLINICAL DATA:  Suprapubic pain.  No menses.  IUD. EXAM: TRANSABDOMINAL AND TRANSVAGINAL ULTRASOUND OF PELVIS DOPPLER ULTRASOUND OF OVARIES TECHNIQUE: Both transabdominal and transvaginal ultrasound examinations of the pelvis were performed. Transabdominal technique was performed for global imaging of the pelvis including uterus, ovaries, adnexal regions, and pelvic cul-de-sac. Color and duplex Doppler ultrasound was utilized to evaluate blood flow to the ovaries. COMPARISON:  CT abdomen pelvis 03/13/2021 FINDINGS: Uterus Measurements: 9.4 x 3.4 x 4.2 cm = volume: 71 mL. No fibroids or other mass visualized. Endometrium Thickness: 6 mm.  IUD in place. Right ovary Measurements: 3.1 x 2.0 x 2.0 cm = volume: 6.4 mL. Normal appearance/no adnexal mass. Left ovary Measurements: 3.6 x 1.5 x 2.9 cm = volume: 8.1 mL. Normal appearance/no adnexal mass. Pulsed Doppler evaluation of both ovaries demonstrates normal low-resistance arterial and venous waveforms. Other findings No abnormal free fluid.  Question bladder wall thickening. IMPRESSION: 1. Question bladder wall thickening. Correlate with urinalysis for cystitis. Otherwise unremarkable pelvic ultrasound. 2. IUD in place. Electronically Signed   By: Rozell Cornet M.D.   On: 09/25/2023 01:43      PROCEDURES:  Critical Care performed: No  Procedures   MEDICATIONS ORDERED IN ED: Medications  ketorolac (TORADOL) 30 MG/ML injection 15 mg (15 mg Intravenous Given 09/25/23 0128)  iohexol  (OMNIPAQUE ) 300 MG/ML solution 100 mL (100 mLs Intravenous Contrast Given 09/25/23 0142)     IMPRESSION / MDM / ASSESSMENT AND PLAN / ED COURSE  I reviewed the triage vital signs and the nursing notes.                              Negative pregnancy test.  Differential diagnosis includes but is not limited to, abdominal perforation, aortic dissection, cholecystitis, appendicitis, diverticulitis, colitis,  esophagitis/gastritis, kidney stone, pyelonephritis, urinary tract infection, aortic aneurysm. All are considered in decision and treatment plan. Based upon the patient's presentation and risk factors, and given that she has had a fair amount of chronicity of about 2 months of lower abdominal tenderness now with exacerbation of the pain with an associate episode of vomiting yesterday I think reasonable to obtain imaging.  She also reports no access to primary care at the time.  She has no symptoms that would be consistent with acute gynecologic infection and denies obvious associated gynecologic symptoms along with this.  I do think however evaluating for ovarian cyst, rupture, torsion would be reasonable also obtaining CT imaging.  Discussed risk benefits and alternatives including no CT scan at with the patient but given the degree of ongoing nature of symptoms for 2 months she is agreeable to proceed.  Overall she is well-appearing nontoxic no further vomiting today and currently denies nausea.  Will provide Toradol.  No acute  urinary symptoms     Patient's presentation is most consistent with acute complicated illness / injury requiring diagnostic workup.    ----------------------------------------- 3:02 AM on 09/25/2023 ----------------------------------------- Resting comfortably.  Patient reports pain has subsided, appears in no distress.  Reviewed reassuring workup.  Reports IUD previously placed about 6 years ago, but is agreeable to following up with OB/GYN.  At this point she does not have any findings that would suggest acute IUD causation of her pain, but I think further follow-up with gynecology would be warranted.  Patient agreeable.   Return precautions and treatment recommendations and follow-up discussed with the patient who is agreeable with the plan.         FINAL CLINICAL IMPRESSION(S) / ED DIAGNOSES   Final diagnoses:  Abdominal cramping     Rx / DC Orders   ED  Discharge Orders     None        Note:  This document was prepared using Dragon voice recognition software and may include unintentional dictation errors.   Iver Marker, MD 09/25/23 [phone]

## 2023-10-07 ENCOUNTER — Encounter (HOSPITAL_COMMUNITY): Payer: Self-pay | Admitting: Emergency Medicine

## 2023-10-07 ENCOUNTER — Other Ambulatory Visit: Payer: Self-pay

## 2023-10-07 ENCOUNTER — Emergency Department (HOSPITAL_COMMUNITY)
Admission: EM | Admit: 2023-10-07 | Discharge: 2023-10-09 | Disposition: A | Discharge status: Other Acute Inpatient Hospital | Attending: Emergency Medicine | Admitting: Emergency Medicine

## 2023-10-07 DIAGNOSIS — F1110 Opioid abuse, uncomplicated: Secondary | ICD-10-CM | POA: Diagnosis not present

## 2023-10-07 DIAGNOSIS — F1310 Sedative, hypnotic or anxiolytic abuse, uncomplicated: Secondary | ICD-10-CM | POA: Diagnosis not present

## 2023-10-07 DIAGNOSIS — F3130 Bipolar disorder, current episode depressed, mild or moderate severity, unspecified: Secondary | ICD-10-CM | POA: Diagnosis not present

## 2023-10-07 DIAGNOSIS — F314 Bipolar disorder, current episode depressed, severe, without psychotic features: Secondary | ICD-10-CM | POA: Diagnosis not present

## 2023-10-07 DIAGNOSIS — R45851 Suicidal ideations: Secondary | ICD-10-CM | POA: Insufficient documentation

## 2023-10-07 DIAGNOSIS — F411 Generalized anxiety disorder: Secondary | ICD-10-CM | POA: Diagnosis not present

## 2023-10-07 DIAGNOSIS — F1210 Cannabis abuse, uncomplicated: Secondary | ICD-10-CM | POA: Diagnosis not present

## 2023-10-07 DIAGNOSIS — F17200 Nicotine dependence, unspecified, uncomplicated: Secondary | ICD-10-CM | POA: Insufficient documentation

## 2023-10-07 DIAGNOSIS — R4689 Other symptoms and signs involving appearance and behavior: Secondary | ICD-10-CM | POA: Diagnosis present

## 2023-10-07 LAB — CBC WITH DIFFERENTIAL/PLATELET
Abs Immature Granulocytes: 0.05 10*3/uL (ref 0.00–0.07)
Basophils Absolute: 0.1 10*3/uL (ref 0.0–0.1)
Basophils Relative: 1 %
Eosinophils Absolute: 0.3 10*3/uL (ref 0.0–0.5)
Eosinophils Relative: 3 %
HCT: 48.3 % — ABNORMAL HIGH (ref 36.0–46.0)
Hemoglobin: 16.3 g/dL — ABNORMAL HIGH (ref 12.0–15.0)
Immature Granulocytes: 1 %
Lymphocytes Relative: 30 %
Lymphs Abs: 2.9 10*3/uL (ref 0.7–4.0)
MCH: 30.6 pg (ref 26.0–34.0)
MCHC: 33.7 g/dL (ref 30.0–36.0)
MCV: 90.6 fL (ref 80.0–100.0)
Monocytes Absolute: 0.6 10*3/uL (ref 0.1–1.0)
Monocytes Relative: 6 %
Neutro Abs: 5.8 10*3/uL (ref 1.7–7.7)
Neutrophils Relative %: 59 %
Platelets: 258 10*3/uL (ref 150–400)
RBC: 5.33 MIL/uL — ABNORMAL HIGH (ref 3.87–5.11)
RDW: 13 % (ref 11.5–15.5)
WBC: 9.7 10*3/uL (ref 4.0–10.5)
nRBC: 0 % (ref 0.0–0.2)

## 2023-10-07 LAB — URINALYSIS, ROUTINE W REFLEX MICROSCOPIC
Bilirubin Urine: NEGATIVE
Glucose, UA: NEGATIVE mg/dL
Hgb urine dipstick: NEGATIVE
Ketones, ur: 5 mg/dL — AB
Nitrite: NEGATIVE
Protein, ur: 30 mg/dL — AB
Specific Gravity, Urine: 1.029 (ref 1.005–1.030)
pH: 5 (ref 5.0–8.0)

## 2023-10-07 LAB — RAPID URINE DRUG SCREEN, HOSP PERFORMED
Amphetamines: NOT DETECTED
Barbiturates: NOT DETECTED
Benzodiazepines: POSITIVE — AB
Cocaine: NOT DETECTED
Opiates: NOT DETECTED
Tetrahydrocannabinol: POSITIVE — AB

## 2023-10-07 LAB — COMPREHENSIVE METABOLIC PANEL WITH GFR
ALT: 16 U/L (ref 0–44)
AST: 15 U/L (ref 15–41)
Albumin: 3.3 g/dL — ABNORMAL LOW (ref 3.5–5.0)
Alkaline Phosphatase: 45 U/L (ref 38–126)
Anion gap: 9 (ref 5–15)
BUN: 10 mg/dL (ref 6–20)
CO2: 23 mmol/L (ref 22–32)
Calcium: 8.8 mg/dL — ABNORMAL LOW (ref 8.9–10.3)
Chloride: 106 mmol/L (ref 98–111)
Creatinine, Ser: 0.9 mg/dL (ref 0.44–1.00)
GFR, Estimated: >60 mL/min (ref >60)
Glucose, Bld: 109 mg/dL — ABNORMAL HIGH (ref 70–99)
Potassium: 4.5 mmol/L (ref 3.5–5.1)
Sodium: 138 mmol/L (ref 135–145)
Total Bilirubin: 0.4 mg/dL (ref 0.0–1.2)
Total Protein: 6.7 g/dL (ref 6.5–8.1)

## 2023-10-07 LAB — HCG, SERUM, QUALITATIVE: Preg, Serum: NEGATIVE

## 2023-10-07 LAB — ACETAMINOPHEN LEVEL: Acetaminophen (Tylenol), Serum: <10 ug/mL — ABNORMAL LOW (ref 10–30)

## 2023-10-07 LAB — ETHANOL: Alcohol, Ethyl (B): <15 mg/dL (ref <15)

## 2023-10-07 LAB — SALICYLATE LEVEL: Salicylate Lvl: <7 mg/dL — ABNORMAL LOW (ref 7.0–30.0)

## 2023-10-07 MED ORDER — CARIPRAZINE HCL 1.5 MG PO CAPS
1.5000 mg | ORAL_CAPSULE | Freq: Every day | ORAL | Status: DC
  Administered 2023-10-07 – 2023-10-09 (×3): 1.5 mg via ORAL
  Filled 2023-10-07 (×4): qty 1

## 2023-10-07 NOTE — ED Notes (Signed)
 Case Number: [id number] [employer] delivered the findings and custody and served the patient.  The findings and custody order has been uploaded to the patients chart. Three copies of the IVC document has been made and are on the clipboard in the purple zone.  The case number has been written in the e-ivc notebook.  A copy has also been placed in the red file.

## 2023-10-07 NOTE — ED Notes (Signed)
 PT is resting at this time.

## 2023-10-07 NOTE — ED Notes (Signed)
 [employer] office called back with a verification that PT would be the 1st pickup in the morning.PT is currently resting

## 2023-10-07 NOTE — ED Notes (Signed)
 PT is sitting [place] style on her bed talking to the EDP.

## 2023-10-07 NOTE — ED Notes (Signed)
 [facility] office transport called.

## 2023-10-07 NOTE — ED Notes (Signed)
 PT on the phone with her mother

## 2023-10-07 NOTE — ED Provider Notes (Signed)
 [place] EMERGENCY DEPARTMENT AT [facility] Provider Note  CSN: [id number] Arrival date & time: 10/07/23 1103  Chief Complaint(s) Suicidal  HPI Casey Fuentes is a 28 y.o. female with past medical history as below, significant for obesity, cannabis use, cocaine use, bipolar 1 disorder, self cutting who presents to the ED with complaint of SI  Patient reports that she has plan to kill herself by overdosing on fentanyl .  She does not want to elaborate on the circumstances surrounding her feelings of wanting to harm herself.  She reports she has harmed her self in the past with self cutting.  Denies any use of drugs or alcohol in the past 24 hours.  Denies any hallucinations or visual disturbances.  Reports she used to follow with psych in the office but no longer.  She lives with mother. No medical complaints  Past Medical History Past Medical History:  Diagnosis Date   Anxiety 10/22/2017   Chlamydia infection during pregnancy 02/03/2017   Deliberate self-cutting 10/22/2017   Age 46-18   Dyspnea    Morbid obesity (HCC) 06/01/2017   Postpartum depression 10/22/2017   Patient Active Problem List   Diagnosis Date Noted   Cocaine use 12/01/2021   Cannabis abuse 12/01/2021   Bipolar I disorder, most recent episode depressed (HCC) 11/30/2021   Insomnia 11/30/2021   Postpartum depression 10/22/2017   Anxiety state 10/22/2017   Deliberate self-cutting 10/22/2017   Morbid obesity (HCC) 06/01/2017   Home Medication(s) Prior to Admission medications   Medication Sig Start Date End Date Taking? Authorizing Provider  ARIPiprazole  (ABILIFY ) 10 MG tablet Take 1 tablet (10 mg total) by mouth daily. 12/06/21   Elmer Hackney, MD  escitalopram  (LEXAPRO ) 10 MG tablet Take 1 tablet (10 mg total) by mouth at bedtime. 12/06/21   Elmer Hackney, MD  nicotine  (NICODERM CQ  - DOSED IN MG/24 HOURS) 21 mg/24hr patch Place 1 patch (21 mg total) onto the skin daily as needed (smoking  cessation). 12/06/21   Elmer Hackney, MD                                                                                                                                    Past Surgical History Past Surgical History:  Procedure Laterality Date   CESAREAN SECTION N/A 08/02/2017   Procedure: CESAREAN SECTION;  Surgeon: Teresa Fender, MD;  Location: [facility] ORS;  Service: Obstetrics;  Laterality: N/A;   TONSILLECTOMY     Family History Family History  Problem Relation Age of Onset   Diabetes Mother    Thyroid  disease Mother    Cirrhosis Mother    Healthy Father     Social History Social History   Tobacco Use   Smoking status: Never   Smokeless tobacco: Current   Tobacco comments:    Vaping nicotine   Vaping Use   Vaping status: Every Day   Substances: Nicotine   Substance Use Topics  Alcohol use: Yes    Alcohol/week: 4.0 standard drinks of alcohol    Types: 4 Shots of liquor per week    Comment: reports dinking 4 standard drinks 1x weekly   Drug use: Yes    Types: Marijuana, Cocaine    Comment: cocaine is occasional uds did not show   Allergies Amoxicillin  Review of Systems A thorough review of systems was obtained and all systems are negative except as noted in the HPI and PMH.   Physical Exam Vital Signs  I have reviewed the triage vital signs BP 121/79   Pulse 83   Temp 98.1 F (36.7 C)   Resp 18   Wt 99.8 kg   SpO2 97%   BMI 41.57 kg/m  Physical Exam Vitals and nursing note reviewed.  Constitutional:      General: She is not in acute distress.    Appearance: Normal appearance. She is well-developed. She is not ill-appearing.  HENT:     Head: Normocephalic and atraumatic.     Right Ear: External ear normal.     Left Ear: External ear normal.     Nose: Nose normal.     Mouth/Throat:     Mouth: Mucous membranes are moist.   Eyes:     General: No scleral icterus.       Right eye: No discharge.        Left eye: No discharge.    Cardiovascular:      Rate and Rhythm: Normal rate.  Pulmonary:     Effort: Pulmonary effort is normal. No respiratory distress.     Breath sounds: No stridor.  Abdominal:     General: Abdomen is flat. There is no distension.     Tenderness: There is no guarding.   Musculoskeletal:        General: No deformity.     Cervical back: No rigidity.   Skin:    General: Skin is warm and dry.     Coloration: Skin is not cyanotic, jaundiced or pale.   Neurological:     Mental Status: She is alert and oriented to person, place, and time.     GCS: GCS eye subscore is 4. GCS verbal subscore is 5. GCS motor subscore is 6.   Psychiatric:        Speech: Speech normal.        Behavior: Behavior normal. Behavior is cooperative.     ED Results and Treatments Labs (all labs ordered are listed, but only abnormal results are displayed) Labs Reviewed  CBC WITH DIFFERENTIAL/PLATELET - Abnormal; Notable for the following components:      Result Value   RBC 5.33 (*)    Hemoglobin 16.3 (*)    HCT 48.3 (*)    All other components within normal limits  COMPREHENSIVE METABOLIC PANEL WITH GFR - Abnormal; Notable for the following components:   Glucose, Bld 109 (*)    Calcium 8.8 (*)    Albumin 3.3 (*)    All other components within normal limits  URINALYSIS, ROUTINE W REFLEX MICROSCOPIC - Abnormal; Notable for the following components:   Color, Urine AMBER (*)    APPearance HAZY (*)    Ketones, ur 5 (*)    Protein, ur 30 (*)    Leukocytes,Ua TRACE (*)    Bacteria, UA FEW (*)    All other components within normal limits  RAPID URINE DRUG SCREEN, HOSP PERFORMED - Abnormal; Notable for the following components:   Benzodiazepines POSITIVE (*)  Tetrahydrocannabinol POSITIVE (*)    All other components within normal limits  ACETAMINOPHEN  LEVEL - Abnormal; Notable for the following components:   Acetaminophen  (Tylenol ), Serum <10 (*)    All other components within normal limits  SALICYLATE LEVEL - Abnormal; Notable  for the following components:   Salicylate Lvl <7.0 (*)    All other components within normal limits  HCG, SERUM, QUALITATIVE  ETHANOL                                                                                                                          Radiology No results found.  Pertinent labs & imaging results that were available during my care of the patient were reviewed by me and considered in my medical decision making (see MDM for details).  Medications Ordered in ED Medications - No data to display                                                                                                                                   Procedures Procedures  (including critical care time)  Medical Decision Making / ED Course    Medical Decision Making:    Casey Fuentes is a 28 y.o. female with past medical history as below, significant for obesity, cannabis use, cocaine use, bipolar 1 disorder, self cutting who presents to the ED with complaint of SI. The complaint involves an extensive differential diagnosis and also carries with it a high risk of complications and morbidity.  Serious etiology was considered. Ddx includes but is not limited to: Psychiatric disturbance, substance-induced psychiatric disturbance, malingering, suicidal, etc.  Complete initial physical exam performed, notably the patient was in no distress, sleeping.    Reviewed and confirmed nursing documentation for past medical history, family history, social history.  Vital signs reviewed.     Brief summary:  28 year old female history above here with plan for suicide Plan to overdose on fentanyl  She does not appear to be acutely psychotic at this time        Screening labs obtained from triage revealing for benzos and THC in her urine.  Hemoconcentration, possible UTI although the UA was contaminated.  She is not having any dysuria urgency, frequency.  Acutely suicidal with plan, will place  IVC  She is medically clear at this point.  Pending TTS evaluation         Additional history obtained: -Additional  history obtained from na -External records from outside source obtained and reviewed including: Chart review including previous notes, labs, imaging, consultation notes including  Prior ER evaluation, medications home   Lab Tests: -I ordered, reviewed, and interpreted labs.   The pertinent results include:   Labs Reviewed  CBC WITH DIFFERENTIAL/PLATELET - Abnormal; Notable for the following components:      Result Value   RBC 5.33 (*)    Hemoglobin 16.3 (*)    HCT 48.3 (*)    All other components within normal limits  COMPREHENSIVE METABOLIC PANEL WITH GFR - Abnormal; Notable for the following components:   Glucose, Bld 109 (*)    Calcium 8.8 (*)    Albumin 3.3 (*)    All other components within normal limits  URINALYSIS, ROUTINE W REFLEX MICROSCOPIC - Abnormal; Notable for the following components:   Color, Urine AMBER (*)    APPearance HAZY (*)    Ketones, ur 5 (*)    Protein, ur 30 (*)    Leukocytes,Ua TRACE (*)    Bacteria, UA FEW (*)    All other components within normal limits  RAPID URINE DRUG SCREEN, HOSP PERFORMED - Abnormal; Notable for the following components:   Benzodiazepines POSITIVE (*)    Tetrahydrocannabinol POSITIVE (*)    All other components within normal limits  ACETAMINOPHEN  LEVEL - Abnormal; Notable for the following components:   Acetaminophen  (Tylenol ), Serum <10 (*)    All other components within normal limits  SALICYLATE LEVEL - Abnormal; Notable for the following components:   Salicylate Lvl <7.0 (*)    All other components within normal limits  HCG, SERUM, QUALITATIVE  ETHANOL    Notable for as above  EKG   EKG Interpretation Date/Time:    Ventricular Rate:    PR Interval:    QRS Duration:    QT Interval:    QTC Calculation:   R Axis:      Text Interpretation:           Imaging Studies  ordered: na   Medicines ordered and prescription drug management: No orders of the defined types were placed in this encounter.   -I have reviewed the patients home medicines and have made adjustments as needed   Consultations Obtained: I requested consultation with the TTS   Cardiac Monitoring:  Continuous pulse oximetry interpreted by myself, 98% on RA.    Social Determinants of Health:  Diagnosis or treatment significantly limited by social determinants of health: current smoker, obesity    Reevaluation: After the interventions noted above, I reevaluated the patient and found that they have stayed the same  Co morbidities that complicate the patient evaluation  Past Medical History:  Diagnosis Date   Anxiety 10/22/2017   Chlamydia infection during pregnancy 02/03/2017   Deliberate self-cutting 10/22/2017   Age 50-18   Dyspnea    Morbid obesity (HCC) 06/01/2017   Postpartum depression 10/22/2017      Dispostion: Disposition decision including need for hospitalization was considered, and patient disposition pending at time of sign out.    Final Clinical Impression(s) / ED Diagnoses Final diagnoses:  Suicidal ideation        Teddi Favors, DO 10/07/23 1439

## 2023-10-07 NOTE — ED Notes (Signed)
 Envelope Number: [id number] The IVC documents have been uploaded to the patient's chart.  Awaiting acceptance from the [facility].  A copy of the IVC documents are located on the clipboard in the blue zone.  IVC is in processing.

## 2023-10-07 NOTE — ED Notes (Signed)
PT is asleep at this time 

## 2023-10-07 NOTE — ED Notes (Signed)
 Resubsmitted 5:31 CALLED MAGISTRATED NEW  ENVELOPED [id number]

## 2023-10-07 NOTE — Progress Notes (Signed)
 Pt was accepted to [facility] 10/07/2023 Bed assignment:[id number]  Pt meets inpatient criteria per Geneva Kerbs, NP  Attending Physician will be: Dr.Jadepalle    Report can be called to: [phone]  Pt can arrive after pending labs      Care Team Notified:  [facility] Loretta Romp, Heyward Loud, NP, Roxine Cordia, RN

## 2023-10-07 NOTE — ED Triage Notes (Signed)
 Patient c/o suicidal thoughts x 2 months with plan.  Patient denies HI, and A/V hallucinations.  Patient endorses history of bipolar disorder and is non-compliant with medications. Patient gives verbal consent for MSE.

## 2023-10-07 NOTE — ED Notes (Signed)
Sitter arrived

## 2023-10-07 NOTE — ED Notes (Signed)
 Still waiting on acceptance  clerk called twice.

## 2023-10-07 NOTE — ED Notes (Signed)
 Belongings locked in purple locker #4

## 2023-10-07 NOTE — ED Notes (Signed)
 Valuables locked with security (phone & wallet)

## 2023-10-07 NOTE — ED Notes (Signed)
 EKG done and given to secretary to scan in chart

## 2023-10-07 NOTE — ED Notes (Signed)
 [facility] @ [phone] to see if tx was available for pt who is now [facility]. I spoke w/ Rich Champ who will nw be checking with supervisor around their ability to p/u.

## 2023-10-07 NOTE — ED Notes (Signed)
 Please updated mother 68

## 2023-10-07 NOTE — Consult Note (Signed)
 [facility] Psychiatric Consult Initial  Patient Name: .Casey Fuentes  MRN: [id number]  DOB: [birth year]  Consult Order details:  Orders (From admission, onward)     Start     Ordered   10/07/23 1440  CONSULT TO CALL ACT TEAM       Ordering Provider: Teddi Favors, DO  Provider:  (Not yet assigned)  Question:  Reason for Consult?  Answer:  Psych consult   10/07/23 1440             Mode of Visit: In person    Psychiatry Consult Evaluation  Service Date: October 07, 2023 LOS:  LOS: 0 days  Chief Complaint: Suicidal ideations   Primary Psychiatric Diagnoses  Bipolar I disorder, most recent episode depressed (HCC) 2.   Cannabis abuse 3.   Benzodiazepine abuse (HCC) 4.   Opiate abuse, episodic (HCC) 5.   GAD  Assessment   Casey Fuentes is a 28 y.o. Caucasian female with a past psychiatric history of bipolar 1 disorder, postpartum depression, unipolar major depression, anxiety state, insomnia, cocaine abuse, and cannabis abuse, with pertinent medical comorbidities/history that include none, who presented this encounter by way of self for the chief complaint of suicidal thoughts x 2 months with plan, thus psychiatry was consulted, for further specialty evaluation and recommendations.  Patient is currently IVC at this time, per EDP team, as well as medically clear.  Upon evaluation, patient presents with symptomology that is most consistent with the patient's current chronic illness course of bipolar 1 disorder, with current episode being severe and depressed, and GAD, in addition to cannabis abuse, benzodiazepine abuse, and opiate abuse episodic. Evidence of this is appreciable from the patient's endorsements of severe depressive symptomology, suicidal ideations, significant recent polysubstance abuse, and generalized anxiety symptomology.  Given the patient's endorsements of decompensation of her mental health, of which includes suicidal ideations, recommendation is for inpatient  mental health hospitalization, for safety and stabilization of the patient.   Discussed with patient trialing Vraylar, to which patient after extensive conversation, endorsed that she was amenable to this, no questions or concerns after review.  Will refrain at this time from placing the patient on any withdrawal monitoring, given patient's endorsement of largely episodic recent illicit substance abuse.  Diagnoses:  Active Hospital problems: Principal Problem:   Bipolar 1 disorder, depressed, severe (HCC) Active Problems:   GAD (generalized anxiety disorder)   Cannabis abuse   Benzodiazepine abuse (HCC)   Opiate abuse, episodic (HCC)    Plan   # Bipolar 1 disorder, depressed, severe (HCC) # Cannabis abuse # Benzodiazepine abuse # Opiate abuse, episodic #GAD  ## Psychiatric Medication Recommendations:   - Recommend start Vraylar 1.5 mg p.o. daily  ## Medical Decision Making Capacity: Not specifically addressed in this encounter  ## Further Work-up: Recommend EKG for QTc monitoring  ## Disposition:-- We recommend inpatient psychiatric hospitalization when medically cleared. Patient is under voluntary admission status at this time; please IVC if attempts to leave hospital.  ## Behavioral / Environmental: -Routine agitation/safety precautions    ## Safety and Observation Level:  - Based on my clinical evaluation, I estimate the patient to be at low risk of self harm in the current setting. - At this time, we recommend  routine. This decision is based on my review of the chart including patient's history and current presentation, interview of the patient, mental status examination, and consideration of suicide risk including evaluating suicidal ideation, plan, intent, suicidal or self-harm behaviors, risk  factors, and protective factors. This judgment is based on our ability to directly address suicide risk, implement suicide prevention strategies, and develop a safety plan while the  patient is in the clinical setting. Please contact our team if there is a concern that risk level has changed.  CSSR Risk Category:C-SSRS RISK CATEGORY: High Risk  Suicide Risk Assessment: Patient has following modifiable risk factors for suicide: untreated depression, medication noncompliance, and lack of access to outpatient mental health resources, which we are addressing by treatment recommendations. Patient has following non-modifiable or demographic risk factors for suicide: separation or divorce, history of suicide attempt, history of self harm behavior, and psychiatric hospitalization Patient has the following protective factors against suicide: Supportive family, Supportive friends, Minor children in the home, and Frustration tolerance  Thank you for this consult request. Recommendations have been communicated to the primary team.  We will continue to follow at this time.   Volanda Gruber, NP       History of Present Illness   Casey Fuentes is a 28 y.o. Caucasian female with a past psychiatric history of bipolar 1 disorder, postpartum depression, unipolar major depression, anxiety state, insomnia, cocaine abuse, and cannabis abuse, with pertinent medical comorbidities/history that include none, who presented this encounter by way of self for the chief complaint of suicidal thoughts x 2 months with plan, thus psychiatry was consulted, for further specialty evaluation and recommendations.  Patient is currently voluntary at this time, per EDP team, as well as medically clear.  Patient seen today at the [facility] emergency department for face-to-face psychiatric evaluation.  Upon evaluation, patient endorses that she brought herself in, because over the last approximately 5 months, has been progressively decompensating in her mental health, in the form of worsening depression and anxiety, and has now over the last 2 months, developed suicidal thoughts that have now recently today  developed into a specific plan of wanting to overdose on fentanyl , in the context of psychosocial stressors and substance abuse.  Expanding on psychosocial stressors, patient endorses that around 5 months ago she moved in with her mother to take care of her after having a liver transplant, of which resulted in a need to quit her job and no longer work, and since then, she states,  I have just been flat out depressed, I'm struggling with my anxiety, and I'm just not wanting to be alive anymore.  In addition to psychosocial stress around moving in with her mother to take care of her, and no longer working, patient endorses that her stepmother died sometime last year, of which to this day, she states this bothers her at times and makes her feel sad, especially because of how much more free time she has on her hands, and states that, I just start thinking about all kinds of things, and I just get anxious, and sad, and it is just awful.  Patient endorses that due to psychosocial stressors, as it relates to depressive symptomology, patient endorses that she has been experiencing progressively worsening depressive mood, anhedonia, troubles with concentration, feelings of worthlessness, irritability/agitation with others, excessive sleeping, very poor appetite, and suicidal ideations.  Patient endorses as it relates to anxious symptomology, she has been progressively experiencing worsening troubles with feeling nervous, anxious, and on edge, troubles with controlling a variety of generalized worry, experiencing trouble relaxing, and feeling at times that something awful might happen.  Patient denies any panic attacks.  Expanding on substance abuse recently that has been causing some problems  for the patient, patient endorses that she has been abusing Xanax in the form of about a half a tablet every 2 or 3 days or so, has been infrequently utilizing Suboxone strips, and has been smoking cannabis just about  daily.  Patient denies any other recent drug use, as well as denies any recent EtOH use.  UDS appreciably positive for benzodiazepines and cannabinoids; BAL unremarkable.  Expanding on cannabis use, patient endorses that she is smoking about a couple grams most days.  Patient endorses that she receives these substances from friends who give them to her for free, as well as endorses that she will at times donate plasma to afford purchasing these substances.  Patient endorses that she utilizes the illicit substances to relax.  Patient endorses that she has been abusing the aforementioned substances probably since I moved in with my mom I would say.  Prompted to expand on historical substance abuse or use, patient endorses a history of cocaine and Molly use in the past, as well as opiate pain pill abuse, by way of purchasing them illegally. Patient endorses additionally a recent history of abusing fentanyl  last year pretty heavily when she was living in [place] with her dad, but states that she has since discontinued this illicit substance.  Patient endorses that she started smoking cannabis around the age of 69.  Patient endorses that she is not on any medications for her mental health, states that she has trialed medications in the past with some success, and others not so much, states that she is able to remember Seroquel not being a good fit for her because it caused sedation, and Abilify  not being a good fit because it caused akathisia, but states that medication such as Zoloft  and Lexapro  were sort of neutral.  Patient endorses she is not seeing a outpatient psychiatrist and/or therapist, but states that in the past about a year ago for short period of time she had been seeing a psychiatrist and therapist virtually for a while, does not remember the agency.  Patient endorses a history of x 1 inpatient mental health hospitalization, and states that she has a psychiatric history of postpartum  depression, bipolar, depression, anxiety, and substance abuse.  Patient orientation is intact, no concerns for fluctuations in consciousness.  Patient endorses formally during evaluation that her suicidal thoughts are passive, states that she has thoughts of wishing she was currently not alive.  Patient endorses a history of x 1 suicide attempt, attempted to overdose several years ago.  Patient endorses no homicidal ideations.  Patient endorses no auditory or visual hallucinations, and objectively, does not appear to be presenting with psychotic features.  Patient endorses her mood currently is, no energy, and presents with a sad and reserved interpersonal style, with a neutral affect with sad edge, and fair eye contact.  Patient endorses daily nicotine  use by way of vape.  Patient endorses no current self injury, but endorses a past history of self-injurious behavior since she was younger, none in recent years.  Patient endorses no history of rehab, but endorses x 1 participating in Narcotics Anonymous.  Discussed with patient that given evaluation conducted, recommendation would be for inpatient mental health hospitalization, for safety and stabilization, to which patient endorsed that she was amenable to this.  Additionally discussed medications that could be efficacious in promoting improvement of the patient's mental health, to which after review of medications, endorse that she was amenable to trialing Vraylar.   Review of Systems  Constitutional:  Positive for malaise/fatigue.  Psychiatric/Behavioral:  Positive for depression, substance abuse and suicidal ideas (Passive, thoughts of wishing currently to not be alive). Negative for hallucinations. The patient is nervous/anxious. The patient does not have insomnia.   All other systems reviewed and are negative.    Psychiatric and Social History  Psychiatric History:  Information collected from chart review/patient  Prev Dx/Sx: bipolar 1  disorder, postpartum depression, unipolar major depression, anxiety state, insomnia, cocaine abuse, and cannabis abuse Current Psych Provider: None Home Meds (current): None Previous Med Trials: Abilify  (caused akathisia), Seroquel (too much sedation), Zoloft  (neutral), Lexapro  (neutral) Therapy: None currently, but has participated historically last year for a small period of time virtually  Prior Psych Hospitalization: Yes, [facility] 2023 Prior Self Harm: Yes, history of self injury, as well as x 1 suicide attempt about 8 years ago, attempted to overdose on mother's blood pressure medicine Prior Violence: None endorsed  Family Psych History: Mother has depression, maternal grandmother has alcohol abuse and narcotics abuse Family Hx suicide: None endorsed  Social History:  Developmental Hx: None endorsed Educational Hx: None endorsed Occupational Hx: Unemployed Legal Hx: None endorsed Living Situation: Lives with mother Spiritual Hx: None endorsed Access to weapons/lethal means: None endorsed  Substance History Alcohol: None endorsed Tobacco: Daily, vapes Illicit drugs: Yes, current Xanax use, Suboxone use, and cannabis use (see above for specifics); history of cocaine use, Molly use, ecstasy use, opiate pain medication pill use Prescription drug abuse: Yes, Xanax, opiate pain medication pill use Rehab hx: None endorsed  Exam Findings  Physical Exam: As below Vital Signs:  Temp:  [98.1 F (36.7 C)-98.3 F (36.8 C)] 98.1 F (36.7 C) (06/20 1125) Pulse Rate:  [83-98] 83 (06/20 1125) Resp:  [18] 18 (06/20 1125) BP: (121-127)/(79-89) 121/79 (06/20 1125) SpO2:  [97 %-98 %] 97 % (06/20 1125) Weight:  [99.8 kg] 99.8 kg (06/20 1124) Blood pressure 121/79, pulse 83, temperature 98.1 F (36.7 C), resp. rate 18, weight 99.8 kg, SpO2 97%. Body mass index is 41.57 kg/m.  Physical Exam Vitals and nursing note reviewed.  Constitutional:      General: She is not in acute distress.     Appearance: She is obese. She is not ill-appearing, toxic-appearing or diaphoretic.     Comments: Reserved and sad interpersonal style   Pulmonary:     Effort: Pulmonary effort is normal.   Skin:    General: Skin is warm and dry.   Neurological:     Mental Status: She is alert and oriented to person, place, and time.     Motor: No weakness, tremor or seizure activity.   Psychiatric:        Attention and Perception: Attention and perception normal. She does not perceive auditory or visual hallucinations.        Mood and Affect: Mood is depressed.        Speech: Speech normal.        Behavior: Behavior is withdrawn. Behavior is cooperative.        Thought Content: Thought content is not paranoid or delusional. Thought content includes suicidal (Currently passive, but reports initially active thoughts of wanting to overdose on fentanyl ) ideation. Thought content does not include homicidal ideation.        Cognition and Memory: Cognition and memory normal.        Judgment: Judgment normal.     Comments: Affect: Neutral with sad edge    Mental Status Exam: General Appearance: Obese Caucasian female in casual close with  reserved and sad interpersonal style  Orientation:  Full (Time, Place, and Person)  Memory:  Within defined limits  Concentration:  Concentration: Fair and Attention Span: Fair  Recall:  Fair  Attention  Fair  Eye Contact:  Fair  Speech:  Clear and Coherent  Language:  Fair  Volume:  Normal  Mood: Depressed  Affect:  Neutral with sad edge  Thought Process:  Coherent, Goal Directed, and Linear  Thought Content:  Negative and Logical  Suicidal Thoughts:  Yes.  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  Family History:  The patient's family history includes Cirrhosis in her mother; Diabetes in her mother; Healthy in her father; Thyroid  disease in her mother.  Medical History: Past Medical History:  Diagnosis Date   Anxiety 10/22/2017   Chlamydia infection during pregnancy 02/03/2017   Deliberate self-cutting 10/22/2017   Age 22-18   Dyspnea    Morbid obesity (HCC) 06/01/2017   Postpartum depression 10/22/2017    Surgical History: Past Surgical History:  Procedure Laterality Date   CESAREAN SECTION N/A 08/02/2017   Procedure: CESAREAN SECTION;  Surgeon: Teresa Fender, MD;  Location: [facility] ORS;  Service: Obstetrics;  Laterality: N/A;   TONSILLECTOMY       Medications:   Current Facility-Administered Medications:    cariprazine (VRAYLAR) capsule 1.5 mg, 1.5 mg, Oral, Daily, Volanda Gruber, NP  Current Outpatient Medications:    ARIPiprazole  (ABILIFY ) 10 MG tablet, Take 1 tablet (10 mg total) by mouth daily. (Patient not taking: Reported on 10/07/2023), Disp: 30 tablet, Rfl: 0   escitalopram  (LEXAPRO ) 10 MG tablet, Take 1 tablet (10 mg total) by mouth at bedtime. (Patient not taking: Reported on 10/07/2023), Disp: 30 tablet, Rfl: 0   lamoTRIgine (LAMICTAL) 25 MG tablet, Take 25 mg by mouth daily. (Patient not taking: Reported on 10/07/2023), Disp: , Rfl:    nicotine  (NICODERM CQ  - DOSED IN MG/24 HOURS) 21 mg/24hr patch, Place 1 patch (21 mg total) onto the skin daily as needed (smoking cessation). (Patient not taking: Reported on 10/07/2023), Disp: 28 patch, Rfl: 0  Allergies: Allergies  Allergen Reactions   Amoxicillin Other (See Comments)    Unknown reaction, childhood allergy    Volanda Gruber, NP

## 2023-10-07 NOTE — ED Provider Triage Note (Signed)
 Emergency Medicine Provider Triage Evaluation Note  Casey Fuentes , a 28 y.o. female  was evaluated in triage.  Pt complains of depression x 4 months. Patient states she has a history of bipolar disorder and notes it's more extreme than in the past. Not taking medication and is supposed to be taking seroquel, stopped bc it made her so drowsy. No HI/AH/VH. Endorses intermittent derealization. Endorses suicidal ideation. Endorses plan to OD. Has tried to OD in the past. No physical complaints or medical concerns.   Review of Systems  Positive: SI Negative: HI/AH/VH  Physical Exam  BP 121/79   Pulse 83   Temp 98.1 F (36.7 C)   Resp 18   Wt 99.8 kg   SpO2 97%   BMI 41.57 kg/m  Gen:   Awake, no distress   Resp:  Normal effort  MSK:   Moves extremities without difficulty  Other:  Calm, cooperative  Medical Decision Making  Medically screening exam initiated at 11:51 AM.  Appropriate orders placed.  Casey Fuentes was informed that the remainder of the evaluation will be completed by another provider, this initial triage assessment does not replace that evaluation, and the importance of remaining in the ED until their evaluation is complete.    Merdis Stalling, MD 10/07/23 [phone]

## 2023-10-07 NOTE — ED Notes (Signed)
 PT is comfortable and resting in bed at this time.

## 2023-10-08 NOTE — ED Notes (Signed)
PT is asleep at this time 

## 2023-10-08 NOTE — ED Notes (Signed)
PT ambulated to the bathroom.

## 2023-10-08 NOTE — ED Notes (Signed)
PT is asleep.  

## 2023-10-08 NOTE — ED Provider Notes (Signed)
 Emergency Medicine Observation Re-evaluation Note  Casey Fuentes is a 28 y.o. female, seen on rounds today.  Pt initially presented to the ED for complaints of Suicidal Currently, the patient is sleeping.  Physical Exam  BP 104/68 (BP Location: Right Arm)   Pulse 74   Temp 98.3 F (36.8 C) (Oral)   Resp 16   Wt 99.8 kg   SpO2 99%   BMI 41.57 kg/m  Physical Exam General: Sleeping Cardiac: Extremities well-perfused Lungs: Breathing is unlabored Psych: Deferred  ED Course / MDM  EKG:   I have reviewed the labs performed to date as well as medications administered while in observation.  Recent changes in the last 24 hours include presentation to the emergency department yesterday for suicidal ideation with plan.  She has been medically cleared.  She was evaluated by TTS who recommends inpatient psychiatric hospitalization.  She has been accepted at [facility] with accepting physician Dr. Evelia.  Plan for transfer this morning.  Plan  Current plan is for inpatient psychiatric admission.    Melvenia Motto, MD 10/08/23 [phone]

## 2023-10-08 NOTE — ED Notes (Signed)
 IVC/d 10/07/23, exp 10/14/23; IVC Case # [id number]

## 2023-10-08 NOTE — ED Notes (Signed)
Pt ambulated to bathroom and back

## 2023-10-08 NOTE — ED Notes (Signed)
 Called [facility] [facility] [employer] to arrange transport to [facility]. Transport will be Sunday after 9 am.

## 2023-10-08 NOTE — ED Notes (Signed)
 Pt care taken, no complaints at this time. Resting is aware of her bed at [facility] and will be transported tomorrow.after 9 am.

## 2023-10-09 ENCOUNTER — Encounter: Payer: Self-pay | Admitting: Psychiatry

## 2023-10-09 ENCOUNTER — Inpatient Hospital Stay
Admission: AD | Admit: 2023-10-09 | Discharge: 2023-10-14 | DRG: 885 | Disposition: A | Discharge status: Home / Self Care | Source: Intra-hospital | Attending: Psychiatry | Admitting: Psychiatry

## 2023-10-09 ENCOUNTER — Other Ambulatory Visit: Payer: Self-pay

## 2023-10-09 DIAGNOSIS — F1990 Other psychoactive substance use, unspecified, uncomplicated: Secondary | ICD-10-CM | POA: Diagnosis present

## 2023-10-09 DIAGNOSIS — F314 Bipolar disorder, current episode depressed, severe, without psychotic features: Secondary | ICD-10-CM | POA: Diagnosis present

## 2023-10-09 DIAGNOSIS — Z9152 Personal history of nonsuicidal self-harm: Secondary | ICD-10-CM

## 2023-10-09 DIAGNOSIS — F1210 Cannabis abuse, uncomplicated: Secondary | ICD-10-CM | POA: Diagnosis present

## 2023-10-09 DIAGNOSIS — F411 Generalized anxiety disorder: Secondary | ICD-10-CM | POA: Diagnosis present

## 2023-10-09 DIAGNOSIS — Z833 Family history of diabetes mellitus: Secondary | ICD-10-CM

## 2023-10-09 DIAGNOSIS — G4700 Insomnia, unspecified: Secondary | ICD-10-CM | POA: Diagnosis present

## 2023-10-09 DIAGNOSIS — R45851 Suicidal ideations: Secondary | ICD-10-CM | POA: Diagnosis present

## 2023-10-09 DIAGNOSIS — R630 Anorexia: Secondary | ICD-10-CM | POA: Diagnosis present

## 2023-10-09 DIAGNOSIS — F3130 Bipolar disorder, current episode depressed, mild or moderate severity, unspecified: Secondary | ICD-10-CM | POA: Diagnosis not present

## 2023-10-09 MED ORDER — ALUM & MAG HYDROXIDE-SIMETH 200-200-20 MG/5ML PO SUSP: 30.0000 mL | ORAL | Status: DC | PRN

## 2023-10-09 MED ORDER — CARIPRAZINE HCL 1.5 MG PO CAPS
1.5000 mg | ORAL_CAPSULE | Freq: Every day | ORAL | Status: DC
  Administered 2023-10-09 – 2023-10-12 (×4): 1.5 mg via ORAL
  Filled 2023-10-09 (×4): qty 1

## 2023-10-09 MED ORDER — HALOPERIDOL LACTATE 5 MG/ML IJ SOLN
5.0000 mg | Freq: Three times a day (TID) | INTRAMUSCULAR | Status: DC | PRN
Start: 2023-10-09 — End: 2023-10-14

## 2023-10-09 MED ORDER — LORAZEPAM 2 MG/ML IJ SOLN: 2.0000 mg | Freq: Three times a day (TID) | INTRAMUSCULAR | Status: DC | PRN

## 2023-10-09 MED ORDER — MAGNESIUM HYDROXIDE 400 MG/5ML PO SUSP: 30.0000 mL | Freq: Every day | ORAL | Status: DC | PRN

## 2023-10-09 MED ORDER — HALOPERIDOL 5 MG PO TABS
5.0000 mg | ORAL_TABLET | Freq: Three times a day (TID) | ORAL | Status: DC | PRN
Start: 2023-10-09 — End: 2023-10-14

## 2023-10-09 MED ORDER — ACETAMINOPHEN 325 MG PO TABS
650.0000 mg | ORAL_TABLET | Freq: Four times a day (QID) | ORAL | Status: DC | PRN
  Administered 2023-10-09 – 2023-10-14 (×6): 650 mg via ORAL
  Filled 2023-10-09 (×6): qty 2

## 2023-10-09 MED ORDER — DIPHENHYDRAMINE HCL 50 MG/ML IJ SOLN
50.0000 mg | Freq: Three times a day (TID) | INTRAMUSCULAR | Status: DC | PRN
Start: 2023-10-09 — End: 2023-10-14

## 2023-10-09 MED ORDER — TRAZODONE HCL 50 MG PO TABS
50.0000 mg | ORAL_TABLET | Freq: Every evening | ORAL | Status: DC | PRN
Start: 2023-10-09 — End: 2023-10-14
  Administered 2023-10-09 – 2023-10-13 (×5): 50 mg via ORAL
  Filled 2023-10-09 (×5): qty 1

## 2023-10-09 MED ORDER — DIPHENHYDRAMINE HCL 25 MG PO CAPS
50.0000 mg | ORAL_CAPSULE | Freq: Three times a day (TID) | ORAL | Status: DC | PRN
Start: 2023-10-09 — End: 2023-10-14
  Administered 2023-10-10 – 2023-10-13 (×4): 50 mg via ORAL
  Filled 2023-10-09 (×4): qty 2

## 2023-10-09 MED ORDER — NICOTINE 21 MG/24HR TD PT24: 21.0000 mg | MEDICATED_PATCH | Freq: Every day | TRANSDERMAL | Status: DC

## 2023-10-09 MED ORDER — HALOPERIDOL LACTATE 5 MG/ML IJ SOLN
10.0000 mg | Freq: Three times a day (TID) | INTRAMUSCULAR | Status: DC | PRN
Start: 2023-10-09 — End: 2023-10-14

## 2023-10-09 MED ORDER — LORAZEPAM 2 MG/ML IJ SOLN
2.0000 mg | Freq: Three times a day (TID) | INTRAMUSCULAR | Status: DC | PRN
Start: 2023-10-09 — End: 2023-10-14

## 2023-10-09 MED ORDER — NICOTINE 21 MG/24HR TD PT24
21.0000 mg | MEDICATED_PATCH | Freq: Every day | TRANSDERMAL | Status: DC
  Administered 2023-10-09 – 2023-10-12 (×4): 21 mg via TRANSDERMAL
  Filled 2023-10-09 (×4): qty 1

## 2023-10-09 NOTE — Group Note (Signed)
 Date:  10/09/2023 Time:  4:24 PM  Group Topic/Focus:  Making Healthy Choices:   The focus of this group is to help patients identify negative/unhealthy choices they were using prior to admission and identify positive/healthier coping strategies to replace them upon discharge.    Participation Level:  Active  Participation Quality:  Appropriate  Affect:  Appropriate  Cognitive:  Appropriate  Insight: Appropriate  Engagement in Group:  Engaged  Modes of Intervention:  Socialization  Additional Comments:  n/a  Marteze Vecchio 10/09/2023, 4:24 PM

## 2023-10-09 NOTE — Progress Notes (Signed)
   10/09/23 1100  Psych Admission Type (Psych Patients Only)  Admission Status Involuntary  Psychosocial Assessment  Patient Complaints Anxiety;Depression  Eye Contact Fair  Facial Expression Animated  Affect Anxious  Speech Rapid  Interaction Assertive  Motor Activity Fidgety  Appearance/Hygiene In scrubs  Behavior Characteristics Cooperative;Anxious  Mood Anxious  Thought Process  Coherency WDL  Content WDL  Delusions WDL  Perception WDL  Hallucination None reported or observed  Judgment Impaired  Confusion WDL  Danger to Self  Current suicidal ideation? Passive  Agreement Not to Harm Self Yes  Danger to Others  Danger to Others None reported or observed

## 2023-10-09 NOTE — [facility] Suicide Risk Assessment (Signed)
 [facility] Admission Suicide Risk Assessment   Nursing information obtained from:    Demographic factors:    Current Mental Status:    Loss Factors:    Historical Factors:    Risk Reduction Factors:     Total Time spent with patient: 1 hour Principal Problem: Bipolar 1 disorder, depressed, severe (HCC) Diagnosis:  Principal Problem:   Bipolar 1 disorder, depressed, severe (HCC)  Subjective Data: Casey Fuentes is a 27 y.o. Caucasian female with a past psychiatric history of bipolar 1 disorder, postpartum depression, unipolar major depression, anxiety state, insomnia, cocaine abuse, and cannabis abuse, with pertinent medical comorbidities/history that include none, who presented this encounter by way of self for the chief complaint of suicidal thoughts x 2 months with plan   Continued Clinical Symptoms:    The Alcohol Use Disorders Identification Test, Guidelines for Use in Primary Care, Second Edition.  World Science writer [facility]). Score between 0-7:  no or low risk or alcohol related problems. Score between 8-15:  moderate risk of alcohol related problems. Score between 16-19:  high risk of alcohol related problems. Score 20 or above:  warrants further diagnostic evaluation for alcohol dependence and treatment.   CLINICAL FACTORS:   Severe Anxiety and/or Agitation Bipolar Disorder:   Depressive phase Depression:   Anhedonia Hopelessness Severe Previous Psychiatric Diagnoses and Treatments   Musculoskeletal: Strength & Muscle Tone: within normal limits Gait & Station: normal Patient leans: N/A  Psychiatric Specialty Exam:  Presentation  General Appearance:  Appropriate for Environment  Eye Contact: Fair  Speech: Clear and Coherent  Speech Volume: Normal  Handedness: Right   Mood and Affect  Mood: Depressed  Affect: Flat   Thought Process  Thought Processes: Coherent; Linear  Descriptions of Associations:Intact  Orientation:Full (Time, Place  and Person)  Thought Content:Logical  History of Schizophrenia/Schizoaffective disorder:No data recorded Duration of Psychotic Symptoms:No data recorded Hallucinations:Hallucinations: None  Ideas of Reference:None  Suicidal Thoughts:Suicidal Thoughts: Yes, Active SI Active Intent and/or Plan: With Plan  Homicidal Thoughts:Homicidal Thoughts: No   Sensorium  Memory: Immediate Good  Judgment: Poor  Insight: Poor   Executive Functions  Concentration: Fair  Attention Span: Fair  Recall: [employer] of Knowledge: Fair  Language: Fair   Psychomotor Activity  Psychomotor Activity: Psychomotor Activity: Normal   Assets  Assets: Manufacturing systems engineer; Desire for Improvement   Sleep  Sleep:No data recorded   Physical Exam: Physical Exam Vitals and nursing note reviewed.  HENT:     Head: Atraumatic.   Eyes:     Extraocular Movements: Extraocular movements intact.   Pulmonary:     Effort: Pulmonary effort is normal.   Neurological:     Mental Status: She is alert and oriented to person, place, and time.    Review of Systems  Neurological:  Negative for tremors.  Psychiatric/Behavioral:  Positive for depression, substance abuse and suicidal ideas. Negative for hallucinations. The patient is nervous/anxious. The patient does not have insomnia.    There were no vitals taken for this visit. There is no height or weight on file to calculate BMI.   COGNITIVE FEATURES THAT CONTRIBUTE TO RISK:  None    SUICIDE RISK:   Severe:  Frequent, intense, and enduring suicidal ideation, specific plan, no subjective intent, but some objective markers of intent (i.e., choice of lethal method), the method is accessible, some limited preparatory behavior, evidence of impaired self-control, severe dysphoria/symptomatology, multiple risk factors present, and few if any protective factors, particularly a lack of social support.  PLAN OF CARE:  Safety and Monitoring:    --  Admission to inpatient psychiatric unit for safety, stabilization and treatment -- Daily contact with patient to assess and evaluate symptoms and progress in treatment -- Patient's case to be discussed in multi-disciplinary team meeting -- Observation Level : q15 minute checks -- Vital signs:  q12 hours -- Precautions: suicide   I certify that inpatient services furnished can reasonably be expected to improve the patient's condition.   Donnice FORBES Right, PA-C 10/09/2023, 3:10 PM

## 2023-10-09 NOTE — Plan of Care (Signed)
  Problem: Education: Goal: Knowledge of [facility] Education information/materials will improve 10/09/2023 2030 by Trudy Arland CROME, RN Outcome: Progressing 10/09/2023 2030 by Trudy Arland CROME, RN Outcome: Not Progressing   Problem: Education: Goal: Verbalization of understanding the information provided will improve 10/09/2023 2030 by Trudy Arland CROME, RN Outcome: Progressing 10/09/2023 2030 by Trudy Arland CROME, RN Outcome: Not Progressing

## 2023-10-09 NOTE — ED Notes (Signed)
 Report called to [facility]. Spoke with Arland, RN. Updated room number to 311.

## 2023-10-09 NOTE — H&P (Signed)
 Psychiatric Admission Assessment Adult  Patient Identification: Casey Fuentes MRN:  [id number] Date of Evaluation:  10/09/2023 Chief Complaint:  Bipolar 1 disorder, depressed, severe (HCC) [F31.4]   History of Present Illness:   The patient is a 28 year old Caucasian female with a psychiatric history of bipolar I disorder, postpartum depression, unipolar depression, anxiety, insomnia, and substance use disorders who presented voluntarily for psychiatric evaluation due to worsening suicidal ideation over the past two months. She reports that her mental health has been progressively decompensating over the past five months, coinciding with multiple psychosocial stressors and substance use. She states that she brought herself to the hospital because her depression and anxiety have worsened significantly, and on the day of presentation she developed a specific plan to overdose on fentanyl . She describes her depression as "feeling nothing," rating it 6-7/10. She reports long-standing struggles with depression and anxiety, and notes that since moving in with her mother five months ago to help care for her post-liver transplant, she has had to quit her job, which has contributed to worsening symptoms. She also describes unresolved grief related to the death of her stepmother last year, stating that with more unstructured time she finds herself ruminating and feeling increasingly anxious and sad.  The patient endorses depressive symptoms including anhedonia, low energy, poor concentration, feelings of worthlessness, irritability, excessive sleep, and poor appetite. She inconsistently reports one prior suicide attempt via overdose several years ago, denied to this provider but was noted on ed interview. She denies current self-harm but notes a history of self-injury in adolescence. She describes anxiety symptoms including persistent nervousness, restlessness, and difficulty relaxing, though denies panic  attacks. She denies current homicidal ideation, hallucinations, or delusions, and was not acutely manic or psychotic on exam. She reports her mood as "no energy," and her affect was flat.   The patient also identifies family dynamics as a source of stress, particularly a lack of a close relationship with her father, although she states that they "get along well."   The patient has a psychiatric diagnosis of bipolar disorder made in 2023, and has a history of one prior psychiatric hospitalization for depression. She recalls trial of Seroquel in the past, which she found sedating but somewhat effective. She also trialed Abilify  and Lexapro , but discontinued due to akathisia. Zoloft  and Lexapro  are also found on chart review. She has not taken any psychiatric medications for the past year and is not currently followed by a provider. She was most recently seen by a provider in [place]. The ED provider started her on Vraylar , which she reports tolerating well without adverse effects. She has no sleep difficulties and denies any current medical diagnoses.  In addition to psychiatric symptoms, the patient has a history of substance use, including ongoing Xanax use (2-3 times weekly, most recent use on 6/18), intermittent Suboxone use (2-3 times weekly), and gives conflicting reports on marijuana use noting daily with ed provider but weekly on admission. She reports prior fentanyl  use, most recently a year ago. She also endorses past cocaine, Molly, and opioid use. She reports using substances primarily to relax and manage stress. UDS was positive for benzodiazepines and cannabinoids; BAL was negative. She denies any current alcohol use. She does not report current sleep difficulties. She denies a history of seizures or withdrawal symptoms.She has never participated in formal substance use treatment, but did attend Narcotics Anonymous in the past. She reports daily nicotine  use via vaping, stating she will fall  asleep with her vape in  her mouth, is provided nicotine  patch.   There is unclear hx of mania/hypomania there was paranoia noted on prior admission. Not pregnant and no plans on pregnancy.  Total Time spent with patient: 1 hour Sleep  Sleep:No data recorded Past Psychiatric History:   Psychiatric History:  Information collected from pt and chart review  Prev Dx/Sx: Bipolar I Disorder, Major Depressive Disorder, Postpartum Depression, Anxiety Disorder, Insomnia, Substance Use Disorders (Cannabis, Benzodiazepine, Opioid) Current Psych Provider: none Home Meds (current): none Previous Med Trials: Seroquel (sedation), Abilify  (akathisia), Lexapro  (neutral), Zoloft  (neutral) Therapy: none  Prior Psych Hospitalization: [facility] 8/23 Prior Self Harm: OD several years ago, and cutting in adolescence  Prior Violence: none reported  Family Psych History: none reported Family Hx suicide: none reported  Social History:  Developmental Hx: none Educational Hx: GED Occupational Hx: none Legal Hx: none Living Situation: with mother Spiritual Hx: Christian  Access to weapons/lethal means: no   Substance History Alcohol: no   Tobacco: vaping daily frequently  Illicit drugs/prescription abuse: Marijuana, Xanax, Suboxone Rehab hx: none Is the patient at risk to self? Yes.    Has the patient been a risk to self in the past 6 months? No.  Has the patient been a risk to self within the distant past? Yes.    Is the patient a risk to others? No.  Has the patient been a risk to others in the past 6 months? No.  Has the patient been a risk to others within the distant past? No.   [place] Scale:  Flowsheet Row ED from 10/07/2023 in [facility] Emergency Department at [facility] ED from 09[facility]25/2023 in [facility] Emergency Department at [facility] Admission (Discharged[facility]  C-SSRS RISK CATEGORY High Risk No Risk High Risk      Past Medical History:  Past Medical History:  Diagnosis Date   Anxiety 10/22/2017   Chlamydia infection during pregnancy 02/03/2017   Deliberate self-cutting 10/22/2017   Age 42-18   Dyspnea    Morbid obesity (HCC) 06/01/2017   Postpartum depression 10/22/2017    Past Surgical History:  Procedure Laterality Date   CESAREAN SECTION N/A 08/02/2017   Procedure: CESAREAN SECTION;  Surgeon: Connell Davies, MD;  Location: [facility] ORS;  Service: Obstetrics;  Laterality: N/A;   TONSILLECTOMY     Family History:  Family History  Problem Relation Age of Onset   Diabetes Mother    Thyroid  disease Mother    Cirrhosis Mother    Healthy Father     Social History:  Social History   Substance and Sexual Activity  Alcohol Use Not Currently   Comment: None currently     Social History   Substance and Sexual Activity  Drug Use Yes   Types: Marijuana, Cocaine, Benzodiazepines, Fentanyl , MDMA (Ecstacy), Hydrocodone, Oxycodone    Comment: Xanax about every two or three days; Suboxone strips about every couple days or so, not often      Allergies:   Allergies  Allergen Reactions   Amoxicillin Other (See Comments)    Unknown reaction, childhood allergy   Lab Results: No results found for this or any previous visit (from the past 48 hours).  Blood Alcohol level:  Lab Results  Component Value Date   [facility] <15 10/07/2023   ETH <10 11/28/2021    Metabolic Disorder Labs:  Lab Results  Component Value Date   HGBA1C 4.3 (L) 12/01/2021   MPG 76.71 12/01/2021   No results found  for: PROLACTIN Lab Results  Component Value Date   CHOL 136 12/01/2021   TRIG 35 12/01/2021   HDL 46 12/01/2021   CHOLHDL 3.0 12/01/2021   VLDL 7 12/01/2021   LDLCALC 83 12/01/2021    Current Medications: Current Facility-Administered Medications  Medication Dose Route Frequency Provider Last Rate Last Admin   acetaminophen  (TYLENOL ) tablet 650 mg  650 mg Oral Q6H PRN Mannie Jerel PARAS, NP        alum & mag hydroxide-simeth (MAALOX/MYLANTA) 200-200-20 MG/5ML suspension 30 mL  30 mL Oral Q4H PRN Mannie Jerel PARAS, NP       cariprazine  (VRAYLAR ) capsule 1.5 mg  1.5 mg Oral Daily Mannie Jerel PARAS, NP       haloperidol (HALDOL) tablet 5 mg  5 mg Oral TID PRN Mannie Jerel PARAS, NP       And   diphenhydrAMINE  (BENADRYL ) capsule 50 mg  50 mg Oral TID PRN Mannie Jerel PARAS, NP       haloperidol lactate (HALDOL) injection 5 mg  5 mg Intramuscular TID PRN Mannie Jerel PARAS, NP       And   diphenhydrAMINE  (BENADRYL ) injection 50 mg  50 mg Intramuscular TID PRN Mannie Jerel PARAS, NP       And   LORazepam (ATIVAN) injection 2 mg  2 mg Intramuscular TID PRN Mannie Jerel PARAS, NP       haloperidol lactate (HALDOL) injection 10 mg  10 mg Intramuscular TID PRN Mannie Jerel PARAS, NP       And   diphenhydrAMINE  (BENADRYL ) injection 50 mg  50 mg Intramuscular TID PRN Mannie Jerel PARAS, NP       And   LORazepam (ATIVAN) injection 2 mg  2 mg Intramuscular TID PRN Mannie Jerel PARAS, NP       magnesium  hydroxide (MILK OF MAGNESIA) suspension 30 mL  30 mL Oral Daily PRN Mannie Jerel PARAS, NP       nicotine  (NICODERM CQ  - dosed in mg/24 hours) patch 21 mg  21 mg Transdermal Q0600 Spike Desilets E, PA-C       traZODone  (DESYREL ) tablet 50 mg  50 mg Oral QHS PRN Mannie Jerel PARAS, NP       PTA Medications: Medications Prior to Admission  Medication Sig Dispense Refill Last Dose/Taking   ARIPiprazole  (ABILIFY ) 10 MG tablet Take 1 tablet (10 mg total) by mouth daily. (Patient not taking: Reported on 10/07/2023) 30 tablet 0    escitalopram  (LEXAPRO ) 10 MG tablet Take 1 tablet (10 mg total) by mouth at bedtime. (Patient not taking: Reported on 10/07/2023) 30 tablet 0    lamoTRIgine (LAMICTAL) 25 MG tablet Take 25 mg by mouth daily. (Patient not taking: Reported on 10/07/2023)      nicotine  (NICODERM CQ  - DOSED IN MG/24 HOURS) 21 mg/24hr patch Place 1 patch (21 mg total) onto the skin daily as needed (smoking cessation).  (Patient not taking: Reported on 10/07/2023) 28 patch 0     Psychiatric Specialty Exam:  Presentation  General Appearance: Appropriate for Environment  Eye Contact:Fair  Speech:Clear and Coherent  Speech Volume:Normal    Mood and Affect  Mood:Depressed  Affect:Flat   Thought Process  Thought Processes:Coherent; Linear  Descriptions of Associations:Intact  Orientation:Full (Time, Place and Person)  Thought Content:Logical  Hallucinations:Hallucinations: None  Ideas of Reference:None  Suicidal Thoughts:Suicidal Thoughts: Yes, Active SI Active Intent and/or Plan: With Plan  Homicidal Thoughts:Homicidal Thoughts: No   Sensorium  Memory:Immediate Good  Judgment:Poor  Insight:Poor  Executive Functions  Concentration:Fair  Attention Span:Fair  Recall:Fair  [employer] of Knowledge:Fair  Language:Fair   Psychomotor Activity  Psychomotor Activity:Psychomotor Activity: Normal   Assets  Assets:Communication Skills; Desire for Improvement    Musculoskeletal: Strength & Muscle Tone: within normal limits Gait & Station: normal  Physical Exam: Physical Exam Vitals and nursing note reviewed.  HENT:     Head: Atraumatic.   Eyes:     Extraocular Movements: Extraocular movements intact.   Pulmonary:     Effort: Pulmonary effort is normal.   Neurological:     Mental Status: She is alert and oriented to person, place, and time.   Psychiatric:        Behavior: Behavior normal.    Review of Systems  Psychiatric/Behavioral:  Positive for depression, substance abuse and suicidal ideas. Negative for hallucinations and memory loss. The patient is nervous/anxious. The patient does not have insomnia.    There were no vitals taken for this visit. There is no height or weight on file to calculate BMI.  Principal Diagnosis: Bipolar 1 disorder, depressed, severe (HCC) Diagnosis:  Principal Problem:   Bipolar 1 disorder, depressed, severe (HCC)   Clinical  Decision Making: 28 year old female with bipolar I disorder, depression, anxiety, and polysubstance use presenting with worsening depressive and anxiety symptoms, active SI with prior planning, and poor psychosocial functioning. Current stressors include caregiver burden, unemployment, substance use, and unresolved grief. Substance use complicates mood stabilization and increases risk. Inpatient care is indicated for safety, medication management, and stabilization.  Treatment Plan Summary:  Safety and Monitoring:             -- Voluntary admission to inpatient psychiatric unit for safety, stabilization and treatment             -- Daily contact with patient to assess and evaluate symptoms and progress in treatment             -- Patient's case to be discussed in multi-disciplinary team meeting             -- Observation Level: q15 minute checks             -- Vital signs:  q12 hours             -- Precautions: suicide, elopement, and assault   2. Psychiatric Diagnoses and Treatment:              Bipolar 1 disorder: currently depressed  - continue Vraylar  1.5 mg daily slow titration given history of akathisia, could consider adding Lithium or Lamictal if depressive symptoms not well controlled with monotherapy  Polysubstance use  - encourage abstinence from substance use. As pt progresses discussion about treatment programs.   Anxiety  - Encourage continued work on developing coping mechanisms to mitigate primary stressors.   Nicotine  Dependence   -- Nicoderm CQ  21 mg/24 hours transdermal  -- The risks/benefits/side-effects/alternatives to this medication were discussed in detail with the patient and time was given for questions. The patient consents to medication trial.                -- Metabolic profile and EKG monitoring obtained while on an atypical antipsychotic (BMI: Lipid Panel: HbgA1c: QTc:)   HgA1c, TSH, and Lipid Panel pending             -- Encouraged patient to participate in  unit milieu and in scheduled group therapies  3. Medical Issues Being Addressed:   No acute concerns noted.    4. Discharge Planning:              -- Social work and case management to assist with discharge planning and identification of hospital follow-up needs prior to discharge             -- Estimated LOS: 5-7 days             -- Discharge Concerns: Need to establish a safety plan; Medication compliance and effectiveness             -- Discharge Goals: Return home with outpatient referrals follow ups  Physician Treatment Plan for Primary Diagnosis: Bipolar 1 disorder, depressed, severe (HCC) Long Term Goal(s): Improvement in symptoms so as ready for discharge  Short Term Goals: Ability to verbalize feelings will improve, Ability to disclose and discuss suicidal ideas, Ability to demonstrate self-control will improve, Ability to maintain clinical measurements within normal limits will improve, Compliance with prescribed medications will improve, and Ability to identify triggers associated with substance abuse/mental health issues will improve  I certify that inpatient services furnished can reasonably be expected to improve the patient's condition.    Donnice FORBES Right, PA-C 6/22/20253:33 PM

## 2023-10-09 NOTE — Progress Notes (Signed)
 [facility]/BMU LCSW Progress Note   10/09/2023    9:32 AM  Casey Fuentes   [id number]   Type of Contact and Topic:  Psychiatric Bed Placement   Pt accepted to [facility] BMU Room 305    Patient meets inpatient criteria per Bonnetta Mccoy, NP  The attending provider will be Dr. Layne  Call report to [phone]  Dena Kelch, RN @ [facility] ED notified.     Pt scheduled  to arrive at [facility] TODAY @ 1930.    Bunnie Gallop, MSW, LCSW-A  9:34 AM 10/09/2023

## 2023-10-09 NOTE — ED Provider Notes (Signed)
 Emergency Medicine Observation Re-evaluation Note  Casey Fuentes is a 28 y.o. female, seen on rounds today.  Pt initially presented to the ED for complaints of Suicidal Currently, the patient is not having any acute complaints.  Physical Exam  BP 108/67 (BP Location: Left Arm)   Pulse 73   Temp 98.5 F (36.9 C) (Oral)   Resp 20   Wt 99.8 kg   SpO2 98%   BMI 41.57 kg/m  Physical Exam General: Resting comfortably in stretcher Lungs: Normal work of breathing Psych: Calm  ED Course / MDM  EKG:   I have reviewed the labs performed to date as well as medications administered while in observation.  Recent changes in the last 24 hours include accepted to [facility] BM[facility]. Transport is on the way now.  Plan  Current plan is for transfer.      Yolande Lamar BROCKS, MD 10/09/23 1055

## 2023-10-10 LAB — LIPID PANEL
Cholesterol: 163 mg/dL (ref 0–200)
HDL: 42 mg/dL (ref >40)
LDL Cholesterol: 108 mg/dL — ABNORMAL HIGH (ref 0–99)
Total CHOL/HDL Ratio: 3.9 ratio
Triglycerides: 66 mg/dL (ref <150)
VLDL: 13 mg/dL (ref 0–40)

## 2023-10-10 LAB — TSH: TSH: 2.231 u[IU]/mL (ref 0.350–4.500)

## 2023-10-10 NOTE — BH IP Treatment Plan (Signed)
 Interdisciplinary Treatment and Diagnostic Plan Update  10/10/2023 Time of Session: 10:23AM Casey Fuentes MRN: [id number]  Principal Diagnosis: Bipolar 1 disorder, depressed, severe (HCC)  Secondary Diagnoses: Principal Problem:   Bipolar 1 disorder, depressed, severe (HCC)   Current Medications:  Current Facility-Administered Medications  Medication Dose Route Frequency Provider Last Rate Last Admin   acetaminophen  (TYLENOL ) tablet 650 mg  650 mg Oral Q6H PRN Mannie Jerel PARAS, NP   650 mg at 10/09/23 2344   alum & mag hydroxide-simeth (MAALOX/MYLANTA) 200-200-20 MG/5ML suspension 30 mL  30 mL Oral Q4H PRN Mannie Jerel PARAS, NP       cariprazine  (VRAYLAR ) capsule 1.5 mg  1.5 mg Oral Daily Mannie Jerel PARAS, NP   1.5 mg at 10/10/23 0942   haloperidol (HALDOL) tablet 5 mg  5 mg Oral TID PRN Mannie Jerel PARAS, NP       And   diphenhydrAMINE  (BENADRYL ) capsule 50 mg  50 mg Oral TID PRN Mannie Jerel PARAS, NP       haloperidol lactate (HALDOL) injection 5 mg  5 mg Intramuscular TID PRN Mannie Jerel PARAS, NP       And   diphenhydrAMINE  (BENADRYL ) injection 50 mg  50 mg Intramuscular TID PRN Mannie Jerel PARAS, NP       And   LORazepam (ATIVAN) injection 2 mg  2 mg Intramuscular TID PRN Mannie Jerel PARAS, NP       haloperidol lactate (HALDOL) injection 10 mg  10 mg Intramuscular TID PRN Mannie Jerel PARAS, NP       And   diphenhydrAMINE  (BENADRYL ) injection 50 mg  50 mg Intramuscular TID PRN Mannie Jerel PARAS, NP       And   LORazepam (ATIVAN) injection 2 mg  2 mg Intramuscular TID PRN Mannie Jerel PARAS, NP       magnesium  hydroxide (MILK OF MAGNESIA) suspension 30 mL  30 mL Oral Daily PRN Mannie Jerel PARAS, NP       nicotine  (NICODERM CQ  - dosed in mg/24 hours) patch 21 mg  21 mg Transdermal Q0600 Millington, Matthew E, PA-C   21 mg at 10/09/23 1608   traZODone  (DESYREL ) tablet 50 mg  50 mg Oral QHS PRN Mannie Jerel PARAS, NP   50 mg at 10/09/23 2134   PTA Medications: Medications Prior to Admission   Medication Sig Dispense Refill Last Dose/Taking   ARIPiprazole  (ABILIFY ) 10 MG tablet Take 1 tablet (10 mg total) by mouth daily. (Patient not taking: Reported on 10/07/2023) 30 tablet 0    escitalopram  (LEXAPRO ) 10 MG tablet Take 1 tablet (10 mg total) by mouth at bedtime. (Patient not taking: Reported on 10/07/2023) 30 tablet 0    lamoTRIgine (LAMICTAL) 25 MG tablet Take 25 mg by mouth daily. (Patient not taking: Reported on 10/07/2023)      nicotine  (NICODERM CQ  - DOSED IN MG/24 HOURS) 21 mg/24hr patch Place 1 patch (21 mg total) onto the skin daily as needed (smoking cessation). (Patient not taking: Reported on 10/07/2023) 28 patch 0     Patient Stressors:    Patient Strengths:    Treatment Modalities: Medication Management, Group therapy, Case management,  1 to 1 session with clinician, Psychoeducation, Recreational therapy.   Physician Treatment Plan for Primary Diagnosis: Bipolar 1 disorder, depressed, severe (HCC) Long Term Goal(s): Improvement in symptoms so as ready for discharge   Short Term Goals: Ability to verbalize feelings will improve Ability to disclose and discuss suicidal ideas Ability to demonstrate self-control will improve  Ability to maintain clinical measurements within normal limits will improve Compliance with prescribed medications will improve Ability to identify triggers associated with substance abuse/mental health issues will improve  Medication Management: Evaluate patient's response, side effects, and tolerance of medication regimen.  Therapeutic Interventions: 1 to 1 sessions, Unit Group sessions and Medication administration.  Evaluation of Outcomes: Not Met  Physician Treatment Plan for Secondary Diagnosis: Principal Problem:   Bipolar 1 disorder, depressed, severe (HCC)  Long Term Goal(s): Improvement in symptoms so as ready for discharge   Short Term Goals: Ability to verbalize feelings will improve Ability to disclose and discuss suicidal  ideas Ability to demonstrate self-control will improve Ability to maintain clinical measurements within normal limits will improve Compliance with prescribed medications will improve Ability to identify triggers associated with substance abuse/mental health issues will improve     Medication Management: Evaluate patient's response, side effects, and tolerance of medication regimen.  Therapeutic Interventions: 1 to 1 sessions, Unit Group sessions and Medication administration.  Evaluation of Outcomes: Not Met   RN Treatment Plan for Primary Diagnosis: Bipolar 1 disorder, depressed, severe (HCC) Long Term Goal(s): Knowledge of disease and therapeutic regimen to maintain health will improve  Short Term Goals: Ability to demonstrate self-control, Ability to participate in decision making will improve, Ability to verbalize feelings will improve, Ability to disclose and discuss suicidal ideas, Ability to identify and develop effective coping behaviors will improve, and Compliance with prescribed medications will improve  Medication Management: RN will administer medications as ordered by provider, will assess and evaluate patient's response and provide education to patient for prescribed medication. RN will report any adverse and/or side effects to prescribing provider.  Therapeutic Interventions: 1 on 1 counseling sessions, Psychoeducation, Medication administration, Evaluate responses to treatment, Monitor vital signs and CBGs as ordered, Perform/monitor CIWA, COWS, AIMS and Fall Risk screenings as ordered, Perform wound care treatments as ordered.  Evaluation of Outcomes: Not Met   LCSW Treatment Plan for Primary Diagnosis: Bipolar 1 disorder, depressed, severe (HCC) Long Term Goal(s): Safe transition to appropriate next level of care at discharge, Engage patient in therapeutic group addressing interpersonal concerns.  Short Term Goals: Engage patient in aftercare planning with referrals and  resources, Increase social support, Increase ability to appropriately verbalize feelings, Increase emotional regulation, Facilitate acceptance of mental health diagnosis and concerns, and Increase skills for wellness and recovery  Therapeutic Interventions: Assess for all discharge needs, 1 to 1 time with Social worker, Explore available resources and support systems, Assess for adequacy in community support network, Educate family and significant other(s) on suicide prevention, Complete Psychosocial Assessment, Interpersonal group therapy.  Evaluation of Outcomes: Not Met   Progress in Treatment: Attending groups: Yes. Participating in groups: Yes. Taking medication as prescribed: Yes. Toleration medication: Yes. Family/Significant other contact made: No, will contact:  once permission has been granted Patient understands diagnosis: Yes. Discussing patient identified problems/goals with staff: Yes. Medical problems stabilized or resolved: Yes. Denies suicidal/homicidal ideation: Yes. Issues/concerns per patient self-inventory: No. Other: none  New problem(s) identified: No, Describe:  none  New Short Term/Long Term Goal(s): detox, elimination of symptoms of psychosis, medication management for mood stabilization; elimination of SI thoughts; development of comprehensive mental wellness/sobriety plan.   Patient Goals:  figure out how to stay out of that funk  Discharge Plan or Barriers: CSW to assist patient in development of appropriate discharge.   Reason for Continuation of Hospitalization: Anxiety Depression Medication stabilization Suicidal ideation  Estimated Length of Stay:  1-7 days  Last 3 [place] Suicide Severity Risk Score: Flowsheet Row Admission (Current) from 10/09/2023 in [facility] ED from 10/07/2023 in [facility] at [facility] ED from 09/25/2023 in [facility] Emergency Department at [facility]  C-SSRS  RISK CATEGORY High Risk High Risk No Risk    Last PHQ 2/9 Scores:    06/29/2019    5:39 PM 06/06/2017   11:10 AM  Depression screen PHQ 2/9  Decreased Interest 2 0  Down, Depressed, Hopeless 3 0  PHQ - 2 Score 5 0  Altered sleeping 3   Tired, decreased energy 3   Change in appetite 2   Feeling bad or failure about yourself  3   Trouble concentrating 2   Moving slowly or fidgety/restless 0   Suicidal thoughts 3   PHQ-9 Score 21   Difficult doing work/chores Very difficult     Scribe for Treatment Team: Sherryle JINNY Paola KEN 10/10/2023 12:49 PM

## 2023-10-10 NOTE — Plan of Care (Signed)
   Problem: Activity: Goal: Interest or engagement in activities will improve Outcome: Progressing   Problem: Health Behavior/Discharge Planning: Goal: Compliance with treatment plan for underlying cause of condition will improve Outcome: Progressing   Problem: Safety: Goal: Periods of time without injury will increase Outcome: Progressing

## 2023-10-10 NOTE — Group Note (Signed)
 Date:  10/10/2023 Time:  7:37 AM  Group Topic/Focus:  Building Self Esteem:   The Focus of this group is helping patients become aware of the effects of self-esteem on their lives, the things they and others do that enhance or undermine their self-esteem, seeing the relationship between their level of self-esteem and the choices they make and learning ways to enhance self-esteem. Goals Group:   The focus of this group is to help patients establish daily goals to achieve during treatment and discuss how the patient can incorporate goal setting into their daily lives to aide in recovery.    Participation Level:  Active  Participation Quality:  Appropriate  Affect:  Appropriate  Cognitive:  Appropriate and Oriented  Insight: Appropriate  Engagement in Group:  Limited  Modes of Intervention:  Discussion and Support  Additional Comments:  N/A  Casey Fuentes 10/10/2023, 7:37 AM

## 2023-10-10 NOTE — BHH Counselor (Signed)
 Adult Comprehensive Assessment  Patient ID: Casey Fuentes, female   DOB: March 19, 1996, 28 y.o.   MRN: [id number]  Information Source: Information source: Patient  Current Stressors:  Patient states their primary concerns and needs for treatment are:: Pt reported that she had a very bad depressive episode. Patient states their goals for this hospitilization and ongoing recovery are:: Just figure out how to stay out of that funk. Educational / Learning stressors: None reported Employment / Job issues: Lack of employment. Family Relationships: Issues with getting along with her father. Financial / Lack of resources (include bankruptcy): Pt is not employed. Housing / Lack of housing: None reported Physical health (include injuries & life threatening diseases): None reported Social relationships: None reported Substance abuse: Pt reported uss of marijuana. Bereavement / Loss: None reported  Living/Environment/Situation:  Living Arrangements: Parent Living conditions (as described by patient or guardian): Good Who else lives in the home?: With mother How long has patient lived in current situation?: Like five months. What is atmosphere in current home: Comfortable, Loving, Supportive  Family History:  Marital status: Single Are you sexually active?: No What is your sexual orientation?: Heterosexual Does patient have children?: Yes How many children?: 1 How is patient's relationship with their children?: She reported having 50/50 custody of child. She stated that they have a good relationship.  Childhood History:  By whom was/is the patient raised?: Both parents Additional childhood history information: Pretty straight. She shared that her parents had relationship issues and didn't take time with her. Description of patient's relationship with caregiver when they were a child: I really didn't have a relationship with my parents growing up. Patient's description of current  relationship with people who raised him/her: Dad: Not good. Mom: Good How were you disciplined when you got in trouble as a child/adolescent?: Whipped. Does patient have siblings?: Yes Number of Siblings: 1 (Younger brother) Description of patient's current relationship with siblings: We don't really talk to eachother. Did patient suffer any verbal/emotional/physical/sexual abuse as a child?: No Did patient suffer from severe childhood neglect?: No Has patient ever been sexually abused/assaulted/raped as an adolescent or adult?: No Was the patient ever a victim of a crime or a disaster?: No Witnessed domestic violence?: No Has patient been affected by domestic violence as an adult?: No  Education:  Highest grade of school patient has completed: Tenth grade. Currently a student?: No Learning disability?: Yes What learning problems does patient have?: Pt reported that she was diagnosed with ADHD.  Employment/Work Situation:   Employment Situation: Unemployed Patient's Job has Been Impacted by Current Illness: Yes Describe how Patient's Job has Been Impacted: Mental health because I just like loose interest in anything important I guess. What is the Longest Time Patient has Held a Job?: Three years. 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 Has Patient ever Been in the [employer]?: No  Financial Resources:   Surveyor, quantity resources: [employer], Support from parents / caregiver, Private insurance Does patient have a representative payee or guardian?: No  Alcohol/Substance Abuse:   What has been your use of drugs/alcohol within the last 12 months?: She reported smoking three grams of marijuana, three to four times a week. If attempted suicide, did drugs/alcohol play a role in this?: Yes (She reported that she attempted to overdose with fentanyl  a couple of years ago.) Alcohol/Substance Abuse Treatment Hx: Denies past history Has alcohol/substance  abuse ever caused legal problems?: No  Social Support System:   [employer]  Support System: Poor Describe Community Support System: I have my mom, that's it. Type of faith/religion: Christian. How does patient's faith help to cope with current illness?: I pray.  Leisure/Recreation:   Do You Have Hobbies?: Yes Leisure and Hobbies: Draw, I do tattoos.  Strengths/Needs:   What is the patient's perception of their strengths?: I don't know. I'm pretty well organized when I apply myself to. Patient states these barriers may affect/interfere with their treatment: Pt denied any barriers. Patient states these barriers may affect their return to the community: Pt denied any barriers.  Discharge Plan:   Currently receiving community mental health services: No Patient states concerns and preferences for aftercare planning are: Pt is open to referral for mental health services. Patient states they will know when they are safe and ready for discharge when: I mean, I feel like I am now. Does patient have access to transportation?: Yes Does patient have financial barriers related to discharge medications?: No Will patient be returning to same living situation after discharge?: Yes  Summary/Recommendations:   Summary and Recommendations (to be completed by the evaluator): Patient is a 28 year old, single, female from [place] [place] [facility] She reported that she came into the hospital because she had a very bad depressive episode. Pt stated goal as "just to figure out how to stay out of that funk." She reported that she lives with her mother and plans to return there upon discharge. Stressors identified as not having a job and not getting along with her father. Pt denied any history of abuse. She reported smoking three grams of marijuana approximately three to four times a week. Pt endorsed a previous suicide attempt via fentanyl  a couple of years ago. She denied receiving any  mental health services currently but is open to referral upon discharge. Recommendations include crisis stabilization, therapeutic milieu, encourage group attendance and participation, medication management for mood stabilization and development of a comprehensive mental wellness plan.  Nadara JONELLE Fam. 10/10/2023

## 2023-10-10 NOTE — Group Note (Signed)
 Recreation Therapy Group Note   Group Topic:Health and Wellness  Group Date: 10/10/2023 Start Time: 1045 End Time: 1135 Facilitators: Celestia Jeoffrey BRAVO, LRT, CTRS Location: Courtyard  Group Description: [employer]. LRT and patients played games of basketball, drew with chalk, and played corn hole while outside in the courtyard while getting fresh air and sunlight. Music was being played in the background. LRT and peers conversed about different games they have played before, what they do in their free time and anything else that is on their minds. LRT encouraged pts to drink water  after being outside, sweating and getting their heart rate up.  Goal Area(s) Addressed: Patient will build on frustration tolerance skills. Patients will partake in a competitive play game with peers. Patients will gain knowledge of new leisure interest/hobby.    Affect/Mood: N/A   Participation Level: Did not attend    Clinical Observations/Individualized Feedback: Patient did not attend group.   Plan: Continue to engage patient in RT group sessions 2-3x/week.   Jeoffrey BRAVO Celestia, LRT, CTRS 10/10/2023 1:47 PM

## 2023-10-10 NOTE — Group Note (Signed)
 Date:  10/10/2023 Time:  9:56 PM  Group Topic/Focus:  Wrap-Up Group:   The focus of this group is to help patients review their daily goal of treatment and discuss progress on daily workbooks.    Participation Level:  Active  Participation Quality:  Appropriate, Attentive, Sharing, and Supportive  Affect:  Appropriate  Cognitive:  Appropriate  Insight: Appropriate and Good  Engagement in Group:  Engaged and Supportive  Modes of Intervention:  Discussion, Role-play, and Support  Additional Comments:     Kerri Katz 10/10/2023, 9:56 PM

## 2023-10-10 NOTE — Group Note (Signed)
 Date:  10/10/2023 Time:  9:56 AM  Group Topic/Focus:  Personal Choices and Values:   The focus of this group is to help patients assess and explore the importance of values in their lives, how their values affect their decisions, how they express their values and what opposes their expression.    Participation Level:  Active  Participation Quality:  Appropriate  Affect:  Appropriate  Cognitive:  Appropriate  Insight: Appropriate  Engagement in Group:  Engaged  Modes of Intervention:  Activity  Additional Comments:    Mayah Urquidi 10/10/2023, 9:56 AM

## 2023-10-10 NOTE — Progress Notes (Signed)
 [facility] MD Progress Note  10/10/2023 5:06 PM Casey Fuentes  MRN:  [id number]   Subjective:  Chart reviewed, case discussed in multidisciplinary meeting, patient seen during rounds.   Patient is a 28 year old Caucasian female with a psychiatric history of bipolar I disorder, postpartum depression, unipolar depression, anxiety, insomnia, and substance use disorders, who presented voluntarily due to worsening suicidal ideation over the past two months.  Patient reports current symptoms of depression at 2/10 and anxiety at 5/10. She denies current suicidal or homicidal ideation, auditory or visual hallucinations, though she reports auditory hallucinations two years ago. She admits to prior self-harm via substance use and cutting behavior, with a confirmed history of a manic episode described as involving impulsive behavior, insomnia, and elevated mood (quote "I get extremely happy" end quote). She reports active engagement in group therapy on the unit and provides her mother's contact information for support.  She was started on Vraylar  1.5 mg daily in the emergency department, and reports tolerating it well thus far. She previously trialed Abilify  and Lexapro , which were discontinued due to akathisia.  Mental Status Exam: Appearance well-groomed, behavior cooperative. Speech normal in rate and tone. Mood described as mildly anxious, affect mildly restricted. Thought process linear and goal-directed. Thought content without hallucinations, delusions, or suicidal/homicidal ideation. Insight fair. Judgment fair. Cognition grossly intact.  Denies medication side effects and there are none noted. Medication education provided to include risks, benefits, and side effects. Patient remains appropriate for the inpatient psychiatric setting for safety, medication management, and symptom stabilization.     Sleep: Good  Appetite:  Good  Past Psychiatric History: see h&P Family History:  Family History   Problem Relation Age of Onset   Diabetes Mother    Thyroid  disease Mother    Cirrhosis Mother    Healthy Father    Social History:  Social History   Substance and Sexual Activity  Alcohol Use Not Currently   Comment: None currently     Social History   Substance and Sexual Activity  Drug Use Yes   Types: Marijuana, Cocaine, Benzodiazepines, Fentanyl , MDMA (Ecstacy), Hydrocodone, Oxycodone    Comment: Xanax about every two or three days; Suboxone strips about every couple days or so, not often    Social History   Socioeconomic History   Marital status: Single    Spouse name: Not on file   Number of children: Not on file   Years of education: Not on file   Highest education level: Not on file  Occupational History   Not on file  Tobacco Use   Smoking status: Never   Smokeless tobacco: Current   Tobacco comments:    Vaping nicotine   Vaping Use   Vaping status: Every Day   Substances: Nicotine   Substance and Sexual Activity   Alcohol use: Not Currently    Comment: None currently   Drug use: Yes    Types: Marijuana, Cocaine, Benzodiazepines, Fentanyl , MDMA (Ecstacy), Hydrocodone, Oxycodone     Comment: Xanax about every two or three days; Suboxone strips about every couple days or so, not often   Sexual activity: Yes    Partners: Male    Birth control/protection: None, I.U.D.  Other Topics Concern   Not on file  Social History Narrative   Not on file   Social Drivers of Health   Financial Resource Strain: Not on file  Food Insecurity: No Food Insecurity (10/09/2023)   Hunger Vital Sign    Worried About Running Out of Food in the Last Year:  Never true    Ran Out of Food in the Last Year: Never true  Transportation Needs: No Transportation Needs (10/09/2023)   PRAPARE - Administrator, Civil Service (Medical): No    Lack of Transportation (Non-Medical): No  Physical Activity: Not on file  Stress: Not on file  Social Connections: Not on file    Past Medical History:  Past Medical History:  Diagnosis Date   Anxiety 10/22/2017   Chlamydia infection during pregnancy 02/03/2017   Deliberate self-cutting 10/22/2017   Age [age]   Dyspnea    Morbid obesity (HCC) 06/01/2017   Postpartum depression 10/22/2017    Past Surgical History:  Procedure Laterality Date   CESAREAN SECTION N/A 08/02/2017   Procedure: CESAREAN SECTION;  Surgeon: Connell Davies, MD;  Location: [facility] ORS;  Service: Obstetrics;  Laterality: N/A;   TONSILLECTOMY      Current Medications: Current Facility-Administered Medications  Medication Dose Route Frequency Provider Last Rate Last Admin   acetaminophen  (TYLENOL ) tablet 650 mg  650 mg Oral Q6H PRN Mannie Jerel PARAS, NP   650 mg at 10/09/23 2344   alum & mag hydroxide-simeth (MAALOX/MYLANTA) 200-200-20 MG/5ML suspension 30 mL  30 mL Oral Q4H PRN Mannie Jerel PARAS, NP       cariprazine  (VRAYLAR ) capsule 1.5 mg  1.5 mg Oral Daily Mannie Jerel PARAS, NP   1.5 mg at 10/10/23 0942   haloperidol (HALDOL) tablet 5 mg  5 mg Oral TID PRN Mannie Jerel PARAS, NP       And   diphenhydrAMINE  (BENADRYL ) capsule 50 mg  50 mg Oral TID PRN Mannie Jerel PARAS, NP       haloperidol lactate (HALDOL) injection 5 mg  5 mg Intramuscular TID PRN Mannie Jerel PARAS, NP       And   diphenhydrAMINE  (BENADRYL ) injection 50 mg  50 mg Intramuscular TID PRN Mannie Jerel PARAS, NP       And   LORazepam (ATIVAN) injection 2 mg  2 mg Intramuscular TID PRN Mannie Jerel PARAS, NP       haloperidol lactate (HALDOL) injection 10 mg  10 mg Intramuscular TID PRN Mannie Jerel PARAS, NP       And   diphenhydrAMINE  (BENADRYL ) injection 50 mg  50 mg Intramuscular TID PRN Mannie Jerel PARAS, NP       And   LORazepam (ATIVAN) injection 2 mg  2 mg Intramuscular TID PRN Mannie Jerel PARAS, NP       magnesium  hydroxide (MILK OF MAGNESIA) suspension 30 mL  30 mL Oral Daily PRN Mannie Jerel PARAS, NP       nicotine  (NICODERM CQ  - dosed in mg/24 hours) patch 21 mg  21 mg  Transdermal Q0600 Millington, Matthew E, PA-C   21 mg at 10/10/23 1637   traZODone  (DESYREL ) tablet 50 mg  50 mg Oral QHS PRN Mannie Jerel PARAS, NP   50 mg at 10/09/23 2134    Lab Results:  Results for orders placed or performed during the hospital encounter of 10/09/23 (from the past 48 hours)  Lipid panel     Status: Abnormal   Collection Time: 10/10/23  6:22 AM  Result Value Ref Range   Cholesterol 163 0 - 200 mg/dL   Triglycerides 66 <849 mg/dL   HDL 42 >59 mg/dL   Total CHOL/HDL Ratio 3.9 RATIO   VLDL 13 0 - 40 mg/dL   LDL Cholesterol 891 (H) 0 - 99 mg/dL    Comment:  Total Cholesterol/HDL:CHD Risk Coronary Heart Disease Risk Table                     Men   Women  1/2 Average Risk   3.4   3.3  Average Risk       5.0   4.4  2 X Average Risk   9.6   7.1  3 X Average Risk  23.4   11.0        Use the calculated Patient Ratio above and the CHD Risk Table to determine the patient's CHD Risk.        ATP III CLASSIFICATION (LDL):  <100     mg/dL   Optimal  899-870  mg/dL   Near or Above                    Optimal  130-159  mg/dL   Borderline  839-810  mg/dL   High  >809     mg/dL   Very High Performed at [facility], [address] [place], [place] [address]   TSH     Status: None   Collection Time: 10/10/23  6:22 AM  Result Value Ref Range   TSH 2.231 0.350 - 4.500 uIU/mL    Comment: Performed by a 3rd Generation assay with a functional sensitivity of <=0.01 uIU/mL. Performed at [facility], [address] [place], [place] [address]     Blood Alcohol level:  Lab Results  Component Value Date   [facility] <15 10/07/2023   ETH <10 11/28/2021    Metabolic Disorder Labs: Lab Results  Component Value Date   HGBA1C 4.3 (L) 12/01/2021   MPG 76.71 12/01/2021   No results found for: PROLACTIN Lab Results  Component Value Date   CHOL 163 10/10/2023   TRIG 66 10/10/2023   HDL 42 10/10/2023   CHOLHDL 3.9 10/10/2023   VLDL 13 10/10/2023    LDLCALC 108 (H) 10/10/2023   LDLCALC 83 12/01/2021      Psychiatric Specialty Exam:  Presentation  General Appearance:  Appropriate for Environment  Eye Contact: Fair  Speech: Clear and Coherent  Speech Volume: Normal    Mood and Affect  Mood: Depressed  Affect: Flat   Thought Process  Thought Processes: Coherent; Linear  Descriptions of Associations:Intact  Orientation:Full (Time, Place and Person)  Thought Content:Logical  Hallucinations:Hallucinations: None  Ideas of Reference:None  Suicidal Thoughts:Suicidal Thoughts: Yes, Active SI Active Intent and/or Plan: With Plan  Homicidal Thoughts:Homicidal Thoughts: No   Sensorium  Memory: Immediate Good  Judgment: Poor  Insight: Poor   Executive Functions  Concentration: Fair  Attention Span: Fair  Recall: Fair  Fund of Knowledge: Fair  Language: Fair   Psychomotor Activity  Psychomotor Activity: Psychomotor Activity: Normal  Musculoskeletal: Strength & Muscle Tone: within normal limits Gait & Station: normal Assets  Assets: Manufacturing systems engineer; Desire for Improvement    Physical Exam: Physical Exam ROS Blood pressure (!) 143/92, pulse 93, temperature 98 F (36.7 C), resp. rate 16, height 5' 1 (1.549 m), weight 99.3 kg, SpO2 98%. Body mass index is 41.38 kg/m.  Diagnosis: Principal Problem:   Bipolar 1 disorder, depressed, severe (HCC)   PLAN: Safety and Monitoring:  -- Voluntary admission to inpatient psychiatric unit for safety, stabilization and treatment  -- Daily contact with patient to assess and evaluate symptoms and progress in treatment  -- Patient's case to be discussed in multi-disciplinary team meeting  -- Observation Level : q15 minute  checks  -- Vital signs:  q12 hours  -- Precautions: suicide, elopement, and assault -- Encouraged patient to participate in unit milieu and in scheduled group therapies  2. Psychiatric Diagnoses and Treatment:   Working Diagnoses and Plan Bipolar I Disorder, currently depressed: Continue Vraylar  1.5 mg daily with slow titration given history of akathisia. If depressive symptoms remain inadequately controlled, consider adding Lithium or Lamotrigine (Lamictal) as adjunctive treatment.  Polysubstance Use Disorder: Encourage continued abstinence. As patient stabilizes, initiate discussion about substance use treatment programs.  Anxiety: Encourage use of group therapy and coping strategies to manage primary stressors contributing to anxiety.  Nicotine  Dependence: Continue Nicoderm CQ  21 mg/24hr transdermal patch for smoking cessation support.       3. Medical Issues Being Addressed: NA    4. Discharge Planning:   -- Social work and case management to assist with discharge planning and identification of hospital follow-up needs prior to discharge  -- Estimated LOS: 3-4 days  Hoy CHRISTELLA Pinal, NP 10/10/2023, 5:06 PM

## 2023-10-10 NOTE — Progress Notes (Signed)
 Patient presents with flat affect and questioned her vraylar  this morning stating she did not get good sleep last night. Reviewed medication with patient and patient took her vraylar  this morning with no issues. Patient denied SI,HI, and A/V/H with no plan or intent and states her depression is a 0 out of 10 but anxiety is a 9 out of 10. Patient states her goal for today is to lower stress and calm down. Patient observed out in dayroom socializing appropriately with peers. Patient remains cooperative in unit.

## 2023-10-10 NOTE — Group Note (Signed)
 [facility] LCSW Group Therapy Note    Group Date: 10/10/2023 Start Time: 1300 End Time: 1400  Type of Therapy and Topic:  Group Therapy:  Overcoming Obstacles  Participation Level:  [facility] PARTICIPATION LEVEL: Minimal   Description of Group:   In this group patients will be encouraged to explore what they see as obstacles to their own wellness and recovery. They will be guided to discuss their thoughts, feelings, and behaviors related to these obstacles. The group will process together ways to cope with barriers, with attention given to specific choices patients can make. Each patient will be challenged to identify changes they are motivated to make in order to overcome their obstacles. This group will be process-oriented, with patients participating in exploration of their own experiences as well as giving and receiving support and challenge from other group members.  Therapeutic Goals: 1. Patient will identify personal and current obstacles as they relate to admission. 2. Patient will identify barriers that currently interfere with their wellness or overcoming obstacles.  3. Patient will identify feelings, thought process and behaviors related to these barriers. 4. Patient will identify two changes they are willing to make to overcome these obstacles:    Summary of Patient Progress Patient was present for the entirety of the group process. She participated in the icebreaker but not in the larger group discussion. Insight remains questionable.    Therapeutic Modalities:   Cognitive Behavioral Therapy Solution Focused Therapy Motivational Interviewing Relapse Prevention Therapy   Nadara JONELLE Fam, LCSW

## 2023-10-11 LAB — HEMOGLOBIN A1C
Hgb A1c MFr Bld: 4.4 % — ABNORMAL LOW (ref 4.8–5.6)
Mean Plasma Glucose: 79.58 mg/dL

## 2023-10-11 NOTE — Progress Notes (Signed)
   10/11/23 0855  Psych Admission Type (Psych Patients Only)  Admission Status Involuntary  Psychosocial Assessment  Patient Complaints None  Eye Contact Fair  Facial Expression Flat  Affect Flat  Speech Logical/coherent;Soft  Interaction Assertive  Motor Activity Slow  Appearance/Hygiene Unremarkable  Behavior Characteristics Cooperative;Appropriate to situation  Mood Pleasant (patient states overall, she is feeling pretty good.)  Aggressive Behavior  Effect No apparent injury  Thought Process  Coherency WDL  Content WDL  Delusions None reported or observed  Perception WDL  Hallucination None reported or observed  Judgment WDL  Confusion None  Danger to Self  Current suicidal ideation? Denies  Self-Injurious Behavior No self-injurious ideation or behavior indicators observed or expressed   Agreement Not to Harm Self Yes  Description of Agreement Verbal  Danger to Others  Danger to Others None reported or observed   Patient's goal for today, per her self-inventory is to get out and stay focused on my priorities and goals, be a better parent, in which she will think and come up with a plan, in order to achieve her goal.

## 2023-10-11 NOTE — Group Note (Signed)
 Date:  10/11/2023 Time:  9:07 PM  Group Topic/Focus:  Stages of Change:   The focus of this group is to explain the stages of change and help patients identify changes they want to make upon discharge. Wrap-Up Group:   The focus of this group is to help patients review their daily goal of treatment and discuss progress on daily workbooks.    Participation Level:  Minimal  Participation Quality:  Appropriate and Attentive  Affect:  Appropriate  Cognitive:  Alert  Insight: Good  Engagement in Group:  Limited  Modes of Intervention:  Activity, Discussion, and Socialization  Additional Comments:     Maglione,Fernandez Kenley E 10/11/2023, 9:07 PM

## 2023-10-11 NOTE — Progress Notes (Signed)
 [facility] MD Progress Note  10/11/2023 1:29 PM Casey Fuentes  MRN:  [id number]   Subjective:  Chart reviewed, case discussed in multidisciplinary meeting, patient seen during rounds.   Patient seen today for follow-up psychiatric evaluation. Upon approach, patient is noted to be calm and engaged while sitting in the courtyard, presentation cooperative and future-oriented.  Patient reports her mood is quote "good, feel like medication is working" end quote. She reports good sleep and minimal anxiety today. She continues to tolerate Vraylar  without side effects and demonstrates improved insight. She states her plan is to discharge to her mother's home, who she reports is supportive. She recalls having spoken with her mother earlier this week and expresses a desire to engage in therapy following discharge.  In discussion of substance use history, patient acknowledges prior overdose attempts involving fentanyl , stating quote "I was Narcan'd five times" end quote, and reflecting that at the time, quote "I didn't care whether I lived or died" end quote. She reports she has not used fentanyl  in over a year, though identifies it as a means she has previously considered in the context of suicidal ideation. She currently denies active suicidal or homicidal ideation and is reality-based during the interview.  Mental Status Exam: Appearance appropriate, behavior calm and cooperative. Speech normal in rate and tone. Mood calm, slightly depressed. Affect congruent. Thought process linear and goal-directed. Thought content reality-based without delusions, paranoia, or hallucinations. Denies SI/HI at this time. Insight improving. Judgment fair. Cognition intact.  Denies medication side effects and there are none noted. Medication education provided to include risks, benefits, and side effects. Patient remains appropriate for the inpatient psychiatric setting for safety, medication management, and symptom  stabilization.    Sleep: Good  Appetite:  Good  Past Psychiatric History: see h&P Family History:  Family History  Problem Relation Age of Onset   Diabetes Mother    Thyroid  disease Mother    Cirrhosis Mother    Healthy Father    Social History:  Social History   Substance and Sexual Activity  Alcohol Use Not Currently   Comment: None currently     Social History   Substance and Sexual Activity  Drug Use Yes   Types: Marijuana, Cocaine, Benzodiazepines, Fentanyl , MDMA (Ecstacy), Hydrocodone, Oxycodone    Comment: Xanax about every two or three days; Suboxone strips about every couple days or so, not often    Social History   Socioeconomic History   Marital status: Single    Spouse name: Not on file   Number of children: Not on file   Years of education: Not on file   Highest education level: Not on file  Occupational History   Not on file  Tobacco Use   Smoking status: Never   Smokeless tobacco: Current   Tobacco comments:    Vaping nicotine   Vaping Use   Vaping status: Every Day   Substances: Nicotine   Substance and Sexual Activity   Alcohol use: Not Currently    Comment: None currently   Drug use: Yes    Types: Marijuana, Cocaine, Benzodiazepines, Fentanyl , MDMA (Ecstacy), Hydrocodone, Oxycodone     Comment: Xanax about every two or three days; Suboxone strips about every couple days or so, not often   Sexual activity: Yes    Partners: Male    Birth control/protection: None, I.U.D.  Other Topics Concern   Not on file  Social History Narrative   Not on file   Social Drivers of Health   Financial Resource Strain: Not  on file  Food Insecurity: No Food Insecurity (10/09/2023)   Hunger Vital Sign    Worried About Running Out of Food in the Last Year: Never true    Ran Out of Food in the Last Year: Never true  Transportation Needs: No Transportation Needs (10/09/2023)   PRAPARE - Administrator, Civil Service (Medical): No    Lack of  Transportation (Non-Medical): No  Physical Activity: Not on file  Stress: Not on file  Social Connections: Not on file   Past Medical History:  Past Medical History:  Diagnosis Date   Anxiety 10/22/2017   Chlamydia infection during pregnancy 02/03/2017   Deliberate self-cutting 10/22/2017   Age [age]   Dyspnea    Morbid obesity (HCC) 06/01/2017   Postpartum depression 10/22/2017    Past Surgical History:  Procedure Laterality Date   CESAREAN SECTION N/A 08/02/2017   Procedure: CESAREAN SECTION;  Surgeon: Connell Davies, MD;  Location: [facility] ORS;  Service: Obstetrics;  Laterality: N/A;   TONSILLECTOMY      Current Medications: Current Facility-Administered Medications  Medication Dose Route Frequency Provider Last Rate Last Admin   acetaminophen  (TYLENOL ) tablet 650 mg  650 mg Oral Q6H PRN Mannie Jerel PARAS, NP   650 mg at 10/11/23 0850   alum & mag hydroxide-simeth (MAALOX/MYLANTA) 200-200-20 MG/5ML suspension 30 mL  30 mL Oral Q4H PRN Mannie Jerel PARAS, NP       cariprazine  (VRAYLAR ) capsule 1.5 mg  1.5 mg Oral Daily Mannie Jerel PARAS, NP   1.5 mg at 10/11/23 0851   haloperidol (HALDOL) tablet 5 mg  5 mg Oral TID PRN Mannie Jerel PARAS, NP       And   diphenhydrAMINE  (BENADRYL ) capsule 50 mg  50 mg Oral TID PRN Mannie Jerel PARAS, NP   50 mg at 10/10/23 2107   haloperidol lactate (HALDOL) injection 5 mg  5 mg Intramuscular TID PRN Mannie Jerel PARAS, NP       And   diphenhydrAMINE  (BENADRYL ) injection 50 mg  50 mg Intramuscular TID PRN Mannie Jerel PARAS, NP       And   LORazepam (ATIVAN) injection 2 mg  2 mg Intramuscular TID PRN Mannie Jerel PARAS, NP       haloperidol lactate (HALDOL) injection 10 mg  10 mg Intramuscular TID PRN Mannie Jerel PARAS, NP       And   diphenhydrAMINE  (BENADRYL ) injection 50 mg  50 mg Intramuscular TID PRN Mannie Jerel PARAS, NP       And   LORazepam (ATIVAN) injection 2 mg  2 mg Intramuscular TID PRN Mannie Jerel PARAS, NP       magnesium  hydroxide (MILK OF  MAGNESIA) suspension 30 mL  30 mL Oral Daily PRN Mannie Jerel PARAS, NP       nicotine  (NICODERM CQ  - dosed in mg/24 hours) patch 21 mg  21 mg Transdermal Q0600 Millington, Matthew E, PA-C   21 mg at 10/10/23 1637   traZODone  (DESYREL ) tablet 50 mg  50 mg Oral QHS PRN Mannie Jerel PARAS, NP   50 mg at 10/10/23 2107    Lab Results:  Results for orders placed or performed during the hospital encounter of 10/09/23 (from the past 48 hours)  Lipid panel     Status: Abnormal   Collection Time: 10/10/23  6:22 AM  Result Value Ref Range   Cholesterol 163 0 - 200 mg/dL   Triglycerides 66 <849 mg/dL   HDL 42 >59 mg/dL  Total CHOL/HDL Ratio 3.9 RATIO   VLDL 13 0 - 40 mg/dL   LDL Cholesterol 891 (H) 0 - 99 mg/dL    Comment:        Total Cholesterol/HDL:CHD Risk Coronary Heart Disease Risk Table                     Men   Women  1/2 Average Risk   3.4   3.3  Average Risk       5.0   4.4  2 X Average Risk   9.6   7.1  3 X Average Risk  23.4   11.0        Use the calculated Patient Ratio above and the CHD Risk Table to determine the patient's CHD Risk.        ATP III CLASSIFICATION (LDL):  <100     mg/dL   Optimal  899-870  mg/dL   Near or Above                    Optimal  130-159  mg/dL   Borderline  839-810  mg/dL   High  >809     mg/dL   Very High Performed at [facility], [address] [place], [place] [address]   Hemoglobin A1c     Status: Abnormal   Collection Time: 10/10/23  6:22 AM  Result Value Ref Range   Hgb A1c MFr Bld 4.4 (L) 4.8 - 5.6 %    Comment: (NOTE) Diagnosis of Diabetes The following HbA1c ranges recommended by the American Diabetes Association (ADA) may be used as an aid in the diagnosis of diabetes mellitus.  Hemoglobin             Suggested A1C NGSP%              Diagnosis  <5.7                   Non Diabetic  5.7-6.4                Pre-Diabetic  >6.4                   Diabetic  <7.0                   Glycemic control for                        adults with diabetes.     Mean Plasma Glucose 79.58 mg/dL    Comment: Performed at [facility], [address], [place], [place] [address]  TSH     Status: None   Collection Time: 10/10/23  6:22 AM  Result Value Ref Range   TSH 2.231 0.350 - 4.500 uIU/mL    Comment: Performed by a 3rd Generation assay with a functional sensitivity of <=0.01 uIU/mL. Performed at [facility], [address] [place], [place] [address]     Blood Alcohol level:  Lab Results  Component Value Date   [facility] <15 10/07/2023   ETH <10 11/28/2021    Metabolic Disorder Labs: Lab Results  Component Value Date   HGBA1C 4.4 (L) 10/10/2023   MPG 79.58 10/10/2023   MPG 76.71 12/01/2021   No results found for: PROLACTIN Lab Results  Component Value Date   CHOL 163 10/10/2023   TRIG 66 10/10/2023   HDL 42 10/10/2023   CHOLHDL 3.9 10/10/2023   VLDL  13 10/10/2023   LDLCALC 108 (H) 10/10/2023   LDLCALC 83 12/01/2021      Psychiatric Specialty Exam:  Presentation  General Appearance:  Appropriate for Environment  Eye Contact: Fair  Speech: Clear and Coherent  Speech Volume: Normal    Mood and Affect  Mood: Depressed  Affect: Flat   Thought Process  Thought Processes: Coherent; Linear  Descriptions of Associations:Intact  Orientation:Full (Time, Place and Person)  Thought Content:Logical  Hallucinations:No data recorded  Ideas of Reference:None  Suicidal Thoughts:No data recorded  Homicidal Thoughts:No data recorded   Sensorium  Memory: Immediate Good  Judgment: Poor  Insight: Poor   Executive Functions  Concentration: Fair  Attention Span: Fair  Recall: [employer] of Knowledge: Fair  Language: Fair   Psychomotor Activity  Psychomotor Activity: No data recorded  Musculoskeletal: Strength & Muscle Tone: within normal limits Gait & Station: normal Assets  Assets: Manufacturing systems engineer; Desire for  Improvement    Physical Exam: Physical Exam ROS Blood pressure 107/74, pulse 72, temperature 97.9 F (36.6 C), resp. rate 20, height 5' 1 (1.549 m), weight 99.3 kg, SpO2 100%. Body mass index is 41.38 kg/m.  Diagnosis: Principal Problem:   Bipolar 1 disorder, depressed, severe (HCC)   PLAN: Safety and Monitoring:  -- Voluntary admission to inpatient psychiatric unit for safety, stabilization and treatment  -- Daily contact with patient to assess and evaluate symptoms and progress in treatment  -- Patient's case to be discussed in multi-disciplinary team meeting  -- Observation Level : q15 minute checks  -- Vital signs:  q12 hours  -- Precautions: suicide, elopement, and assault -- Encouraged patient to participate in unit milieu and in scheduled group therapies  2. Psychiatric Diagnoses and Treatment:  Working Diagnoses and Plan Bipolar I Disorder, currently depressed: Continue Vraylar  1.5 mg daily with slow titration given history of akathisia. If depressive symptoms remain inadequately controlled, consider adding Lithium or Lamotrigine (Lamictal) as adjunctive treatment.  Polysubstance Use Disorder: Encourage continued abstinence. As patient stabilizes, initiate discussion about substance use treatment programs.  Anxiety: Encourage use of group therapy and coping strategies to manage primary stressors contributing to anxiety.  Nicotine  Dependence: Continue Nicoderm CQ  21 mg/24hr transdermal patch for smoking cessation support.  Plan: Continue Vraylar  1.5 mg daily, tolerated well.  Continue to support therapeutic engagement.  Encourage open discussion about relapse risks and emotional safety related to substance use history.  Plan to call mother for discharge coordination and follow-up planning.  Assess ongoing readiness for discharge based on continued symptom stabilization and insight.        3. Medical Issues Being Addressed: NA    4. Discharge Planning:    -- Social work and case management to assist with discharge planning and identification of hospital follow-up needs prior to discharge  -- Estimated LOS: 3-4 days  Hoy CHRISTELLA Pinal, NP 10/11/2023, 1:29 PM

## 2023-10-11 NOTE — Group Note (Signed)
 LCSW Group Therapy Note  Group Date: 10/11/2023 Start Time: 1300 End Time: 1400   Type of Therapy and Topic:  Group Therapy: Anger Cues and Responses  Participation Level:  None   Description of Group:   In this group, patients learned how to recognize the physical, cognitive, emotional, and behavioral responses they have to anger-provoking situations.  They identified a recent time they became angry and how they reacted.  They analyzed how their reaction was possibly beneficial and how it was possibly unhelpful.  The group discussed a variety of healthier coping skills that could help with such a situation in the future.  Focus was placed on how helpful it is to recognize the underlying emotions to our anger, because working on those can lead to a more permanent solution as well as our ability to focus on the important rather than the urgent.  Therapeutic Goals: Patients will remember their last incident of anger and how they felt emotionally and physically, what their thoughts were at the time, and how they behaved. Patients will identify how their behavior at that time worked for them, as well as how it worked against them. Patients will explore possible new behaviors to use in future anger situations. Patients will learn that anger itself is normal and cannot be eliminated, and that healthier reactions can assist with resolving conflict rather than worsening situations.  Summary of Patient Progress:   Patient was present in group.  However, did not engage in group discussions.  She appeared to follow along with the discussion.     Therapeutic Modalities:   Cognitive Behavioral Therapy    Sherryle JINNY Margo, LCSW 10/11/2023  4:07 PM

## 2023-10-11 NOTE — Group Note (Signed)
 Date:  10/11/2023 Time:  2:54 PM  Group Topic/Focus:  Goals Group:   The focus of this group is to help patients establish daily goals to achieve during treatment and discuss how the patient can incorporate goal setting into their daily lives to aide in recovery.    Participation Level:  Active  Participation Quality:  Appropriate  Affect:  Appropriate  Cognitive:  Appropriate  Insight: Appropriate  Engagement in Group:  Engaged  Modes of Intervention:  Activity  Additional Comments:  Product/process development scientist. Patients were given many different magazines, a glue stick, markers, and a piece of cardstock paper. LRT and pts discussed the importance of having goals in life. LRT and pts discussed the difference between short-term and long-term goals, as well as what a SMART goal is. LRT encouraged pts to create a vision board, with images they picked and then cut out with safety scissors from the magazine, for themselves, that capture their short and long-term goals. LRT encouraged pts to show and explain their vision board to the group.   Nicolle Heward 10/11/2023, 2:54 PM

## 2023-10-11 NOTE — Plan of Care (Signed)
  Problem: Education: Goal: Knowledge of [facility] Education information/materials will improve Outcome: Progressing Goal: Emotional status will improve Outcome: Progressing Goal: Mental status will improve Outcome: Progressing Goal: Verbalization of understanding the information provided will improve Outcome: Progressing   Problem: Activity: Goal: Interest or engagement in activities will improve Outcome: Progressing Goal: Sleeping patterns will improve Outcome: Progressing   Problem: Coping: Goal: Ability to verbalize frustrations and anger appropriately will improve Outcome: Progressing Goal: Ability to demonstrate self-control will improve Outcome: Progressing   Problem: Health Behavior/Discharge Planning: Goal: Identification of resources available to assist in meeting health care needs will improve Outcome: Progressing Goal: Compliance with treatment plan for underlying cause of condition will improve Outcome: Progressing   Problem: Physical Regulation: Goal: Ability to maintain clinical measurements within normal limits will improve Outcome: Progressing   Problem: Safety: Goal: Periods of time without injury will increase Outcome: Progressing   Problem: Education: Goal: Ability to state activities that reduce stress will improve Outcome: Progressing   Problem: Coping: Goal: Ability to identify and develop effective coping behavior will improve Outcome: Progressing   Problem: Self-Concept: Goal: Ability to identify factors that promote anxiety will improve Outcome: Progressing Goal: Level of anxiety will decrease Outcome: Progressing Goal: Ability to modify response to factors that promote anxiety will improve Outcome: Progressing   Problem: Education: Goal: Utilization of techniques to improve thought processes will improve Outcome: Progressing Goal: Knowledge of the prescribed therapeutic regimen will improve Outcome: Progressing   Problem:  Activity: Goal: Interest or engagement in leisure activities will improve Outcome: Progressing Goal: Imbalance in normal sleep/wake cycle will improve Outcome: Progressing   Problem: Coping: Goal: Coping ability will improve Outcome: Progressing Goal: Will verbalize feelings Outcome: Progressing   Problem: Health Behavior/Discharge Planning: Goal: Ability to make decisions will improve Outcome: Progressing Goal: Compliance with therapeutic regimen will improve Outcome: Progressing   Problem: Role Relationship: Goal: Will demonstrate positive changes in social behaviors and relationships Outcome: Progressing   Problem: Safety: Goal: Ability to disclose and discuss suicidal ideas will improve Outcome: Progressing Goal: Ability to identify and utilize support systems that promote safety will improve Outcome: Progressing   Problem: Self-Concept: Goal: Will verbalize positive feelings about self Outcome: Progressing Goal: Level of anxiety will decrease Outcome: Progressing   Problem: Education: Goal: Ability to make informed decisions regarding treatment will improve Outcome: Progressing   Problem: Coping: Goal: Coping ability will improve Outcome: Progressing   Problem: Health Behavior/Discharge Planning: Goal: Identification of resources available to assist in meeting health care needs will improve Outcome: Progressing   Problem: Medication: Goal: Compliance with prescribed medication regimen will improve Outcome: Progressing   Problem: Self-Concept: Goal: Ability to disclose and discuss suicidal ideas will improve Outcome: Progressing Goal: Will verbalize positive feelings about self Outcome: Progressing Note: Patient is on track. Patient will work on increased adherence

## 2023-10-11 NOTE — Plan of Care (Signed)
   Problem: Education: Goal: Emotional status will improve Outcome: Progressing Goal: Mental status will improve Outcome: Progressing

## 2023-10-11 NOTE — [facility] Suicide Risk Assessment (Signed)
 [facility] INPATIENT:  Family/Significant Other Suicide Prevention Education  Suicide Prevention Education:  Education Completed; Dorothe Rampey/mother [phone]), has been identified by the patient as the family member/significant other with whom the patient will be residing, and identified as the person(s) who will aid the patient in the event of a mental health crisis (suicidal ideations/suicide attempt).  With written consent from the patient, the family member/significant other has been provided the following suicide prevention education, prior to the and/or following the discharge of the patient.  The suicide prevention education provided includes the following: Suicide risk factors Suicide prevention and interventions National Suicide Hotline telephone number [facility] assessment telephone number [facility] Emergency Assistance 911 [facility] and/or Residential Mobile Crisis Unit telephone number  Request made of family/significant other to: Remove weapons (e.g., guns, rifles, knives), all items previously/currently identified as safety concern.   Remove drugs/medications (over-the-counter, prescriptions, illicit drugs), all items previously/currently identified as a safety concern.  The family member/significant other verbalizes understanding of the suicide prevention education information provided.  The family member/significant other agrees to remove the items of safety concern listed above.  Mother shared that pt has been really depressed and reported increased desire to sleep with lack of motivation/drive to do anything. Estorga stated that pt has said that she doesn't want to be here anymore. She expressed feeling that pt is potentially a danger to herself when she is doing this badly. Mother denied pt having any access to weapons. She inquired regarding pt medications and was informed that CSW would have to speak with provider regarding this question. No other  concerns expressed. Contact ended without incident.   Nadara JONELLE Fam 10/11/2023, 1:19 PM

## 2023-10-11 NOTE — Progress Notes (Signed)
 Pt calm and pleasant during assessment denying SI/HI/AVH. Pt observed by this Clinical research associate interacting appropriately with staff and peers on the unit. Pt compliant with medication administration per MD orders. Pt given education, support, and encouragement to be active in her treatment plan. Pt being monitored Q 15 minutes for safety per unit protocol, remains safe on the unit

## 2023-10-11 NOTE — Group Note (Signed)
 Date:  10/11/2023 Time:  2:28 PM  Group Topic/Focus:  Healthy Communication:   The focus of this group is to discuss communication, barriers to communication, as well as healthy ways to communicate with others.    Participation Level:  Active  Participation Quality:  Appropriate  Affect:  Appropriate  Cognitive:  Appropriate  Insight: Appropriate  Engagement in Group:  Engaged  Modes of Intervention:  Activity  Additional Comments:    Alaynna Kerwood 10/11/2023, 2:28 PM

## 2023-10-11 NOTE — Group Note (Signed)
 Recreation Therapy Group Note   Group Topic:Coping Skills  Group Date: 10/11/2023 Start Time: 1005 End Time: 1045 Facilitators: Celestia Jeoffrey BRAVO, LRT, CTRS Location: Courtyard  Group Description: [employer]. LRT and patients played games of basketball, drew with chalk, and played corn hole while outside in the courtyard while getting fresh air and sunlight. Music was being played in the background. LRT and peers conversed about different games they have played before, what they do in their free time and anything else that is on their minds. LRT encouraged pts to drink water  after being outside, sweating and getting their heart rate up.  Goal Area(s) Addressed: Patient will build on frustration tolerance skills. Patients will partake in a competitive play game with peers. Patients will gain knowledge of new leisure interest/hobby.    Affect/Mood: Appropriate   Participation Level: Active and Engaged   Participation Quality: Independent   Behavior: Calm and Cooperative   Speech/Thought Process: Coherent   Insight: Fair   Judgement: Fair    Modes of Intervention: Open Conversation, Rapport Building, and Socialization   Patient Response to Interventions:  Attentive, Engaged, and Receptive   Education Outcome:  Acknowledges education   Clinical Observations/Individualized Feedback: Kirstine was active in their participation of session activities and group discussion. Pt interacted well with LRT and peers duration of session.    Plan: Continue to engage patient in RT group sessions 2-3x/week.   [address], LRT, CTRS 10/11/2023 1:24 PM

## 2023-10-11 NOTE — Progress Notes (Signed)
   10/10/23 2300  Psych Admission Type (Psych Patients Only)  Admission Status Involuntary  Psychosocial Assessment  Patient Complaints Depression;Anxiety  Eye Contact Fair  Facial Expression Flat  Speech Logical/coherent  Interaction Assertive  Motor Activity Slow  Appearance/Hygiene Unremarkable  Behavior Characteristics Cooperative  Mood Anxious  Thought Process  Coherency WDL  Content WDL  Delusions None reported or observed  Perception WDL  Hallucination None reported or observed  Judgment Poor  Confusion None  Danger to Self  Current suicidal ideation? Denies  Self-Injurious Behavior No self-injurious ideation or behavior indicators observed or expressed   Agreement Not to Harm Self Yes  Description of Agreement Verbal  Danger to Others  Danger to Others None reported or observed

## 2023-10-12 MED ORDER — NICOTINE 21 MG/24HR TD PT24
21.0000 mg | MEDICATED_PATCH | Freq: Every day | TRANSDERMAL | Status: DC
  Administered 2023-10-13 – 2023-10-14 (×2): 21 mg via TRANSDERMAL
  Filled 2023-10-12 (×2): qty 1

## 2023-10-12 MED ORDER — CARIPRAZINE HCL 1.5 MG PO CAPS
3.0000 mg | ORAL_CAPSULE | Freq: Every day | ORAL | Status: DC
  Administered 2023-10-13 – 2023-10-14 (×2): 3 mg via ORAL
  Filled 2023-10-12 (×2): qty 2

## 2023-10-12 MED ORDER — MELATONIN 5 MG PO TABS
2.5000 mg | ORAL_TABLET | Freq: Every day | ORAL | Status: DC
  Administered 2023-10-12 – 2023-10-13 (×2): 2.5 mg via ORAL
  Filled 2023-10-12 (×2): qty 1

## 2023-10-12 MED ORDER — NICOTINE POLACRILEX 2 MG MT GUM
2.0000 mg | CHEWING_GUM | OROMUCOSAL | Status: DC | PRN
  Administered 2023-10-12: 2 mg via ORAL
  Filled 2023-10-12: qty 1

## 2023-10-12 NOTE — Progress Notes (Signed)
 Pt calm and pleasant during assessment denying SI/HI/AVH. Pt observed by this Clinical research associate interacting appropriately with staff and peers on the unit. Pt compliant with medication administration per MD orders. Pt given education, support, and encouragement to be active in her treatment plan. Pt being monitored Q 15 minutes for safety per unit protocol, remains safe on the unit

## 2023-10-12 NOTE — Plan of Care (Signed)
  Problem: Education: Goal: Knowledge of [facility] Education information/materials will improve Outcome: Progressing Goal: Emotional status will improve Outcome: Progressing Goal: Mental status will improve Outcome: Progressing Goal: Verbalization of understanding the information provided will improve Outcome: Progressing   Problem: Activity: Goal: Interest or engagement in activities will improve Outcome: Progressing Goal: Sleeping patterns will improve Outcome: Progressing   Problem: Coping: Goal: Ability to verbalize frustrations and anger appropriately will improve Outcome: Progressing Goal: Ability to demonstrate self-control will improve Outcome: Progressing   Problem: Health Behavior/Discharge Planning: Goal: Identification of resources available to assist in meeting health care needs will improve Outcome: Progressing Goal: Compliance with treatment plan for underlying cause of condition will improve Outcome: Progressing   Problem: Physical Regulation: Goal: Ability to maintain clinical measurements within normal limits will improve Outcome: Progressing   Problem: Safety: Goal: Periods of time without injury will increase Outcome: Progressing   Problem: Education: Goal: Ability to state activities that reduce stress will improve Outcome: Progressing   Problem: Coping: Goal: Ability to identify and develop effective coping behavior will improve Outcome: Progressing   Problem: Self-Concept: Goal: Ability to identify factors that promote anxiety will improve Outcome: Progressing Goal: Level of anxiety will decrease Outcome: Progressing Goal: Ability to modify response to factors that promote anxiety will improve Outcome: Progressing   Problem: Education: Goal: Utilization of techniques to improve thought processes will improve Outcome: Progressing Goal: Knowledge of the prescribed therapeutic regimen will improve Outcome: Progressing   Problem:  Activity: Goal: Interest or engagement in leisure activities will improve Outcome: Progressing Goal: Imbalance in normal sleep/wake cycle will improve Outcome: Progressing   Problem: Coping: Goal: Coping ability will improve Outcome: Progressing Goal: Will verbalize feelings Outcome: Progressing   Problem: Health Behavior/Discharge Planning: Goal: Ability to make decisions will improve Outcome: Progressing Goal: Compliance with therapeutic regimen will improve Outcome: Progressing   Problem: Role Relationship: Goal: Will demonstrate positive changes in social behaviors and relationships Outcome: Progressing   Problem: Safety: Goal: Ability to disclose and discuss suicidal ideas will improve Outcome: Progressing Goal: Ability to identify and utilize support systems that promote safety will improve Outcome: Progressing   Problem: Self-Concept: Goal: Will verbalize positive feelings about self Outcome: Progressing Goal: Level of anxiety will decrease Outcome: Progressing   Problem: Education: Goal: Ability to make informed decisions regarding treatment will improve Outcome: Progressing   Problem: Coping: Goal: Coping ability will improve Outcome: Progressing   Problem: Health Behavior/Discharge Planning: Goal: Identification of resources available to assist in meeting health care needs will improve Outcome: Progressing   Problem: Medication: Goal: Compliance with prescribed medication regimen will improve Outcome: Progressing   Problem: Self-Concept: Goal: Ability to disclose and discuss suicidal ideas will improve Outcome: Progressing Goal: Will verbalize positive feelings about self Outcome: Progressing Note: Patient is on track. Patient will work on increased adherence

## 2023-10-12 NOTE — Plan of Care (Signed)
   Problem: Education: Goal: Emotional status will improve Outcome: Progressing Goal: Mental status will improve Outcome: Progressing

## 2023-10-12 NOTE — Group Note (Signed)
 [facility] LCSW Group Therapy Note   Group Date: 10/12/2023 Start Time: 1300 End Time: 1400   Type of Therapy/Topic:  Group Therapy:  Emotion Regulation  Participation Level:  Active   Mood:  Description of Group:    The purpose of this group is to assist patients in learning to regulate negative emotions and experience positive emotions. Patients will be guided to discuss ways in which they have been vulnerable to their negative emotions. These vulnerabilities will be juxtaposed with experiences of positive emotions or situations, and patients challenged to use positive emotions to combat negative ones. Special emphasis will be placed on coping with negative emotions in conflict situations, and patients will process healthy conflict resolution skills.  Therapeutic Goals: Patient will identify two positive emotions or experiences to reflect on in order to balance out negative emotions:  Patient will label two or more emotions that they find the most difficult to experience:  Patient will be able to demonstrate positive conflict resolution skills through discussion or role plays:   Summary of Patient Progress:   The patient actively participated in group, openly sharing personal experiences related to managing complex triggers. Together with peers and the group facilitator, the patient engaged in brainstorming coping strategies addressing these triggers at both micro and macro levels. The group collaboratively explored various life domains--such as family, identity, career, religion, and social supports--identifying whether these areas placed them in the blue, green, yellow, or red emotional zones. For domains identified in the yellow or red zones, the group held in-depth discussions to recognize specific triggers and explore strategies for healthier coping and improved well-being.    Therapeutic Modalities:   Cognitive Behavioral Therapy Feelings Identification Dialectical Behavioral  Therapy   Alveta CHRISTELLA Kerns, LCSW

## 2023-10-12 NOTE — Group Note (Signed)
 Date:  10/12/2023 Time:  12:57 PM  Group Topic/Focus:  Dimensions of Wellness:   The focus of this group is to introduce the topic of wellness and discuss the role each dimension of wellness plays in total health.    Participation Level:  Active  Participation Quality:  Appropriate  Affect:  Appropriate  Cognitive:  Appropriate  Insight: Appropriate  Engagement in Group:  Engaged  Modes of Intervention:  Activity  Additional Comments:    [name] Boruch Manuele 10/12/2023, 12:57 PM

## 2023-10-12 NOTE — Progress Notes (Signed)
   10/12/23 1400  Psych Admission Type (Psych Patients Only)  Admission Status Involuntary  Psychosocial Assessment  Patient Complaints Insomnia (it was reported that patient was up all night; patient stated that she just wasn't tired.)  Eye Contact Fair  Facial Expression Flat  Affect Flat  Speech Logical/coherent;Soft  Interaction Assertive  Motor Activity Slow  Appearance/Hygiene Unremarkable  Behavior Characteristics Cooperative;Appropriate to situation  Mood Pleasant  Aggressive Behavior  Effect No apparent injury  Thought Process  Coherency WDL  Content WDL  Delusions None reported or observed  Perception WDL  Hallucination None reported or observed  Judgment WDL  Confusion None  Danger to Self  Current suicidal ideation? Denies  Self-Injurious Behavior No self-injurious ideation or behavior indicators observed or expressed   Agreement Not to Harm Self Yes  Description of Agreement Verbal  Danger to Others  Danger to Others None reported or observed   Patient's goal for today, per her self-inventory is getting out so I can get my life together, in which she will stay focused and positive, in order to achieve her goal.

## 2023-10-12 NOTE — Group Note (Signed)
 Date:  10/12/2023 Time:  10:39 AM  Group Topic/Focus:  Building Self Esteem:   The Focus of this group is helping patients become aware of the effects of self-esteem on their lives, the things they and others do that enhance or undermine their self-esteem, seeing the relationship between their level of self-esteem and the choices they make and learning ways to enhance self-esteem.    Participation Level:  Active  Participation Quality:  Appropriate  Affect:  Appropriate  Cognitive:  Appropriate  Insight: Appropriate  Engagement in Group:  Engaged  Modes of Intervention:  Activity  Additional Comments:    [name] Casey Fuentes 10/12/2023, 10:39 AM

## 2023-10-12 NOTE — Progress Notes (Signed)
 [facility] MD Progress Note  10/12/2023 10:02 AM Casey Fuentes  MRN:  [id number]   Subjective:  Chart reviewed, case discussed in multidisciplinary meeting, patient seen during rounds.   Patient seen today for follow-up psychiatric evaluation. Patient was approached while at the nursing station. She was noted to be cooperative, relaxed, and engaged in discussion.  She reports poor sleep, specifically with difficulty initiating sleep. We discussed the possibility of medication changes, and she initially requested Seroquel but then quickly stated, "No, I think the Vraylar  is working." Education was provided regarding the metabolic side effects of adding a second antipsychotic, and at this time, Seroquel will not be initiated. Instead, patient agreed to trial of melatonin 3 mg PO at bedtime for sleep support.  She reports that sleep is typically not a problem at home and attributes current difficulty to the hospital setting. She continues to tolerate Vraylar  well, and based on her report of progress with mood and medication response, Vraylar  will be increased to 3 mg daily for therapeutic effect. Education provided regarding gradual titration.  Patient continues to demonstrate improved insight and reports feeling that the medication is helping. She confirms her discharge plan is to return home to live with her mother, whom she identifies as supportive. She recalls speaking with her mother earlier this week and remains interested in engaging in therapy following discharge.  In yesterday's discussion, patient acknowledged past overdose attempts involving fentanyl , stating she was "Narcan'd five times" and at the time "didn't care whether I lived or died." She reports she has not used fentanyl  in over a year, though identifies it as a substance she has considered in the context of past suicidality. Today, she denies any suicidal or homicidal ideation and remains reality-based during the interview.  Denies  medication side effects and there are none noted. Medication education provided to include risks, benefits, and side effects. Patient remains appropriate for the inpatient psychiatric setting for safety, medication management, and symptom stabilization.    Sleep: reports difficulty falling asleep   Appetite:  Good  Past Psychiatric History: see h&P Family History:  Family History  Problem Relation Age of Onset   Diabetes Mother    Thyroid  disease Mother    Cirrhosis Mother    Healthy Father    Social History:  Social History   Substance and Sexual Activity  Alcohol Use Not Currently   Comment: None currently     Social History   Substance and Sexual Activity  Drug Use Yes   Types: Marijuana, Cocaine, Benzodiazepines, Fentanyl , MDMA (Ecstacy), Hydrocodone, Oxycodone    Comment: Xanax about every two or three days; Suboxone strips about every couple days or so, not often    Social History   Socioeconomic History   Marital status: Single    Spouse name: Not on file   Number of children: Not on file   Years of education: Not on file   Highest education level: Not on file  Occupational History   Not on file  Tobacco Use   Smoking status: Never   Smokeless tobacco: Current   Tobacco comments:    Vaping nicotine   Vaping Use   Vaping status: Every Day   Substances: Nicotine   Substance and Sexual Activity   Alcohol use: Not Currently    Comment: None currently   Drug use: Yes    Types: Marijuana, Cocaine, Benzodiazepines, Fentanyl , MDMA (Ecstacy), Hydrocodone, Oxycodone     Comment: Xanax about every two or three days; Suboxone strips about every couple days or  so, not often   Sexual activity: Yes    Partners: Male    Birth control/protection: None, I.U.D.  Other Topics Concern   Not on file  Social History Narrative   Not on file   Social Drivers of Health   Financial Resource Strain: Not on file  Food Insecurity: No Food Insecurity (10/09/2023)    Hunger Vital Sign    Worried About Running Out of Food in the Last Year: Never true    Ran Out of Food in the Last Year: Never true  Transportation Needs: No Transportation Needs (10/09/2023)   PRAPARE - Administrator, Civil Service (Medical): No    Lack of Transportation (Non-Medical): No  Physical Activity: Not on file  Stress: Not on file  Social Connections: Not on file   Past Medical History:  Past Medical History:  Diagnosis Date   Anxiety 10/22/2017   Chlamydia infection during pregnancy 02/03/2017   Deliberate self-cutting 10/22/2017   Age [age]   Dyspnea    Morbid obesity (HCC) 06/01/2017   Postpartum depression 10/22/2017    Past Surgical History:  Procedure Laterality Date   CESAREAN SECTION N/A 08/02/2017   Procedure: CESAREAN SECTION;  Surgeon: Connell Davies, MD;  Location: [facility] ORS;  Service: Obstetrics;  Laterality: N/A;   TONSILLECTOMY      Current Medications: Current Facility-Administered Medications  Medication Dose Route Frequency Provider Last Rate Last Admin   acetaminophen  (TYLENOL ) tablet 650 mg  650 mg Oral Q6H PRN Mannie Jerel PARAS, NP   650 mg at 10/12/23 0840   alum & mag hydroxide-simeth (MAALOX/MYLANTA) 200-200-20 MG/5ML suspension 30 mL  30 mL Oral Q4H PRN Mannie Jerel PARAS, NP       NOREEN ON 10/13/2023] cariprazine  (VRAYLAR ) capsule 3 mg  3 mg Oral Daily Cleotilde Hoy HERO, NP       haloperidol (HALDOL) tablet 5 mg  5 mg Oral TID PRN Mannie Jerel PARAS, NP       And   diphenhydrAMINE  (BENADRYL ) capsule 50 mg  50 mg Oral TID PRN Mannie Jerel PARAS, NP   50 mg at 10/11/23 2057   haloperidol lactate (HALDOL) injection 5 mg  5 mg Intramuscular TID PRN Mannie Jerel PARAS, NP       And   diphenhydrAMINE  (BENADRYL ) injection 50 mg  50 mg Intramuscular TID PRN Mannie Jerel PARAS, NP       And   LORazepam (ATIVAN) injection 2 mg  2 mg Intramuscular TID PRN Mannie Jerel PARAS, NP       haloperidol lactate (HALDOL) injection 10 mg  10 mg Intramuscular TID  PRN Mannie Jerel PARAS, NP       And   diphenhydrAMINE  (BENADRYL ) injection 50 mg  50 mg Intramuscular TID PRN Mannie Jerel PARAS, NP       And   LORazepam (ATIVAN) injection 2 mg  2 mg Intramuscular TID PRN Mannie Jerel PARAS, NP       magnesium  hydroxide (MILK OF MAGNESIA) suspension 30 mL  30 mL Oral Daily PRN Mannie Jerel PARAS, NP       nicotine  (NICODERM CQ  - dosed in mg/24 hours) patch 21 mg  21 mg Transdermal Q0600 Millington, Matthew E, PA-C   21 mg at 10/11/23 1703   traZODone  (DESYREL ) tablet 50 mg  50 mg Oral QHS PRN Mannie Jerel PARAS, NP   50 mg at 10/11/23 2057    Lab Results:  No results found for this or any previous visit (from the  past 48 hours).   Blood Alcohol level:  Lab Results  Component Value Date   [facility] <15 10/07/2023   ETH <10 11/28/2021    Metabolic Disorder Labs: Lab Results  Component Value Date   HGBA1C 4.4 (L) 10/10/2023   MPG 79.58 10/10/2023   MPG 76.71 12/01/2021   No results found for: PROLACTIN Lab Results  Component Value Date   CHOL 163 10/10/2023   TRIG 66 10/10/2023   HDL 42 10/10/2023   CHOLHDL 3.9 10/10/2023   VLDL 13 10/10/2023   LDLCALC 108 (H) 10/10/2023   LDLCALC 83 12/01/2021      Psychiatric Specialty Exam:  Presentation  General Appearance:  Appropriate for Environment  Eye Contact: Fair  Speech: Clear and Coherent  Speech Volume: Normal    Mood and Affect  Mood: Depressed  Affect: Flat   Thought Process  Thought Processes: Coherent; Linear  Descriptions of Associations:Intact  Orientation:Full (Time, Place and Person)  Thought Content:Logical  Hallucinations:No data recorded  Ideas of Reference:None  Suicidal Thoughts:No data recorded  Homicidal Thoughts:No data recorded   Sensorium  Memory: Immediate Good  Judgment: Poor  Insight: Poor   Executive Functions  Concentration: Fair  Attention Span: Fair  Recall: [employer] of  Knowledge: Fair  Language: Fair   Psychomotor Activity  Psychomotor Activity: No data recorded  Musculoskeletal: Strength & Muscle Tone: within normal limits Gait & Station: normal Assets  Assets: Manufacturing systems engineer; Desire for Improvement    Physical Exam: Physical Exam ROS Blood pressure 115/81, pulse 74, temperature 98 F (36.7 C), resp. rate (!) 21, height 5' 1 (1.549 m), weight 99.3 kg, SpO2 100%. Body mass index is 41.38 kg/m.  Diagnosis: Principal Problem:   Bipolar 1 disorder, depressed, severe (HCC)   PLAN: Safety and Monitoring:  -- Voluntary admission to inpatient psychiatric unit for safety, stabilization and treatment  -- Daily contact with patient to assess and evaluate symptoms and progress in treatment  -- Patient's case to be discussed in multi-disciplinary team meeting  -- Observation Level : q15 minute checks  -- Vital signs:  q12 hours  -- Precautions: suicide, elopement, and assault -- Encouraged patient to participate in unit milieu and in scheduled group therapies  2. Psychiatric Diagnoses and Treatment:   Bipolar Disorder, current episode depressed (with psychotic or mixed features):  Increase Vraylar  to 3 mg daily for therapeutic effect  Melatonin 3 mg PO QHS initiated for sleep difficulty  No current SI/HI or psychotic features  Patient demonstrates insight and engagement in care  Denies medication side effects and there are none noted  Medication education provided to include risks, benefits, and side effects  Patient verbalized understanding and agrees to plan of care  Patient remains appropriate for inpatient psychiatric setting for safety, medication management, and stabilization given today's medication adjustment  Substance Use History:  Prior fentanyl  overdose and Narcan reversal x5  Last use over a year ago  No current cravings or use reported  Continue to monitor as a historical risk factor  Discharge  Planning:  Anticipating discharge by end of the week once medication titration complete  Plan to return home to live with supportive mother  Patient motivated to engage in therapy after discharge  Encourage open discussion about relapse risks and emotional safety related to substance use history.  Plan to call mother for discharge coordination and follow-up planning.  Assess ongoing readiness for discharge based on continued symptom stabilization and insight.        3.  Medical Issues Being Addressed: NA    4. Discharge Planning:   -- Social work and case management to assist with discharge planning and identification of hospital follow-up needs prior to discharge  -- Estimated LOS: 3-4 days  Hoy CHRISTELLA Pinal, NP 10/12/2023, 10:02 AM

## 2023-10-12 NOTE — B[facility] Counselor (Signed)
 CSW met with pt to discuss aftercare briefly. CSW brought up [facility]. Pt stated that she does not like [facility] and would like to follow up with another provider. No other concerns expressed. Contact ended without incident.    Pt and CSW met to schedule appointment through [facility]. Appointment was not able to be scheduled due to lack of insurance information. CSW will continue to look at other providers in community. No other concerns expressed. Contact ended without incident.   Nadara SAUNDERS. Chaim, MSW, LCSW, LCAS 10/12/2023 3:24 PM

## 2023-10-13 MED ORDER — CARIPRAZINE HCL 3 MG PO CAPS: 3.0000 mg | ORAL_CAPSULE | Freq: Every day | ORAL | 0 refills | Status: AC

## 2023-10-13 NOTE — Plan of Care (Signed)
  Problem: Education: Goal: Knowledge of [facility] Education information/materials will improve Outcome: Progressing Goal: Emotional status will improve Outcome: Progressing Goal: Mental status will improve Outcome: Progressing Goal: Verbalization of understanding the information provided will improve Outcome: Progressing   Problem: Activity: Goal: Interest or engagement in activities will improve Outcome: Progressing Goal: Sleeping patterns will improve Outcome: Progressing   Problem: Coping: Goal: Ability to verbalize frustrations and anger appropriately will improve Outcome: Progressing Goal: Ability to demonstrate self-control will improve Outcome: Progressing   Problem: Health Behavior/Discharge Planning: Goal: Identification of resources available to assist in meeting health care needs will improve Outcome: Progressing Goal: Compliance with treatment plan for underlying cause of condition will improve Outcome: Progressing   Problem: Physical Regulation: Goal: Ability to maintain clinical measurements within normal limits will improve Outcome: Progressing   Problem: Safety: Goal: Periods of time without injury will increase Outcome: Progressing   Problem: Education: Goal: Ability to state activities that reduce stress will improve Outcome: Progressing   Problem: Coping: Goal: Ability to identify and develop effective coping behavior will improve Outcome: Progressing   Problem: Self-Concept: Goal: Ability to identify factors that promote anxiety will improve Outcome: Progressing Goal: Level of anxiety will decrease Outcome: Progressing Goal: Ability to modify response to factors that promote anxiety will improve Outcome: Progressing   Problem: Education: Goal: Utilization of techniques to improve thought processes will improve Outcome: Progressing Goal: Knowledge of the prescribed therapeutic regimen will improve Outcome: Progressing   Problem:  Activity: Goal: Interest or engagement in leisure activities will improve Outcome: Progressing Goal: Imbalance in normal sleep/wake cycle will improve Outcome: Progressing   Problem: Coping: Goal: Coping ability will improve Outcome: Progressing Goal: Will verbalize feelings Outcome: Progressing   Problem: Health Behavior/Discharge Planning: Goal: Ability to make decisions will improve Outcome: Progressing Goal: Compliance with therapeutic regimen will improve Outcome: Progressing   Problem: Role Relationship: Goal: Will demonstrate positive changes in social behaviors and relationships Outcome: Progressing   Problem: Safety: Goal: Ability to disclose and discuss suicidal ideas will improve Outcome: Progressing Goal: Ability to identify and utilize support systems that promote safety will improve Outcome: Progressing   Problem: Self-Concept: Goal: Will verbalize positive feelings about self Outcome: Progressing Goal: Level of anxiety will decrease Outcome: Progressing   Problem: Education: Goal: Ability to make informed decisions regarding treatment will improve Outcome: Progressing   Problem: Coping: Goal: Coping ability will improve Outcome: Progressing   Problem: Health Behavior/Discharge Planning: Goal: Identification of resources available to assist in meeting health care needs will improve Outcome: Progressing   Problem: Medication: Goal: Compliance with prescribed medication regimen will improve Outcome: Progressing   Problem: Self-Concept: Goal: Ability to disclose and discuss suicidal ideas will improve Outcome: Progressing Goal: Will verbalize positive feelings about self Outcome: Progressing Note: Patient is on track. Patient will maintain adherence and work on increased adherence

## 2023-10-13 NOTE — Group Note (Signed)
 [facility] LCSW Group Therapy Note   Group Date: 10/13/2023 Start Time: 1300 End Time: 1350   Type of Therapy/Topic:  Group Therapy:  Balance in Life  Participation Level:  Minimal   Description of Group:    This group will address the concept of balance and how it feels and looks when one is unbalanced. Patients will be encouraged to process areas in their lives that are out of balance, and identify reasons for remaining unbalanced. Facilitators will guide patients utilizing problem- solving interventions to address and correct the stressor making their life unbalanced. Understanding and applying boundaries will be explored and addressed for obtaining  and maintaining a balanced life. Patients will be encouraged to explore ways to assertively make their unbalanced needs known to significant others in their lives, using other group members and facilitator for support and feedback.  Therapeutic Goals: Patient will identify two or more emotions or situations they have that consume much of in their lives. Patient will identify signs/triggers that life has become out of balance:  Patient will identify two ways to set boundaries in order to achieve balance in their lives:  Patient will demonstrate ability to communicate their needs through discussion and/or role plays  Summary of Patient Progress: Patient was present for the entirety of the group process. She spoke more during group but was not as actively engaged as others. However, she appeared to attend to the conversation. Insight into the topic and herself remains questionable. She appeared open and receptive to feedback/comments from both her peers and the facilitator.    Therapeutic Modalities:   Cognitive Behavioral Therapy Solution-Focused Therapy Assertiveness Training   Nadara JONELLE Fam, LCSW

## 2023-10-13 NOTE — Progress Notes (Signed)
 [facility] MD Progress Note  10/13/2023 4:39 PM Casey Fuentes  MRN:  [id number]   Subjective:  Chart reviewed, case discussed in multidisciplinary meeting, patient seen during rounds.   Patient seen today for follow-up psychiatric evaluation. Patient was observed actively engaged in the milieu with peers without evidence of inappropriate or self-harm behaviors. Upon approach, she was cooperative, calm, and appropriate for interview.  Subjective:Patient reports improved sleep from the night prior and endorses improvement in mood following recent medication changes. She expresses continued motivation for discharge, which is planned for tomorrow to home with her mother. She reports feeling safe and supported. She continues to recognize past experiences of suicidal and homicidal ideation, paranoia, and delusions, but denies any current symptoms. She verbalizes that medication is helping and reports good communication with her mother.  Patient continues to demonstrate improved insight and reports feeling that the medication is helping. She confirms her discharge plan is to return home to live with her mother, whom she identifies as supportive. She recalls speaking with her mother earlier this week and remains in  Denies medication side effects and there are none noted. Medication education provided to include risks, benefits, and side effects. Patient remains appropriate for the inpatient psychiatric setting for safety, medication management, and symptom stabilization.    Sleep: reports difficulty falling asleep -improved  Appetite:  Good  Past Psychiatric History: see h&P Family History:  Family History  Problem Relation Age of Onset   Diabetes Mother    Thyroid  disease Mother    Cirrhosis Mother    Healthy Father    Social History:  Social History   Substance and Sexual Activity  Alcohol Use Not Currently   Comment: None currently     Social History   Substance and Sexual Activity   Drug Use Yes   Types: Marijuana, Cocaine, Benzodiazepines, Fentanyl , MDMA (Ecstacy), Hydrocodone, Oxycodone    Comment: Xanax about every two or three days; Suboxone strips about every couple days or so, not often    Social History   Socioeconomic History   Marital status: Single    Spouse name: Not on file   Number of children: Not on file   Years of education: Not on file   Highest education level: Not on file  Occupational History   Not on file  Tobacco Use   Smoking status: Never   Smokeless tobacco: Current   Tobacco comments:    Vaping nicotine   Vaping Use   Vaping status: Every Day   Substances: Nicotine   Substance and Sexual Activity   Alcohol use: Not Currently    Comment: None currently   Drug use: Yes    Types: Marijuana, Cocaine, Benzodiazepines, Fentanyl , MDMA (Ecstacy), Hydrocodone, Oxycodone     Comment: Xanax about every two or three days; Suboxone strips about every couple days or so, not often   Sexual activity: Yes    Partners: Male    Birth control/protection: None, I.U.D.  Other Topics Concern   Not on file  Social History Narrative   Not on file   Social Drivers of Health   Financial Resource Strain: Not on file  Food Insecurity: No Food Insecurity (10/09/2023)   Hunger Vital Sign    Worried About Running Out of Food in the Last Year: Never true    Ran Out of Food in the Last Year: Never true  Transportation Needs: No Transportation Needs (10/09/2023)   PRAPARE - Administrator, Civil Service (Medical): No    Lack of Transportation (  Non-Medical): No  Physical Activity: Not on file  Stress: Not on file  Social Connections: Not on file   Past Medical History:  Past Medical History:  Diagnosis Date   Anxiety 10/22/2017   Chlamydia infection during pregnancy 02/03/2017   Deliberate self-cutting 10/22/2017   Age [age]   Dyspnea    Morbid obesity (HCC) 06/01/2017   Postpartum depression 10/22/2017    Past Surgical History:   Procedure Laterality Date   CESAREAN SECTION N/A 08/02/2017   Procedure: CESAREAN SECTION;  Surgeon: Connell Davies, MD;  Location: [facility] ORS;  Service: Obstetrics;  Laterality: N/A;   TONSILLECTOMY      Current Medications: Current Facility-Administered Medications  Medication Dose Route Frequency Provider Last Rate Last Admin   acetaminophen  (TYLENOL ) tablet 650 mg  650 mg Oral Q6H PRN Mannie Jerel PARAS, NP   650 mg at 10/12/23 1444   alum & mag hydroxide-simeth (MAALOX/MYLANTA) 200-200-20 MG/5ML suspension 30 mL  30 mL Oral Q4H PRN Mannie Jerel PARAS, NP       cariprazine  (VRAYLAR ) capsule 3 mg  3 mg Oral Daily Cleotilde Hoy HERO, NP   3 mg at 10/13/23 1059   haloperidol (HALDOL) tablet 5 mg  5 mg Oral TID PRN Mannie Jerel PARAS, NP       And   diphenhydrAMINE  (BENADRYL ) capsule 50 mg  50 mg Oral TID PRN Mannie Jerel PARAS, NP   50 mg at 10/12/23 2044   haloperidol lactate (HALDOL) injection 5 mg  5 mg Intramuscular TID PRN Mannie Jerel PARAS, NP       And   diphenhydrAMINE  (BENADRYL ) injection 50 mg  50 mg Intramuscular TID PRN Mannie Jerel PARAS, NP       And   LORazepam (ATIVAN) injection 2 mg  2 mg Intramuscular TID PRN Mannie Jerel PARAS, NP       haloperidol lactate (HALDOL) injection 10 mg  10 mg Intramuscular TID PRN Mannie Jerel PARAS, NP       And   diphenhydrAMINE  (BENADRYL ) injection 50 mg  50 mg Intramuscular TID PRN Mannie Jerel PARAS, NP       And   LORazepam (ATIVAN) injection 2 mg  2 mg Intramuscular TID PRN Mannie Jerel PARAS, NP       magnesium  hydroxide (MILK OF MAGNESIA) suspension 30 mL  30 mL Oral Daily PRN Mannie Jerel PARAS, NP       melatonin tablet 2.5 mg  2.5 mg Oral QHS Cleotilde Hoy HERO, NP   2.5 mg at 10/12/23 2044   nicotine  (NICODERM CQ  - dosed in mg/24 hours) patch 21 mg  21 mg Transdermal Daily Jadapalle, Sree, MD   21 mg at 10/13/23 1058   nicotine  polacrilex (NICORETTE) gum 2 mg  2 mg Oral PRN Cleotilde Hoy HERO, NP   2 mg at 10/12/23 1515   traZODone  (DESYREL ) tablet  50 mg  50 mg Oral QHS PRN Mannie Jerel PARAS, NP   50 mg at 10/12/23 2132    Lab Results:  No results found for this or any previous visit (from the past 48 hours).   Blood Alcohol level:  Lab Results  Component Value Date   [facility] <15 10/07/2023   ETH <10 11/28/2021    Metabolic Disorder Labs: Lab Results  Component Value Date   HGBA1C 4.4 (L) 10/10/2023   MPG 79.58 10/10/2023   MPG 76.71 12/01/2021   No results found for: PROLACTIN Lab Results  Component Value Date   CHOL 163 10/10/2023  TRIG 66 10/10/2023   HDL 42 10/10/2023   CHOLHDL 3.9 10/10/2023   VLDL 13 10/10/2023   LDLCALC 108 (H) 10/10/2023   LDLCALC 83 12/01/2021      Psychiatric Specialty Exam:  Presentation  General Appearance:  Appropriate for Environment; Casual  Eye Contact: Good  Speech: Normal Rate  Speech Volume: Normal    Mood and Affect  Mood: Euthymic  Affect: Congruent   Thought Process  Thought Processes: Coherent  Descriptions of Associations:Intact  Orientation:Full (Time, Place and Person)  Thought Content:Logical  Hallucinations:Hallucinations: None   Ideas of Reference:None  Suicidal Thoughts:Suicidal Thoughts: No   Homicidal Thoughts:Homicidal Thoughts: No    Sensorium  Memory: Immediate Good; Remote Good  Judgment: Fair  Insight: Fair   Art therapist  Concentration: Good  Attention Span: Fair  Recall: Fair  Fund of Knowledge: Fair  Language: Fair   Psychomotor Activity  Psychomotor Activity: Psychomotor Activity: Normal   Musculoskeletal: Strength & Muscle Tone: within normal limits Gait & Station: normal Assets  Assets: Manufacturing systems engineer; Housing; Physical Health; Social Support; Resilience    Physical Exam: Physical Exam ROS Blood pressure 119/66, pulse 83, temperature 98.1 F (36.7 C), resp. rate (!) 21, height 5' 1 (1.549 m), weight 99.3 kg, SpO2 100%. Body mass index is 41.38 kg/m.  Diagnosis:  Principal Problem:   Bipolar 1 disorder, depressed, severe (HCC)   PLAN: Safety and Monitoring:  -- Voluntary admission to inpatient psychiatric unit for safety, stabilization and treatment  -- Daily contact with patient to assess and evaluate symptoms and progress in treatment  -- Patient's case to be discussed in multi-disciplinary team meeting  -- Observation Level : q15 minute checks  -- Vital signs:  q12 hours  -- Precautions: suicide, elopement, and assault -- Encouraged patient to participate in unit milieu and in scheduled group therapies  2. Psychiatric Diagnoses and Treatment:   Bipolar Disorder, current episode depressed (with psychotic or mixed features):  Increase Vraylar  to 3 mg daily for therapeutic effect  Melatonin 3 mg PO QHS initiated for sleep difficulty  No current SI/HI or psychotic features  Patient demonstrates insight and engagement in care  Denies medication side effects and there are none noted  Medication education provided to include risks, benefits, and side effects  Patient verbalized understanding and agrees to plan of care  Patient remains appropriate for inpatient psychiatric setting for safety, medication management, and stabilization given today's medication adjustment  Substance Use History:  Prior fentanyl  overdose and Narcan reversal x5  Last use over a year ago  No current cravings or use reported  Continue to monitor as a historical risk factor  Discharge Planning:  Anticipating discharge by end of the week once medication titration complete  Plan to return home to live with supportive mother  Patient motivated to engage in therapy after discharge  Encourage open discussion about relapse risks and emotional safety related to substance use history.  Plan to call mother for discharge coordination and follow-up planning.  Assess ongoing readiness for discharge based on continued symptom stabilization and  insight.        3. Medical Issues Being Addressed: NA    4. Discharge Planning:   -- Social work and case management to assist with discharge planning and identification of hospital follow-up needs prior to discharge  -- Estimated LOS: 3-4 days  Hoy CHRISTELLA Pinal, NP 10/13/2023, 4:39 PM

## 2023-10-13 NOTE — Group Note (Signed)
 Date:  10/13/2023 Time:  5:10 PM  Group Topic/Focus:  Wellness Toolbox:   The focus of this group is to discuss various aspects of wellness, balancing those aspects and exploring ways to increase the ability to experience wellness.  Patients will create a wellness toolbox for use upon discharge.    Participation Level:  Active  Participation Quality:  Appropriate  Affect:  Appropriate  Cognitive:  Appropriate  Insight: Appropriate  Engagement in Group:  Engaged  Modes of Intervention:  Activity and Socialization  Additional Comments:    Deitra Caron Mainland 10/13/2023, 5:10 PM

## 2023-10-13 NOTE — Progress Notes (Signed)
 Patient is asleep, so this writer will administer scheduled medication once she wakes up.

## 2023-10-13 NOTE — Plan of Care (Signed)
   Problem: Education: Goal: Emotional status will improve Outcome: Progressing Goal: Mental status will improve Outcome: Progressing

## 2023-10-13 NOTE — Progress Notes (Signed)
   10/13/23 1100  Psych Admission Type (Psych Patients Only)  Admission Status Involuntary  Psychosocial Assessment  Patient Complaints Sleep disturbance (patient states that she had scary dreams last night.)  Eye Contact Fair  Facial Expression Flat  Affect Flat  Speech Logical/coherent;Soft  Interaction Assertive  Motor Activity Slow  Appearance/Hygiene Unremarkable  Behavior Characteristics Cooperative;Appropriate to situation  Mood Pleasant (patient states that overall she is feeling good.)  Aggressive Behavior  Effect No apparent injury  Thought Process  Coherency WDL  Content WDL  Delusions None reported or observed  Perception WDL  Hallucination None reported or observed  Judgment WDL  Confusion None  Danger to Self  Current suicidal ideation? Denies  Self-Injurious Behavior No self-injurious ideation or behavior indicators observed or expressed   Agreement Not to Harm Self Yes  Description of Agreement Verbal  Danger to Others  Danger to Others None reported or observed   Patient's goal for today, per her self-inventory is to work on discharge plan and go home, in which she will answer all questions, in order to achieve her goal.

## 2023-10-13 NOTE — Group Note (Signed)
 Date:  10/13/2023 Time:  9:23 PM  Group Topic/Focus:  Coping With Mental Health Crisis:   The purpose of this group is to help patients identify strategies for coping with mental health crisis.  Group discusses possible causes of crisis and ways to manage them effectively. Identifying Needs:   The focus of this group is to help patients identify their personal needs that have been historically problematic and identify healthy behaviors to address their needs.    Participation Level:  Active  Participation Quality:  Appropriate  Affect:  Appropriate  Cognitive:  Appropriate  Insight: Limited  Engagement in Group:  Supportive  Modes of Intervention:  Discussion  Additional Comments:  n/a  Deann Mclaine L 10/13/2023, 9:23 PM

## 2023-10-13 NOTE — Group Note (Signed)
 Date:  10/13/2023 Time:  12:31 AM  Group Topic/Focus:  Overcoming Stress:   The focus of this group is to define stress and help patients assess their triggers.    Participation Level:  Active  Participation Quality:  Appropriate and Attentive  Affect:  Appropriate  Cognitive:  Alert and Appropriate  Insight: Appropriate and Good  Engagement in Group:  Developing/Improving and Engaged  Modes of Intervention:  Clarification, Discussion, Rapport Building, and Support  Additional Comments:  none     Casey Fuentes 10/13/2023, 12:31 AM

## 2023-10-13 NOTE — Group Note (Signed)
 Date:  10/13/2023 Time:  6:06 PM  Group Topic/Focus:  Goals Group:   The focus of this group is to help patients establish daily goals to achieve during treatment and discuss how the patient can incorporate goal setting into their daily lives to aide in recovery.    Participation Level:  Active  Participation Quality:  Appropriate  Affect:  Appropriate  Cognitive:  Alert  Insight: Appropriate  Engagement in Group:  Engaged  Modes of Intervention:  Activity, Discussion, and Education  Additional Comments:    Deitra Caron Mainland 10/13/2023, 6:06 PM

## 2023-10-13 NOTE — Progress Notes (Signed)
 Pt calm and pleasant during assessment denying SI/HI/AVH. Pt observed by this Clinical research associate interacting appropriately with staff and peers on the unit. Pt compliant with medication administration per MD orders. Pt given education, support, and encouragement to be active in her treatment plan. Pt being monitored Q 15 minutes for safety per unit protocol, remains safe on the unit

## 2023-10-14 NOTE — Progress Notes (Signed)
   10/14/23 0900  Psych Admission Type (Psych Patients Only)  Admission Status Involuntary  Psychosocial Assessment  Patient Complaints None  Eye Contact Fair  Facial Expression Flat  Affect Flat  Speech Logical/coherent  Interaction Superficial;Minimal (one word answers for all assessment questions)  Motor Activity Slow  Appearance/Hygiene Unremarkable  Behavior Characteristics Cooperative  Mood Pleasant  Thought Process  Coherency WDL  Content WDL  Delusions None reported or observed  Perception WDL  Hallucination None reported or observed  Judgment WDL  Confusion None  Danger to Self  Current suicidal ideation? Denies

## 2023-10-14 NOTE — Group Note (Signed)
 Date:  10/14/2023 Time:  11:33 AM  Group Topic/Focus:  Healthy Communication:   The focus of this group is to discuss communication, barriers to communication, as well as healthy ways to communicate with others.    Participation Level:  Active  Participation Quality:  Appropriate  Affect:  Appropriate  Cognitive:  Appropriate  Insight: Appropriate  Engagement in Group:  Engaged  Modes of Intervention:  Activity and Socialization  Additional Comments:    Casey Fuentes 10/14/2023, 11:33 AM

## 2023-10-14 NOTE — Progress Notes (Signed)
 Discharge Note:  Patient denies SI/HI/AVH at this time. Discharge instructions, AVS, prescriptions, and transition record gone over with patient. Patient agrees to comply with medication management, follow-up visit, and outpatient therapy. Patient belongings returned to patient. Patient questions and concerns addressed and answered. Paper rx provided

## 2023-10-14 NOTE — Plan of Care (Signed)

## 2023-10-14 NOTE — Progress Notes (Signed)
  [facility] Adult Case Management Discharge Plan :  Will you be returning to the same living situation after discharge:  Yes,  pt plans to return home upon discharge.  At discharge, do you have transportation home?: Yes,  pt support system to provide transportation.  Do you have the ability to pay for your medications: Yes,  [place] MEDICAID PREPAID HEALTH PLAN / [place] MEDICAID [employer] [place]  Release of information consent forms completed and in the chart;  Patient's signature needed at discharge.  Patient to Follow up at:  Follow-up Information     [facility] Follow up.   Why: Your appointment is scheduled for 10/20/23 at 10 AM in office. Please arrive by 9:45 AM. Contact information: [address]  [address] [place] [address] [phone]                 Next level of care provider has access to [facility] Link:no  Safety Planning and Suicide Prevention discussed: Yes,  SPE completed with mother, Noela Brothers.      Has patient been referred to the Quitline?: Patient refused referral for treatment  Patient has been referred for addiction treatment: No known substance use disorder.  Nadara JONELLE Fam, LCSW 10/14/2023, 9:38 AM

## 2023-10-14 NOTE — Plan of Care (Signed)
   Problem: Education: Goal: Emotional status will improve Outcome: Progressing Goal: Mental status will improve Outcome: Progressing

## 2023-10-14 NOTE — [facility] Suicide Risk Assessment (Signed)
 [facility] Discharge Suicide Risk Assessment   Principal Problem: Bipolar 1 disorder, depressed, severe (HCC) Discharge Diagnoses: Principal Problem:   Bipolar 1 disorder, depressed, severe (HCC)   Total Time spent with patient: 30 minutes  Musculoskeletal: Strength & Muscle Tone: within normal limits Gait & Station: normal   Psychiatric Specialty Exam  Presentation  General Appearance:  Appropriate for Environment; Casual  Eye Contact: Good  Speech: Normal Rate  Speech Volume: Normal  Handedness: Right   Mood and Affect  Mood: Euthymic  Duration of Depression Symptoms: No data recorded Affect: Congruent   Thought Process  Thought Processes: Coherent  Descriptions of Associations:Intact  Orientation:Full (Time, Place and Person)  Thought Content:Logical  History of Schizophrenia/Schizoaffective disorder:No data recorded Duration of Psychotic Symptoms:No data recorded Hallucinations:Hallucinations: None  Ideas of Reference:None  Suicidal Thoughts:Suicidal Thoughts: No  Homicidal Thoughts:Homicidal Thoughts: No   Sensorium  Memory: Immediate Good; Remote Good  Judgment: Fair  Insight: Fair   Art therapist  Concentration: Good  Attention Span: Fair  Recall: [employer] of Knowledge: Fair  Language: Fair   Psychomotor Activity  Psychomotor Activity: Psychomotor Activity: Normal   Assets  Assets: Manufacturing systems engineer; Housing; Physical Health; Social Support; Resilience   Sleep  Sleep: Sleep: Fair  Estimated Sleeping Duration (Last 24 Hours): 8.75-10.75 hours  Physical Exam: Physical Exam ROS Blood pressure 112/86, pulse 80, temperature 98.2 F (36.8 C), resp. rate (!) 21, height 5' 1 (1.549 m), weight 99.3 kg, SpO2 99%. Body mass index is 41.38 kg/m.  Mental Status Per Nursing Assessment::   On Admission:  Suicidal ideation indicated by patient, Suicide plan  Demographic Factors:  Adolescent or young  adult  Loss Factors: NA  Historical Factors: Prior suicide attempts  Risk Reduction Factors:   Living with another person, especially a relative, Positive social support, Positive therapeutic relationship, and Positive coping skills or problem solving skills  Continued Clinical Symptoms:  Previous Psychiatric Diagnoses and Treatments  Cognitive Features That Contribute To Risk:  None    Suicide Risk:  Minimal: No identifiable suicidal ideation.  Patients presenting with no risk factors but with morbid ruminations; may be classified as minimal risk based on the severity of the depressive symptoms   Follow-up Information     [facility] Follow up.   Why: Your appointment is scheduled for 10/20/23 at 10 AM [place]. Please arrive by 9:45 AM. Contact information: [address]  [address] [place] [address] [phone]                  Hoy CHRISTELLA Pinal, NP 10/14/2023, 8:40 AM

## 2023-10-14 NOTE — Discharge Summary (Signed)
 Physician Discharge Summary Note  Patient:  Casey Fuentes is an 28 y.o., female MRN:  [id number] DOB:  23-Jul-1995 Patient phone:  [phone] (home)  Patient address:   [address] [place] [address],    Date of Admission:  10/09/2023 Date of Discharge: 10/14/23  Reason for Admission:   The patient is a 28 year old Caucasian female with a psychiatric history of bipolar I disorder, postpartum depression, unipolar depression, anxiety, insomnia, and substance use disorders who presented voluntarily for psychiatric evaluation due to worsening suicidal ideation over the past two months. She reports that her mental health has been progressively decompensating over the past five months, coinciding with multiple psychosocial stressors and substance use. She states that she brought herself to the hospital because her depression and anxiety have worsened significantly, and on the day of presentation she developed a specific plan to overdose on fentanyl . She describes her depression as "feeling nothing," rating it 6-7/10. She reports long-standing struggles with depression and anxiety, and notes that since moving in with her mother five months ago to help care for her post-liver transplant, she has had to quit her job, which has contributed to worsening symptoms. She also describes unresolved grief related to the death of her stepmother last year, stating that with more unstructured time she finds herself ruminating and feeling increasingly anxious and sad.  The patient endorses depressive symptoms including anhedonia, low energy, poor concentration, feelings of worthlessness, irritability, excessive sleep, and poor appetite. She inconsistently reports one prior suicide attempt via overdose several years ago, denied to this provider but was noted on ed interview. She denies current self-harm but notes a history of self-injury in adolescence. She describes anxiety symptoms including persistent  nervousness, restlessness, and difficulty relaxing, though denies panic attacks. She denies current homicidal ideation, hallucinations, or delusions, and was not acutely manic or psychotic on exam. She reports her mood as "no energy," and her affect was flat.    The patient also identifies family dynamics as a source of stress, particularly a lack of a close relationship with her father, although she states that they "get along well."   The patient has a psychiatric diagnosis of bipolar disorder made in 2023, and has a history of one prior psychiatric hospitalization for depression. She recalls trial of Seroquel in the past, which she found sedating but somewhat effective. She also trialed Abilify  and Lexapro , but discontinued due to akathisia. Zoloft  and Lexapro  are also found on chart review. She has not taken any psychiatric medications for the past year and is not currently followed by a provider. She was most recently seen by a provider in [place]. The ED provider started her on Vraylar , which she reports tolerating well without adverse effects. She has no sleep difficulties and denies any current medical diagnoses.  In addition to psychiatric symptoms, the patient has a history of substance use, including ongoing Xanax use (2-3 times weekly, most recent use on 6/18), intermittent Suboxone use (2-3 times weekly), and gives conflicting reports on marijuana use noting daily with ed provider but weekly on admission. She reports prior fentanyl  use, most recently a year ago. She also endorses past cocaine, Molly, and opioid use. She reports using substances primarily to relax and manage stress. UDS was positive for benzodiazepines and cannabinoids; BAL was negative. She denies any current alcohol use. She does not report current sleep difficulties. She denies a history of seizures or withdrawal symptoms.She has never participated in formal substance use treatment, but did attend Narcotics Anonymous  in the  past. She reports daily nicotine  use via vaping, stating she will fall asleep with her vape in her mouth, is provided nicotine  patch.    There is unclear hx of mania/hypomania there was paranoia noted on prior admission. Not pregnant and no plans on pregnancy.  Principal Problem: Bipolar 1 disorder, depressed, severe (HCC) Discharge Diagnoses: Principal Problem:   Bipolar 1 disorder, depressed, severe (HCC)   Past Psychiatric History:  Information collected from pt and chart review   Prev Dx/Sx: Bipolar I Disorder, Major Depressive Disorder, Postpartum Depression, Anxiety Disorder, Insomnia, Substance Use Disorders (Cannabis, Benzodiazepine, Opioid) Current Psych Provider: none Home Meds (current): none Previous Med Trials: Seroquel (sedation), Abilify  (akathisia), Lexapro  (neutral), Zoloft  (neutral) Therapy: none   Prior Psych Hospitalization: [facility] 8/23 Prior Self Harm: OD several years ago, and cutting in adolescence  Prior Violence: none reported   Family Psych History: none reported Family Hx suicide: none reported    Social History:  Developmental Hx: none Educational Hx: GED Occupational Hx: none Legal Hx: none Living Situation: with mother Spiritual Hx: Christian  Access to weapons/lethal means: no   Social History   Substance and Sexual Activity  Alcohol Use Not Currently   Comment: None currently     Social History   Substance and Sexual Activity  Drug Use Yes   Types: Marijuana, Cocaine, Benzodiazepines, Fentanyl , MDMA (Ecstacy), Hydrocodone, Oxycodone    Comment: Xanax about every two or three days; Suboxone strips about every couple days or so, not often    Social History   Socioeconomic History   Marital status: Single    Spouse name: Not on file   Number of children: Not on file   Years of education: Not on file   Highest education level: Not on file  Occupational History   Not on file  Tobacco Use   Smoking status: Never   Smokeless  tobacco: Current   Tobacco comments:    Vaping nicotine   Vaping Use   Vaping status: Every Day   Substances: Nicotine   Substance and Sexual Activity   Alcohol use: Not Currently    Comment: None currently   Drug use: Yes    Types: Marijuana, Cocaine, Benzodiazepines, Fentanyl , MDMA (Ecstacy), Hydrocodone, Oxycodone     Comment: Xanax about every two or three days; Suboxone strips about every couple days or so, not often   Sexual activity: Yes    Partners: Male    Birth control/protection: None, I.U.D.  Other Topics Concern   Not on file  Social History Narrative   Not on file   Social Drivers of Health   Financial Resource Strain: Not on file  Food Insecurity: No Food Insecurity (10/09/2023)   Hunger Vital Sign    Worried About Running Out of Food in the Last Year: Never true    Ran Out of Food in the Last Year: Never true  Transportation Needs: No Transportation Needs (10/09/2023)   PRAPARE - Administrator, Civil Service (Medical): No    Lack of Transportation (Non-Medical): No  Physical Activity: Not on file  Stress: Not on file  Social Connections: Not on file   Past Medical History:  Past Medical History:  Diagnosis Date   Anxiety 10/22/2017   Chlamydia infection during pregnancy 02/03/2017   Deliberate self-cutting 10/22/2017   Age [age]   Dyspnea    Morbid obesity (HCC) 06/01/2017   Postpartum depression 10/22/2017    Past Surgical History:  Procedure Laterality Date   CESAREAN SECTION N/A 08/02/2017  Procedure: CESAREAN SECTION;  Surgeon: Connell Davies, MD;  Location: [facility] ORS;  Service: Obstetrics;  Laterality: N/A;   TONSILLECTOMY     Family History:  Family History  Problem Relation Age of Onset   Diabetes Mother    Thyroid  disease Mother    Cirrhosis Mother    Healthy Father     Hospital Course:   During hospitalization, the patient was started on Vraylar , which she tolerated well. The dose was increased to 3 mg daily for improved  therapeutic effect. Melatonin 3 mg PO QHS was also initiated to support sleep. Patient denied medication side effects, and there were none noted.  Over the course of treatment, her mood stabilized and sleep improved. She engaged appropriately with staff and peers, demonstrated insight, and expressed motivation to continue treatment after discharge. There were no observed self-harm or aggressive behaviors, and patient consistently denied suicidal or homicidal ideation prior to discharge.  Psychiatric Diagnosis Bipolar I Disorder, current episode depressed, without psychotic features Generalized Anxiety Disorder History of Substance Use Disorder, in early remission Rule out Postpartum Depression (by history) Insomnia (resolved with treatment)   Medications at Discharge Vraylar  3 mg PO daily Melatonin 3 mg PO at bedtime  Risk Assessment Suicide Risk: Low at discharge (no SI, good insight, follow-up in place)    Protective Factors Insight into illness Strong motivation for recovery Supportive mother and housing Willingness to engage in outpatient therapy Absence of psychosis Medication compliance and engagement in care  Risk Factors Prior suicide plan and substance use relapse Recent bereavement Lack of prior outpatient continuity History of akathisia from past medications Unstructured time contributing to rumination  Discharge Plan Discharged to home with supportive mother Plan for outpatient psychiatric follow-up Patient motivated to begin therapy post-discharge  Encouraged to discuss substance use triggers and emotional safetyDetailed risk assessment is complete based on clinical exam and individual risk factors and acute suicide risk is low and acute violence risk is low.     Currently, all modifiable risk of harm to self/harm to others have been addressed and patient is no longer appropriate for the acute inpatient setting and is able to continue treatment for mental  health needs in the community with the supports as indicated below.  Patient is educated and verbalized understanding of discharge plan of care including medications, follow-up appointments, mental health resources and further crisis services in the community.  He is instructed to call 911 or present to the nearest emergency room should he experience any decompensation in mood, disturbance of bowel or return of suicidal/homicidal ideations.  Patient verbalizes understanding of this education and agrees to this plan of care       Psychiatric Specialty Exam:  Presentation  General Appearance:  Appropriate for Environment; Casual  Eye Contact: Good  Speech: Normal Rate  Speech Volume: Normal    Mood and Affect  Mood: Euthymic  Affect: Congruent   Thought Process  Thought Processes: Coherent  Descriptions of Associations:Intact  Orientation:Full (Time, Place and Person)  Thought Content:Logical  Hallucinations:Hallucinations: None  Ideas of Reference:None  Suicidal Thoughts:Suicidal Thoughts: No  Homicidal Thoughts:Homicidal Thoughts: No   Sensorium  Memory: Immediate Good; Remote Good  Judgment: Fair  Insight: Fair   Art therapist  Concentration: Good  Attention Span: Fair  Recall: Fair  Fund of Knowledge: Fair  Language: Fair   Psychomotor Activity  Psychomotor Activity: Psychomotor Activity: Normal  Musculoskeletal: Strength & Muscle Tone: within normal limits Gait & Station: normal Assets  Assets: Manufacturing systems engineer; Housing; Physical  Health; Social Support; Resilience   Sleep  Sleep: Sleep: Fair    Physical Exam: Physical Exam ROS Blood pressure 112/86, pulse 80, temperature 98.2 F (36.8 C), resp. rate (!) 21, height 5' 1 (1.549 m), weight 99.3 kg, SpO2 99%. Body mass index is 41.38 kg/m.   Social History   Tobacco Use  Smoking Status Never  Smokeless Tobacco Current  Tobacco Comments   Vaping  nicotine      Blood Alcohol level:  Lab Results  Component Value Date   [facility] <15 10/07/2023   ETH <10 11/28/2021    Metabolic Disorder Labs:  Lab Results  Component Value Date   HGBA1C 4.4 (L) 10/10/2023   MPG 79.58 10/10/2023   MPG 76.71 12/01/2021   No results found for: PROLACTIN Lab Results  Component Value Date   CHOL 163 10/10/2023   TRIG 66 10/10/2023   HDL 42 10/10/2023   CHOLHDL 3.9 10/10/2023   VLDL 13 10/10/2023   LDLCALC 108 (H) 10/10/2023   LDLCALC 83 12/01/2021    See Psychiatric Specialty Exam and Suicide Risk Assessment completed by Attending Physician prior to discharge.  Discharge destination:  Home  Is patient on multiple antipsychotic therapies at discharge:  No   Has Patient had three or more failed trials of antipsychotic monotherapy by history:  No  Recommended Plan for Multiple Antipsychotic Therapies: NA  Discharge Instructions     Diet - low sodium heart healthy   Complete by: As directed    Increase activity slowly   Complete by: As directed       Allergies as of 10/14/2023       Reactions   Amoxicillin Other (See Comments)   Unknown reaction, childhood allergy        Medication List     STOP taking these medications    ARIPiprazole  10 MG tablet Commonly known as: ABILIFY    escitalopram  10 MG tablet Commonly known as: LEXAPRO    lamoTRIgine 25 MG tablet Commonly known as: LAMICTAL   nicotine  21 mg/24hr patch Commonly known as: NICODERM CQ  - dosed in mg/24 hours       TAKE these medications      Indication  cariprazine  3 MG capsule Commonly known as: VRAYLAR  Take 1 capsule (3 mg total) by mouth daily.  Indication: MIXED BIPOLAR AFFECTIVE DISORDER        Follow-up Information     [facility] up.   Why: Your appointment is scheduled for 10/20/23 at 10 AM in office. Please arrive by 9:45 AM. Contact information: [address]  [address] [place] [address] [phone]                     Signed: Hoy CHRISTELLA Pinal, NP 10/14/2023, 3:09 PM

## 2024-05-22 ENCOUNTER — Ambulatory Visit
Admission: EM | Admit: 2024-05-22 | Discharge: 2024-05-22 | Disposition: A | Discharge status: Home / Self Care | Payer: MEDICAID | Source: Home / Self Care

## 2024-05-22 DIAGNOSIS — J069 Acute upper respiratory infection, unspecified: Secondary | ICD-10-CM | POA: Diagnosis not present

## 2024-05-22 DIAGNOSIS — R509 Fever, unspecified: Secondary | ICD-10-CM

## 2024-05-22 LAB — POC COVID19/FLU A&B COMBO
Covid Antigen, POC: NEGATIVE
Influenza A Antigen, POC: NEGATIVE
Influenza B Antigen, POC: NEGATIVE

## 2024-05-22 MED ORDER — OSELTAMIVIR PHOSPHATE 75 MG PO CAPS: 75.0000 mg | ORAL_CAPSULE | Freq: Two times a day (BID) | ORAL | 0 refills | Status: AC

## 2024-05-22 MED ORDER — AZELASTINE HCL 0.1 % NA SOLN: 1.0000 | Freq: Two times a day (BID) | NASAL | 0 refills | Status: AC

## 2024-05-22 MED ORDER — PROMETHAZINE-DM 6.25-15 MG/5ML PO SYRP: 5.0000 mL | ORAL_SOLUTION | Freq: Four times a day (QID) | ORAL | 0 refills | Status: AC | PRN

## 2024-05-22 NOTE — Discharge Instructions (Signed)
 Your COVID and flu testing was negative, however I am suspicious for the flu to be causing her symptoms or another similar virus.  In case it is the flu, I have sent over the antiviral medication Tamiflu  if you would like to take this.  I have also sent over a nasal spray and a cough syrup.  You may also take over-the-counter cold and congestion medications, ibuprofen  and Tylenol  as needed, drink plenty of fluids, get lots of rest.
